# Patient Record
Sex: Female | Born: 1970
Health system: Southern US, Community
[De-identification: ages and names within clinical notes are randomized; demographics above are authoritative.]

## PROBLEM LIST (undated history)

## (undated) DIAGNOSIS — E039 Hypothyroidism, unspecified: Secondary | ICD-10-CM

## (undated) DIAGNOSIS — E119 Type 2 diabetes mellitus without complications: Secondary | ICD-10-CM

## (undated) DIAGNOSIS — E079 Disorder of thyroid, unspecified: Secondary | ICD-10-CM

## (undated) DIAGNOSIS — I10 Essential (primary) hypertension: Secondary | ICD-10-CM

## (undated) DIAGNOSIS — K769 Liver disease, unspecified: Secondary | ICD-10-CM

## (undated) HISTORY — DX: Type 2 diabetes mellitus without complications: E11.9

## (undated) HISTORY — DX: Essential (primary) hypertension: I10

## (undated) HISTORY — DX: Disorder of thyroid, unspecified: E07.9

---

## 1996-10-10 HISTORY — PX: OTHER SURGICAL HISTORY: SHX169

## 2007-05-10 DIAGNOSIS — E039 Hypothyroidism, unspecified: Secondary | ICD-10-CM | POA: Insufficient documentation

## 2009-02-27 DIAGNOSIS — K760 Fatty (change of) liver, not elsewhere classified: Secondary | ICD-10-CM | POA: Insufficient documentation

## 2009-07-28 DIAGNOSIS — J301 Allergic rhinitis due to pollen: Secondary | ICD-10-CM | POA: Insufficient documentation

## 2010-01-11 ENCOUNTER — Ambulatory Visit: Payer: Self-pay | Admitting: Family Medicine

## 2010-01-11 DIAGNOSIS — S9030XA Contusion of unspecified foot, initial encounter: Secondary | ICD-10-CM | POA: Insufficient documentation

## 2016-01-15 ENCOUNTER — Ambulatory Visit
Admission: RE | Admit: 2016-01-15 | Discharge: 2016-01-15 | Disposition: A | Discharge status: Home / Self Care | Payer: Managed Care, Other (non HMO) | Source: Ambulatory Visit | Attending: Physician Assistant | Admitting: Physician Assistant

## 2016-01-15 ENCOUNTER — Ambulatory Visit (INDEPENDENT_AMBULATORY_CARE_PROVIDER_SITE_OTHER): Payer: Managed Care, Other (non HMO) | Admitting: Physician Assistant

## 2016-01-15 ENCOUNTER — Encounter: Payer: Self-pay | Admitting: Physician Assistant

## 2016-01-15 ENCOUNTER — Telehealth: Payer: Self-pay

## 2016-01-15 VITALS — BP 130/80 | HR 70 | Temp 98.1°F | Resp 16 | Wt 279.4 lb

## 2016-01-15 DIAGNOSIS — E039 Hypothyroidism, unspecified: Secondary | ICD-10-CM

## 2016-01-15 DIAGNOSIS — R1030 Lower abdominal pain, unspecified: Secondary | ICD-10-CM

## 2016-01-15 DIAGNOSIS — E559 Vitamin D deficiency, unspecified: Secondary | ICD-10-CM | POA: Insufficient documentation

## 2016-01-15 DIAGNOSIS — R0683 Snoring: Secondary | ICD-10-CM | POA: Insufficient documentation

## 2016-01-15 NOTE — Progress Notes (Signed)
Patient: Kristin Chavez Female    DOB: 11/09/1970   45 y.o.   MRN: [id number] Visit Date: 01/15/2016  Today's Provider: Margaretann Loveless, PA-C   Chief Complaint  Patient presents with  . Abdominal Pain   Subjective:    Abdominal Pain This is a new problem. The current episode started more than 1 month ago. The problem occurs every several days. Duration: Yesterday it started at 10 pm until 3 am. The problem has been gradually worsening. The pain is located in the epigastric region, RLQ and LLQ (Most of the time is on the lower abdomen but yesterday it radiated to her epigastric region). The pain is at a severity of 6/10. The pain is moderate. The quality of the pain is cramping (feel Bloated and when it went to the Gi area it was burning). The abdominal pain radiates to the back, epigastric region, RLQ and LLQ. Associated symptoms include belching and nausea (during the spells). Pertinent negatives include no constipation, diarrhea, fever or vomiting. Nothing aggravates the pain. Relieved by: Just a warm shower. She has tried antacids for the symptoms. The treatment provided no relief.  Per patient she had this pains and was told she had a fatty liver and that she needed to change her diet. She is not doing a lot of carbs or processed food. Her diet consist of more healthy foods. She is doing a vegan shakes. She is exercising 6 days a week 30-60 minutes mostly cardio. She has lost 25 lbs in the last two months. She had been doing whey protein but was having similar symptoms in the morning instead of evening so she discontinued whey shakes and started vegan shakes.      Allergies no known allergies Previous Medications   LEVOTHYROXINE (SYNTHROID, LEVOTHROID) 200 MCG TABLET    Take by mouth.    Review of Systems  Constitutional: Positive for chills. Negative for fever.  Respiratory: Negative.   Cardiovascular: Negative for chest pain, palpitations and leg swelling.    Gastrointestinal: Positive for nausea (during the spells), abdominal pain and abdominal distention. Negative for vomiting, diarrhea and constipation.  Neurological: Negative.     Social History  Substance Use Topics  . Smoking status: Never Smoker   . Smokeless tobacco: Not on file  . Alcohol Use: No   Objective:   BP 130/80 mmHg  Pulse 70  Temp(Src) 98.1 F (36.7 C) (Oral)  Resp 16  Wt 279 lb 6.4 oz (126.735 kg)  LMP 12/30/2015  Physical Exam  Constitutional: She is oriented to person, place, and time. She appears well-developed and well-nourished. No distress.  Cardiovascular: Normal rate, regular rhythm and normal heart sounds.  Exam reveals no gallop and no friction rub.   No murmur heard. Pulmonary/Chest: Effort normal and breath sounds normal. No respiratory distress. She has no wheezes. She has no rales.  Abdominal: Soft. Normal appearance and bowel sounds are normal. She exhibits no distension and no mass. There is no hepatosplenomegaly. There is tenderness in the right lower quadrant and left lower quadrant. There is no rebound, no guarding, no CVA tenderness, no tenderness at McBurney's point and negative Caton's sign.  Neurological: She is alert and oriented to person, place, and time.  Skin: Skin is warm and dry. She is not diaphoretic.        Assessment & Plan:     1. Lower abdominal pain Will check labs as below. Will f/u pending lab results. Will also get  xray to check for stool burden as cause and will f/u pending results. Advised to start keeping a log of when an attack happens with what she ate that day to see if any foods correlate to symptoms. I do feel it is diet related since symptoms onset with drastic diet changes. Will f/u in 2-4 weeks. She is to call if symptoms worsen. - CBC with Differential - Comprehensive Metabolic Panel (CMET) - Amylase - Lipase - DG Abd 2 Views; Future  2. Hypothyroidism, unspecified hypothyroidism type Will recheck TSH  since it has not been checked in a while. Will f/u pending results.  - TSH       Margaretann LovelessJennifer M Burnette, PA-C  [facility]

## 2016-01-15 NOTE — Telephone Encounter (Signed)
LMTCB  Thanks,  -Jerrika Ledlow 

## 2016-01-15 NOTE — Telephone Encounter (Signed)
Patient advised as directed below.  Thanks,  -Zonia Caplin 

## 2016-01-15 NOTE — Telephone Encounter (Signed)
-----   Message from Margaretann LovelessJennifer M Burnette, [place]PA-C sent at 01/15/2016  1:54 PM EDT ----- There is a moderate stool burden noted throughout the colon indicating constipation. This may or may not be the cause of the abdominal pain. Over the next 2 weeks I want you to use miralax daily as directed on the bottle directions. Continue to keep a log as we have discussed on foods during attack. I should have labs back tomorrow and will notify of those results. I will see you back in 2 weeks.

## 2016-01-15 NOTE — Patient Instructions (Signed)

## 2016-01-15 NOTE — Telephone Encounter (Signed)
Pt returned call. Pt already has her 2 week f/u appt scheduled for 01/29/16. Thanks TNP

## 2016-01-16 ENCOUNTER — Emergency Department
Admission: EM | Admit: 2016-01-16 | Discharge: 2016-01-16 | Disposition: A | Discharge status: Home / Self Care | Payer: Managed Care, Other (non HMO) | Attending: Emergency Medicine | Admitting: Emergency Medicine

## 2016-01-16 ENCOUNTER — Encounter: Payer: Self-pay | Admitting: Emergency Medicine

## 2016-01-16 DIAGNOSIS — R1084 Generalized abdominal pain: Secondary | ICD-10-CM | POA: Diagnosis not present

## 2016-01-16 DIAGNOSIS — K760 Fatty (change of) liver, not elsewhere classified: Secondary | ICD-10-CM | POA: Diagnosis not present

## 2016-01-16 DIAGNOSIS — E039 Hypothyroidism, unspecified: Secondary | ICD-10-CM | POA: Insufficient documentation

## 2016-01-16 DIAGNOSIS — R109 Unspecified abdominal pain: Secondary | ICD-10-CM | POA: Diagnosis present

## 2016-01-16 LAB — COMPREHENSIVE METABOLIC PANEL
ALK PHOS: 60 IU/L (ref 39–117)
ALT: 19 IU/L (ref 0–32)
ALT: 20 U/L (ref 14–54)
AST: 15 IU/L (ref 0–40)
AST: 19 U/L (ref 15–41)
Albumin/Globulin Ratio: 1.6 (ref 1.2–2.2)
Albumin: 4.4 g/dL (ref 3.5–5.5)
Albumin: 4 g/dL (ref 3.5–5.0)
Alkaline Phosphatase: 49 U/L (ref 38–126)
Anion gap: 5 (ref 5–15)
BUN / CREAT RATIO: 16 (ref 9–23)
BUN: 11 mg/dL (ref 6–24)
BUN: 15 mg/dL (ref 6–20)
Bilirubin Total: 0.5 mg/dL (ref 0.0–1.2)
CHLORIDE: 101 mmol/L (ref 96–106)
CHLORIDE: 106 mmol/L (ref 101–111)
CO2: 26 mmol/L (ref 18–29)
CO2: 26 mmol/L (ref 22–32)
CREATININE: 0.68 mg/dL (ref 0.57–1.00)
CREATININE: 0.73 mg/dL (ref 0.44–1.00)
Calcium: 9.6 mg/dL (ref 8.7–10.2)
Calcium: 9.2 mg/dL (ref 8.9–10.3)
GFR calc Af Amer: 122 mL/min/{1.73_m2} (ref >59)
GFR calc non Af Amer: 106 mL/min/{1.73_m2} (ref >59)
GFR calc non Af Amer: >60 mL/min (ref >60)
GLUCOSE: 99 mg/dL (ref 65–99)
GLUCOSE: 118 mg/dL — AB (ref 65–99)
Globulin, Total: 2.7 g/dL (ref 1.5–4.5)
Potassium: 5.3 mmol/L — ABNORMAL HIGH (ref 3.5–5.2)
Potassium: 4.1 mmol/L (ref 3.5–5.1)
Sodium: 141 mmol/L (ref 134–144)
Sodium: 137 mmol/L (ref 135–145)
Total Bilirubin: 0.8 mg/dL (ref 0.3–1.2)
Total Protein: 7.1 g/dL (ref 6.0–8.5)
Total Protein: 7.3 g/dL (ref 6.5–8.1)

## 2016-01-16 LAB — CBC WITH DIFFERENTIAL/PLATELET
BASOS ABS: 0.1 10*3/uL (ref 0.0–0.2)
Basos: 1 %
EOS (ABSOLUTE): 0.8 10*3/uL — ABNORMAL HIGH (ref 0.0–0.4)
Eos: 10 %
Hematocrit: 37.9 % (ref 34.0–46.6)
Hemoglobin: 12.2 g/dL (ref 11.1–15.9)
Immature Grans (Abs): 0 10*3/uL (ref 0.0–0.1)
Immature Granulocytes: 0 %
LYMPHS ABS: 2.1 10*3/uL (ref 0.7–3.1)
Lymphs: 27 %
MCH: 26.8 pg (ref 26.6–33.0)
MCHC: 32.2 g/dL (ref 31.5–35.7)
MCV: 83 fL (ref 79–97)
MONOCYTES: 7 %
MONOS ABS: 0.6 10*3/uL (ref 0.1–0.9)
Neutrophils Absolute: 4.4 10*3/uL (ref 1.4–7.0)
Neutrophils: 55 %
Platelets: 332 10*3/uL (ref 150–379)
RBC: 4.56 x10E6/uL (ref 3.77–5.28)
RDW: 15 % (ref 12.3–15.4)
WBC: 8 10*3/uL (ref 3.4–10.8)

## 2016-01-16 LAB — CBC
HCT: 35 % (ref 35.0–47.0)
Hemoglobin: 11.6 g/dL — ABNORMAL LOW (ref 12.0–16.0)
MCH: 26.9 pg (ref 26.0–34.0)
MCHC: 33 g/dL (ref 32.0–36.0)
MCV: 81.5 fL (ref 80.0–100.0)
Platelets: 271 10*3/uL (ref 150–440)
RBC: 4.3 MIL/uL (ref 3.80–5.20)
RDW: 14.8 % — ABNORMAL HIGH (ref 11.5–14.5)
WBC: 9.2 10*3/uL (ref 3.6–11.0)

## 2016-01-16 LAB — TSH: TSH: 3.66 u[IU]/mL (ref 0.450–4.500)

## 2016-01-16 LAB — URINALYSIS COMPLETE WITH MICROSCOPIC (ARMC ONLY)
Bacteria, UA: NONE SEEN
Bilirubin Urine: NEGATIVE
Glucose, UA: NEGATIVE mg/dL
Hgb urine dipstick: NEGATIVE
Leukocytes, UA: NEGATIVE
Nitrite: NEGATIVE
PH: 7 (ref 5.0–8.0)
PROTEIN: NEGATIVE mg/dL
RBC / HPF: NONE SEEN RBC/hpf (ref 0–5)
Specific Gravity, Urine: 1.024 (ref 1.005–1.030)

## 2016-01-16 LAB — LIPASE: Lipase: 17 U/L (ref 0–59)

## 2016-01-16 LAB — AMYLASE: AMYLASE: 24 U/L — AB (ref 31–124)

## 2016-01-16 LAB — LIPASE, BLOOD: Lipase: 17 U/L (ref 11–51)

## 2016-01-16 MED ORDER — OXYCODONE-ACETAMINOPHEN 5-325 MG PO TABS
1.0000 | ORAL_TABLET | ORAL | Status: DC | PRN
  Administered 2016-01-16: 1 via ORAL

## 2016-01-16 MED ORDER — DICYCLOMINE HCL 20 MG PO TABS: 20.0000 mg | ORAL_TABLET | Freq: Three times a day (TID) | ORAL | Status: DC | PRN

## 2016-01-16 MED ORDER — OXYCODONE-ACETAMINOPHEN 5-325 MG PO TABS
ORAL_TABLET | ORAL | Status: AC
  Filled 2016-01-16: qty 1

## 2016-01-16 NOTE — ED Provider Notes (Signed)
[facility] Emergency Department Provider Note  ____________________________________________  Time seen: 6:50 PM  I have reviewed the triage vital signs and the nursing notes.   HISTORY  Chief Complaint Abdominal Pain    HPI Kristin Chavez is a 45 y.o. female who complains of episodes of generalized abdominal pain for the past 6 weeks. They usually happen about every other day. Last for 5 or 6 hours at a time. Also some nausea but no vomiting or diarrhea. No chest pain or shortness of breath. No fevers or chills. Doesn't seem to be related to eating. 2 months ago she drastically changed her diet cut out all process fluids and carbohydrates and sugar and has been having a diet high on fruits and vegetables. Makes herself special shakes in the morning with protein powder and take some vitamin supplements.     Past Medical History  Diagnosis Date  . Thyroid disease      Patient Active Problem List   Diagnosis Date Noted  . Snores 01/15/2016  . Avitaminosis D 01/15/2016  . Contusion of foot 01/11/2010  . Hay fever 07/28/2009  . Fatty metamorphosis of liver 02/27/2009  . Adiposity 07/11/2008  . Adult hypothyroidism 05/10/2007     Past Surgical History  Procedure Laterality Date  . Hx of laparoscopy  1998     Current Outpatient Rx  Name  Route  Sig  Dispense  Refill  . dicyclomine (BENTYL) 20 MG tablet   Oral   Take 1 tablet (20 mg total) by mouth 3 (three) times daily as needed (for abdominal pain).   30 tablet   0   . levothyroxine (SYNTHROID, LEVOTHROID) 200 MCG tablet   Oral   Take by mouth.            Allergies Review of patient's allergies indicates no known allergies.   Family History  Problem Relation Age of Onset  . Hypertension Mother   . Cancer Paternal Grandmother     Social History Social History  Substance Use Topics  . Smoking status: Never Smoker   . Smokeless tobacco: None  . Alcohol Use: No    Review  of Systems  Constitutional:   No fever or chills. No weight changes Eyes:   No vision changes.  ENT:   No sore throat. No rhinorrhea. Cardiovascular:   No chest pain. Respiratory:   No dyspnea or cough. Gastrointestinal:   Generalized abdominal pain without vomiting and diarrhea.  No BRBPR or melena. Genitourinary:   Negative for dysuria or difficulty urinating. Musculoskeletal:   Negative for focal pain or swelling Skin:   Negative for rash. Neurological:   Negative for headaches, focal weakness or numbness.  10-point ROS otherwise negative.  ____________________________________________   PHYSICAL EXAM:  VITAL SIGNS: ED Triage Vitals  Enc Vitals Group     BP 01/16/16 1557 167/39 mmHg     Pulse Rate 01/16/16 1557 72     Resp 01/16/16 1557 18     Temp 01/16/16 1557 97.9 F (36.6 C)     Temp Source 01/16/16 1557 Oral     SpO2 01/16/16 1557 100 %     Weight 01/16/16 1557 275 lb (124.739 kg)     Height 01/16/16 1557 5\' 6"  (1.676 m)     Head Cir --      Peak Flow --      Pain Score 01/16/16 1558 7     Pain Loc --      Pain Edu? --  Excl. in GC? --     Vital signs reviewed, nursing assessments reviewed.   Constitutional:   Alert and oriented. Well appearing and in no distress. Eyes:   No scleral icterus. No conjunctival pallor. PERRL. EOMI ENT   Head:   Normocephalic and atraumatic.   Nose:   No congestion/rhinnorhea. No septal hematoma   Mouth/Throat:   MMM, no pharyngeal erythema. No peritonsillar mass.    Neck:   No stridor. No SubQ emphysema. No meningismus. Hematological/Lymphatic/Immunilogical:   No cervical lymphadenopathy. Cardiovascular:   RRR. Symmetric bilateral radial and DP pulses.  No murmurs.  Respiratory:   Normal respiratory effort without tachypnea nor retractions. Breath sounds are clear and equal bilaterally. No wheezes/rales/rhonchi. Gastrointestinal:   Soft and nontender. Non distended. There is no CVA tenderness.  No rebound,  rigidity, or guarding. Genitourinary:   deferred Musculoskeletal:   Nontender with normal range of motion in all extremities. No joint effusions.  No lower extremity tenderness.  No edema. Neurologic:   Normal speech and language.  CN 2-10 normal. Motor grossly intact. No gross focal neurologic deficits are appreciated.  Skin:    Skin is warm, dry and intact. No rash noted.  No petechiae, purpura, or bullae. Psychiatric:   Mood and affect are normal. ____________________________________________    LABS (pertinent positives/negatives) (all labs ordered are listed, but only abnormal results are displayed) Labs Reviewed  COMPREHENSIVE METABOLIC PANEL - Abnormal; Notable for the following:    Glucose, Bld 118 (*)    All other components within normal limits  CBC - Abnormal; Notable for the following:    Hemoglobin 11.6 (*)    RDW 14.8 (*)    All other components within normal limits  URINALYSIS COMPLETEWITH MICROSCOPIC ([facility] ONLY) - Abnormal; Notable for the following:    Color, Urine YELLOW (*)    APPearance CLEAR (*)    Ketones, ur 1+ (*)    Squamous Epithelial / LPF 0-5 (*)    All other components within normal limits  LIPASE, BLOOD   ____________________________________________   EKG    ____________________________________________    RADIOLOGY    ____________________________________________   PROCEDURES   ____________________________________________   INITIAL IMPRESSION / ASSESSMENT AND PLAN / ED COURSE  Pertinent labs & imaging results that were available during my care of the patient were reviewed by me and considered in my medical decision making (see chart for details).  Patient presents with generalized abdominal pain episodes. This is chronic in nature and seems to be temporally related to a drastic change in her diet. It may be that the high-fiber load is to go for her GI system to adjust to.Considering the patient's symptoms, medical history, and  physical examination today, I have low suspicion for cholecystitis or biliary pathology, pancreatitis, perforation or bowel obstruction, hernia, intra-abdominal abscess, AAA or dissection, volvulus or intussusception, mesenteric ischemia, or appendicitis.  Labs completely unremarkable. Vital signs unremarkable, exam is benign and reassuring. A prescribe her Bentyl to take as needed for cramping pain, follow-up with primary care, counseled on elimination diet starting with removing all nutritional supplements and protein powders and vitamins.     ____________________________________________   FINAL CLINICAL IMPRESSION(S) / ED DIAGNOSES  Final diagnoses:  Generalized abdominal pain      Sharman Cheek, MD 01/16/16 1925

## 2016-01-16 NOTE — ED Notes (Signed)
[facility] with family: Patient has been having intermittent starting in the lower abdomen with a burning sensation to the mid abdomen and pain across mid back, with 'excessive burping'. Patient states this has been going on for 6 weeks and has been seen by her PCP with subsequent plain film DG ABD xrays. PCP informed her that she had a buildup of stool and began a miralax regimen.

## 2016-01-16 NOTE — ED Notes (Signed)
Pt reports pain medicine has not relieved her pain

## 2016-01-16 NOTE — Discharge Instructions (Signed)

## 2016-01-18 ENCOUNTER — Telehealth: Payer: Self-pay

## 2016-01-18 DIAGNOSIS — E039 Hypothyroidism, unspecified: Secondary | ICD-10-CM

## 2016-01-18 MED ORDER — LEVOTHYROXINE SODIUM 200 MCG PO TABS: 200.0000 ug | ORAL_TABLET | Freq: Every day | ORAL | Status: DC

## 2016-01-18 NOTE — Telephone Encounter (Signed)
Patient advised as directed below.  Thanks,  -Kebrina Friend 

## 2016-01-18 NOTE — Telephone Encounter (Signed)
Patient advised as directed below. Per patient went to the ER this weekend. Also wants to know if she still needs to keep taking her Levothyroxine since her labs were normal and if yes she needs another prescription. Please advise.  Thanks,  -Vaughan Garfinkle

## 2016-01-18 NOTE — Telephone Encounter (Signed)
-----   Message from Margaretann LovelessJennifer M Burnette, PA-C sent at 01/18/2016  8:28 AM EDT ----- All labs are within normal limits and stable.  Thanks! -JB

## 2016-01-18 NOTE — Telephone Encounter (Signed)
Yes she needs to continue levothyroxine and this was refilled. I read her note from her ER visit. Eliminate items as directed and f/u with me as previously scheduled especially if symptoms continue.

## 2016-01-29 ENCOUNTER — Encounter: Payer: Self-pay | Admitting: Physician Assistant

## 2016-01-29 ENCOUNTER — Ambulatory Visit (INDEPENDENT_AMBULATORY_CARE_PROVIDER_SITE_OTHER): Payer: Managed Care, Other (non HMO) | Admitting: Physician Assistant

## 2016-01-29 VITALS — BP 142/90 | HR 71 | Temp 98.7°F | Resp 16 | Wt 275.2 lb

## 2016-01-29 DIAGNOSIS — K760 Fatty (change of) liver, not elsewhere classified: Secondary | ICD-10-CM

## 2016-01-29 DIAGNOSIS — R1030 Lower abdominal pain, unspecified: Secondary | ICD-10-CM

## 2016-01-29 NOTE — Progress Notes (Signed)
Patient ID: Kristin Chavez Iezzi, female   DOB: 07/16/1971, 45 y.o.   MRN: [id number]   Patient: Kristin Chavez [place]in Female    DOB: 07/16/1971   45 y.o.   MRN: [id number] Visit Date: 01/29/2016  Today's Provider: Margaretann LovelessJennifer M Ismeal Heider, PA-C   Chief Complaint  Patient presents with  . Abdominal Pain  . Follow-up   Subjective:    HPI  Kristin RiddleHolly Chavez is a 45 yr old female that returns today for f/u of her lower abdominal pain. Since previous visit she was also seen in the ER on 01/16/16 and it was felt that her abdominal pain was secondary to dietary changes including a high-fiber, high-protein diet.  She was given bentyl for the abdominal pain. Abdominal xray was positive for stool and possible phleboliths. Otherwise unremarkable.  The current episode started more than 2 months ago. The problem occurs every several days. When the episodes happen they last anywhere from 30 minutes to hours. The worst one lasted 8 hours and that is the one she went to the ER for. The problem has been gradually improving (intensity but not frequency) with discontinuing all supplements and protein shakes.. The pain is located in the lower abdomen mostly, but occasionally has radiated to epigastric region. . The pain is at a severity of 6/10 at its worst recently. The quality of the pain is cramping and bloated.  When it radiates to the epigastric region it takes on a burning quality. The abdominal pain radiates to the back, epigastric region, RLQ and LLQ. Associated symptoms include belching and nausea (during the spells). Pertinent negatives include no constipation, diarrhea, fever or vomiting. Nothing aggravates the pain. Relieved by: A warm shower and bentyl when needed. She has tried antacids for the symptoms as well that provided no relief.   A while back she was having abdominal pain similar in quality, but not as severe.  She had an abdominal US and was told she had a fatty liver and that she needed to change her diet. She is not  doing a lot of carbs or processed food. Her diet consist of more healthy foods. She is doing a vegan shakes. She is exercising 6 days a week 30-60 minutes mostly cardio. She has lost 25 lbs in the last two months. She had been doing whey protein but was having similar symptoms in the morning instead of evening so she discontinued whey shakes and started vegan shakes. She is currently not doing any supplements and noticed a decrease in severity but abdominal pain and cramping episodes still occur.   She does report a history of IBS in her sister, but that was when her sister was in her late teen years. No issues since. She does report a slight increase in stress since November because of her 2 sons. Otherwise no changes.  She also has pertinent family history of her father having pre-cancerous polyps removed during a colonoscopy. Of note also she only recently restarted her levothyroxine back as well.    Previous Medications   DICYCLOMINE (BENTYL) 20 MG TABLET    Take 1 tablet (20 mg total) by mouth 3 (three) times daily as needed (for abdominal pain).   LEVOTHYROXINE (SYNTHROID, LEVOTHROID) 200 MCG TABLET    Take 1 tablet (200 mcg total) by mouth daily before breakfast.   No Known Allergies  Review of Systems  Constitutional: Negative for fever, chills, activity change, appetite change, fatigue and unexpected weight change.  HENT: Negative.   Respiratory: Negative.  Cardiovascular: Negative.   Gastrointestinal: Positive for nausea (during episode), abdominal pain (lower abdomen mostly), diarrhea and constipation (occasional; flips between diarrhea and constipation; had loose stool this a.m.). Negative for vomiting, blood in stool, anal bleeding and rectal pain.  Genitourinary: Negative.   Musculoskeletal: Positive for back pain (during episode).  Neurological: Negative.   Psychiatric/Behavioral: Negative.     Social History  Substance Use Topics  . Smoking status: Never Smoker   .  Smokeless tobacco: Not on file  . Alcohol Use: No   Objective:   BP 142/90 mmHg  Pulse 71  Temp(Src) 98.7 F (37.1 C) (Oral)  Resp 16  Wt 275 lb 3.2 oz (124.83 kg)  LMP 12/30/2015  Physical Exam  Constitutional: She appears well-developed and well-nourished. No distress.  Neck: Normal range of motion. Neck supple. No tracheal deviation present. No thyromegaly present.  Cardiovascular: Normal rate, regular rhythm and normal heart sounds.  Exam reveals no gallop and no friction rub.   No murmur heard. Pulmonary/Chest: Effort normal and breath sounds normal. No respiratory distress. She has no wheezes. She has no rales.  Abdominal: Soft. Bowel sounds are normal. She exhibits no distension and no mass. There is no tenderness. There is no rebound and no guarding.  Lymphadenopathy:    She has no cervical adenopathy.  Skin: She is not diaphoretic.  Vitals reviewed.       Assessment & Plan:     1. Lower abdominal pain Continue Bentyl during flares and avoiding nutritional supplements and shakes. Will refer to GI for further evaluation of abdominal pain and fatty liver. Will follow up after GI appointment. She is to call if symptoms worsen before appointment or if she develops any acute issue, question or concern. - Ambulatory referral to Gastroenterology  2. Fatty metamorphosis of liver See above medical treatment plan. - Ambulatory referral to Gastroenterology   Follow up: No Follow-up on file.

## 2016-01-29 NOTE — Patient Instructions (Signed)
Abdominal Pain, Adult °Many things can cause abdominal pain. Usually, abdominal pain is not caused by a disease and will improve without treatment. It can often be observed and treated at home. Your health care provider will do a physical exam and possibly order blood tests and X-rays to help determine the seriousness of your pain. However, in many cases, more time must pass before a clear cause of the pain can be found. Before that point, your health care provider may not know if you need more testing or further treatment. °HOME CARE INSTRUCTIONS °Monitor your abdominal pain for any changes. The following actions may help to alleviate any discomfort you are experiencing: °· Only take over-the-counter or prescription medicines as directed by your health care provider. °· Do not take laxatives unless directed to do so by your health care provider. °· Try a clear liquid diet (broth, tea, or water) as directed by your health care provider. Slowly move to a bland diet as tolerated. °SEEK MEDICAL CARE IF: °· You have unexplained abdominal pain. °· You have abdominal pain associated with nausea or diarrhea. °· You have pain when you urinate or have a bowel movement. °· You experience abdominal pain that wakes you in the night. °· You have abdominal pain that is worsened or improved by eating food. °· You have abdominal pain that is worsened with eating fatty foods. °· You have a fever. °SEEK IMMEDIATE MEDICAL CARE IF: °· Your pain does not go away within 2 hours. °· You keep throwing up (vomiting). °· Your pain is felt only in portions of the abdomen, such as the right side or the left lower portion of the abdomen. °· You pass bloody or black tarry stools. °MAKE SURE YOU: °· Understand these instructions. °· Will watch your condition. °· Will get help right away if you are not doing well or get worse. °  °This information is not intended to replace advice given to you by your health care provider. Make sure you discuss  any questions you have with your health care provider. °  °Document Released: 07/06/2005 Document Revised: 06/17/2015 Document Reviewed: 06/05/2013 °Elsevier Interactive Patient Education ©2016 [employer] ° °Fatty Liver °Fatty liver, also called hepatic steatosis or steatohepatitis, is a condition in which too much fat has built up in your liver cells. The liver removes harmful substances from your bloodstream. It produces fluids your body needs. It also helps your body use and store energy from the food you eat. °In many cases, fatty liver does not cause symptoms or problems. It is often diagnosed when tests are being done for other reasons. However, over time, fatty liver can cause inflammation that may lead to more serious liver problems, such as scarring of the liver (cirrhosis). °CAUSES  °Causes of fatty liver may include:  °· Drinking too much alcohol. °· Poor nutrition. °· Obesity. °· Cushing syndrome. °· Diabetes. °· Hyperlipidemia. °· Pregnancy. °· Certain drugs. °· Poisons. °· Some viral infections. °RISK FACTORS °You may be more likely to develop fatty liver if you: °· Abuse alcohol. °· Are pregnant. °· Are overweight. °· Have diabetes. °· Have hepatitis. °· Have a high triglyceride level.   °SIGNS AND SYMPTOMS  °Fatty liver often does not cause any symptoms. In cases where symptoms develop, they can include: °· Fatigue. °· Weakness. °· Weight loss. °· Confusion.   °· Abdominal pain. °· Yellowing of your skin and the white parts of your eyes (jaundice). °· Nausea and vomiting. °DIAGNOSIS  °Fatty liver may be diagnosed   by:  °· Physical exam and medical history. °· Blood tests. °· Imaging tests, such as an ultrasound, CT scan, or MRI. °· Liver biopsy. A small sample of liver tissue is removed using a needle. The sample is then looked at under a microscope. °TREATMENT  °Fatty liver is often caused by other health conditions. Treatment for fatty liver may involve medicines and lifestyle changes to manage  conditions such as:  °· Alcoholism. °· High cholesterol. °· Diabetes. °· Being overweight or obese.   °HOME CARE INSTRUCTIONS °· Eat a healthy diet as directed by your health care provider. °· Exercise regularly. This can help you lose weight and control your cholesterol and diabetes. Talk to your health care provider about an exercise plan and which activities are best for you. °· Do not drink alcohol.   °· Take medicines only as directed by your health care provider. °SEEK MEDICAL CARE IF: °You have difficulty controlling your: °· Blood sugar. °· Cholesterol. °· Alcohol consumption. °SEEK IMMEDIATE MEDICAL CARE IF: °· You have abdominal pain. °· You have jaundice. °· You have nausea and vomiting. °  °This information is not intended to replace advice given to you by your health care provider. Make sure you discuss any questions you have with your health care provider. °  °Document Released: 11/11/2005 Document Revised: 10/17/2014 Document Reviewed: 02/05/2014 °Elsevier Interactive Patient Education ©2016 Elsevier Inc. ° °

## 2016-02-29 ENCOUNTER — Ambulatory Visit (INDEPENDENT_AMBULATORY_CARE_PROVIDER_SITE_OTHER): Payer: Managed Care, Other (non HMO) | Admitting: Physician Assistant

## 2016-02-29 ENCOUNTER — Encounter: Payer: Self-pay | Admitting: Physician Assistant

## 2016-02-29 VITALS — BP 122/80 | HR 85 | Temp 98.0°F | Resp 16 | Wt 266.4 lb

## 2016-02-29 DIAGNOSIS — L0292 Furuncle, unspecified: Secondary | ICD-10-CM | POA: Diagnosis not present

## 2016-02-29 MED ORDER — CEPHALEXIN 500 MG PO CAPS: 500.0000 mg | ORAL_CAPSULE | Freq: Two times a day (BID) | ORAL | Status: DC

## 2016-02-29 NOTE — Progress Notes (Signed)
Patient: Kristin Chavez Female    DOB: May 06, 1971   45 y.o.   MRN: [id number] Visit Date: 02/29/2016  Today's Provider: Margaretann LovelessJennifer M Burnette, PA-C   Chief Complaint  Patient presents with  . Sore on stomach   Subjective:    HPI   Kristin Chavez is here today with c/o of a sore in her lower right abdomen and swollen lymph node in the right groin. She notice the area 1 week ago. She reports that it now is bigger, purple with redness around it. Is not tender but it feels like it burns,warm to touch. No fever, nausea or vomiting. She has been putting warm compresses on it in the morning and at night for two days. Last night she put warm compress and noticed a little drainage this morning.  Drainage was purulent and clear without odor. She also mentions the lymph node that she had noticed is smaller today as well.  Of note she does report her stomach issues are slightly improved. She uses the bentyl to relieve the cramping. She states when she has an episode it does take 2-3 bentyl to calm it down but the episodes are less frequent. She is scheduled for a colonoscopy tomorrow. She does notice a correlation to red meats and abdominal cramping and diarrhea.     No Known Allergies Previous Medications   DICYCLOMINE (BENTYL) 20 MG TABLET    Take 1 tablet (20 mg total) by mouth 3 (three) times daily as needed (for abdominal pain).   LEVOTHYROXINE (SYNTHROID, LEVOTHROID) 200 MCG TABLET    Take 1 tablet (200 mcg total) by mouth daily before breakfast.    Review of Systems  Constitutional: Negative.   Respiratory: Negative.   Cardiovascular: Negative for chest pain, palpitations and leg swelling.  Gastrointestinal: Negative.   Musculoskeletal: Negative.   Skin: Positive for wound.    Social History  Substance Use Topics  . Smoking status: Never Smoker   . Smokeless tobacco: Not on file  . Alcohol Use: No   Objective:   BP 122/80 mmHg  Pulse 85  Temp(Src) 98 F (36.7 C) (Oral)   Resp 16  Wt 266 lb 6.4 oz (120.838 kg)  Physical Exam  Constitutional: She appears well-developed and well-nourished. No distress.  Neck: Normal range of motion. Neck supple.  Cardiovascular: Normal rate, regular rhythm and normal heart sounds.  Exam reveals no gallop and no friction rub.   No murmur heard. Pulmonary/Chest: Effort normal and breath sounds normal. No respiratory distress. She has no wheezes. She has no rales.  Abdominal: Soft. Bowel sounds are normal. She exhibits no distension and no mass. There is no tenderness. There is no rebound and no guarding.  Lymphadenopathy:    She has no cervical adenopathy.  Skin: Skin is warm and dry. Lesion noted. She is not diaphoretic.     Vitals reviewed.       Assessment & Plan:     1. Boil Discussed I&D but because it is auto-draining slightly will start with keflex first. Advised to continue warm compresses and epsom salt soaks. She is to call if it does not improve or if it worsens and becomes tender to touch and we will I&D at that time. She agrees. - cephALEXin (KEFLEX) 500 MG capsule; Take 1 capsule (500 mg total) by mouth 2 (two) times daily.  Dispense: 20 capsule; Refill: 0       Margaretann LovelessJennifer M Burnette, PA-C  [facility]

## 2016-02-29 NOTE — Patient Instructions (Signed)

## 2016-03-31 ENCOUNTER — Encounter: Payer: Self-pay | Admitting: *Deleted

## 2016-04-01 ENCOUNTER — Encounter: Payer: Self-pay | Admitting: *Deleted

## 2016-04-01 ENCOUNTER — Encounter
Admission: RE | Disposition: A | Discharge status: Home / Self Care | Payer: Self-pay | Source: Ambulatory Visit | Attending: Gastroenterology

## 2016-04-01 ENCOUNTER — Ambulatory Visit
Admission: RE | Admit: 2016-04-01 | Discharge: 2016-04-01 | Disposition: A | Discharge status: Home / Self Care | Payer: Managed Care, Other (non HMO) | Source: Ambulatory Visit | Attending: Gastroenterology | Admitting: Gastroenterology

## 2016-04-01 ENCOUNTER — Ambulatory Visit: Payer: Managed Care, Other (non HMO) | Admitting: Certified Registered"

## 2016-04-01 DIAGNOSIS — E039 Hypothyroidism, unspecified: Secondary | ICD-10-CM | POA: Diagnosis not present

## 2016-04-01 DIAGNOSIS — K635 Polyp of colon: Secondary | ICD-10-CM | POA: Insufficient documentation

## 2016-04-01 DIAGNOSIS — Z79899 Other long term (current) drug therapy: Secondary | ICD-10-CM | POA: Diagnosis not present

## 2016-04-01 DIAGNOSIS — R1030 Lower abdominal pain, unspecified: Secondary | ICD-10-CM | POA: Diagnosis not present

## 2016-04-01 DIAGNOSIS — R194 Change in bowel habit: Secondary | ICD-10-CM | POA: Diagnosis not present

## 2016-04-01 DIAGNOSIS — Z8371 Family history of colonic polyps: Secondary | ICD-10-CM | POA: Insufficient documentation

## 2016-04-01 HISTORY — DX: Liver disease, unspecified: K76.9

## 2016-04-01 HISTORY — DX: Hypothyroidism, unspecified: E03.9

## 2016-04-01 HISTORY — PX: COLONOSCOPY WITH PROPOFOL: SHX5780

## 2016-04-01 LAB — SURGICAL PATHOLOGY

## 2016-04-01 LAB — POCT PREGNANCY, URINE: Preg Test, Ur: NEGATIVE

## 2016-04-01 SURGERY — COLONOSCOPY WITH PROPOFOL
Anesthesia: General

## 2016-04-01 MED ORDER — PROPOFOL 500 MG/50ML IV EMUL
INTRAVENOUS | Status: DC | PRN
  Administered 2016-04-01: 150 ug/kg/min via INTRAVENOUS

## 2016-04-01 MED ORDER — PROPOFOL 10 MG/ML IV BOLUS
INTRAVENOUS | Status: DC | PRN
  Administered 2016-04-01: 70 mg via INTRAVENOUS

## 2016-04-01 MED ORDER — SODIUM CHLORIDE 0.9 % IV SOLN
INTRAVENOUS | Status: DC
  Administered 2016-04-01: 1000 mL via INTRAVENOUS

## 2016-04-01 MED ORDER — MIDAZOLAM HCL 2 MG/2ML IJ SOLN
INTRAMUSCULAR | Status: DC | PRN
  Administered 2016-04-01: 1 mg via INTRAVENOUS

## 2016-04-01 MED ORDER — SODIUM CHLORIDE 0.9 % IV SOLN
INTRAVENOUS | Status: DC
  Administered 2016-04-01: 16:00:00 via INTRAVENOUS

## 2016-04-01 MED ORDER — LIDOCAINE HCL (CARDIAC) 20 MG/ML IV SOLN
INTRAVENOUS | Status: DC | PRN
  Administered 2016-04-01: 100 mg via INTRAVENOUS

## 2016-04-01 NOTE — Anesthesia Preprocedure Evaluation (Signed)
Anesthesia Evaluation  Patient identified by MRN, date of birth, ID band Patient awake    Reviewed: Allergy & Precautions, H&P , NPO status , Patient's Chart, lab work & pertinent test results, reviewed documented beta blocker date and time   Airway Mallampati: II   Neck ROM: full    Dental  (+) Teeth Intact   Pulmonary neg pulmonary ROS,    Pulmonary exam normal        Cardiovascular negative cardio ROS Normal cardiovascular exam Rhythm:regular Rate:Normal     Neuro/Psych negative neurological ROS  negative psych ROS   GI/Hepatic negative GI ROS, Neg liver ROS,   Endo/Other  negative endocrine ROSHypothyroidism   Renal/GU negative Renal ROS  negative genitourinary   Musculoskeletal   Abdominal   Peds  Hematology negative hematology ROS (+)   Anesthesia Other Findings Past Medical History:   Thyroid disease                                              Thyroid disease                                              Liver disease, chronic                                       Hypothyroidism                                             Past Surgical History:   Hx of laparoscopy                                1998         CESAREAN SECTION                                            BMI    Body Mass Index   41.66 kg/m 2     Reproductive/Obstetrics                             Anesthesia Physical Anesthesia Plan  ASA: II  Anesthesia Plan: General   Post-op Pain Management:    Induction:   Airway Management Planned:   Additional Equipment:   Intra-op Plan:   Post-operative Plan:   Informed Consent: I have reviewed the patients History and Physical, chart, labs and discussed the procedure including the risks, benefits and alternatives for the proposed anesthesia with the patient or authorized representative who has indicated his/her understanding and acceptance.   Dental Advisory  Given  Plan Discussed with: CRNA  Anesthesia Plan Comments:         Anesthesia Quick Evaluation

## 2016-04-01 NOTE — H&P (Signed)
Outpatient short stay form Pre-procedure 04/01/2016 4:21 PM Christena DeemMartin U Partick Musselman MD  Primary Physician: Eliezer BottomJennifer Burnett PA  Reason for visit:  Colonoscopy  History of present illness:  Patient is a 45 year old female presenting today as above. She has a family history of colon polyps and a primary relative, father. He also has been having some recent fecal urgency at times as well as lower abdominal pain. There's been some change of bowel habits is noted. She tolerated her prep well. She takes no blood thinning medications or aspirin products.    Current facility-administered medications:  .  0.9 %  sodium chloride infusion, , Intravenous, Continuous, Christena DeemMartin U Lakeesha Fontanilla, MD, Last Rate: 20 mL/hr at 04/01/16 1531, 1,000 mL at 04/01/16 1531 .  0.9 %  sodium chloride infusion, , Intravenous, Continuous, Christena DeemMartin U Emine Lopata, MD  Prescriptions prior to admission  Medication Sig Dispense Refill Last Dose  . levothyroxine (SYNTHROID, LEVOTHROID) 200 MCG tablet Take 1 tablet (200 mcg total) by mouth daily before breakfast. 90 tablet 1 04/01/2016 at Unknown time  . cephALEXin (KEFLEX) 500 MG capsule Take 1 capsule (500 mg total) by mouth 2 (two) times daily. 20 capsule 0   . dicyclomine (BENTYL) 20 MG tablet Take 1 tablet (20 mg total) by mouth 3 (three) times daily as needed (for abdominal pain). 30 tablet 0 Taking     No Known Allergies   Past Medical History  Diagnosis Date  . Thyroid disease   . Thyroid disease   . Liver disease, chronic   . Hypothyroidism     Review of systems:      Physical Exam    Heart and lungs: Regular rate and rhythm without rub or gallop, lungs are bilaterally clear.    HEENT: Normocephalic atraumatic eyes are anicteric    Other:     Pertinant exam for procedure: Soft nontender nondistended bowel sounds positive normoactive.    Planned proceedures: Colonoscopy and Indicated procedures. I have discussed the risks benefits and complications of procedures to  include not limited to bleeding, infection, perforation and the risk of sedation and the patient wishes to proceed.   Christena DeemMartin U Yaileen Hofferber, MD Gastroenterology 04/01/2016  4:21 PM

## 2016-04-01 NOTE — Transfer of Care (Signed)
Immediate Anesthesia Transfer of Care Note  Patient: Kristin Chavez  Procedure(s) Performed: Procedure(s): COLONOSCOPY WITH PROPOFOL (N/A)  Patient Location: PACU  Anesthesia Type:General  Level of Consciousness: sedated and responds to stimulation  Airway & Oxygen Therapy: Patient Spontanous Breathing and Patient connected to nasal cannula oxygen  Post-op Assessment: Report given to RN and Post -op Vital signs reviewed and stable  Post vital signs: Reviewed and stable  Last Vitals:  Filed Vitals:   04/01/16 1510 04/01/16 1652  BP: 167/79 126/69  Pulse: 69 73  Temp: 37.1 C   Resp: 20 17    Last Pain: There were no vitals filed for this visit.       Complications: No apparent anesthesia complications

## 2016-04-01 NOTE — Op Note (Signed)
[facility] Gastroenterology Patient Name: Kristin RiddleHolly Wilshire Procedure Date: 04/01/2016 4:24 PM MRN: [id number] Account #: [id number] Date of Birth: 07-Mar-1971 Admit Type: Outpatient Age: 45 [facility]OM 4 Gender: Female Note Status: Finalized Procedure:            Colonoscopy Indications:          Family history of colonic polyps in a first-degree                        relative Providers:            Christena DeemMartin U. Tamirah George, MD Referring MD:         No Local Md, MD (Referring MD) Medicines:            Monitored Anesthesia Care Complications:        No immediate complications. Procedure:            Pre-Anesthesia Assessment:                       - ASA Grade Assessment: II - A patient with mild                        systemic disease.                       After obtaining informed consent, the colonoscope was                        passed under direct vision. Throughout the procedure,                        the patient's blood pressure, pulse, and oxygen                        saturations were monitored continuously. The                        Colonoscope was introduced through the anus and                        advanced to the the cecum, identified by appendiceal                        orifice and ileocecal valve. The colonoscopy was                        performed with moderate difficulty. The patient                        tolerated the procedure well. The quality of the bowel                        preparation was good. Findings:      A 4 mm polyp was found in the proximal ascending colon. The polyp was       pedunculated. The polyp was removed with a cold snare. Resection and       retrieval were complete. To prevent bleeding after the polypectomy, one       hemostatic clip was successfully placed. There was no bleeding at the       end of the maneuver.  The digital rectal exam was normal.      The exam was otherwise without abnormality. Impression:            - One 4 mm polyp in the proximal ascending colon,                        removed with a cold snare. Resected and retrieved. Clip                        was placed.                       - The examination was otherwise normal. Recommendation:       - Discharge patient to home.                       - Use Citrucel one tablespoon PO daily.                       - Return to GI clinic in 4 weeks. Procedure Code(s):    --- Professional ---                       [phone], Colonoscopy, flexible; with removal of tumor(s),                        polyp(s), or other lesion(s) by snare technique Diagnosis Code(s):    --- Professional ---                       D12.2, Benign neoplasm of ascending colon                       Z83.71, Family history of colonic polyps CPT copyright 2016 American Medical Association. All rights reserved. The codes documented in this report are preliminary and upon coder review may  be revised to meet current compliance requirements. Christena DeemMartin U Lisbet Busker, MD 04/01/2016 4:52:34 PM This report has been signed electronically. Number of Addenda: 0 Note Initiated On: 04/01/2016 4:24 PM Scope Withdrawal Time: 0 hours 4 minutes 24 seconds  Total Procedure Duration: 0 hours 19 minutes 39 seconds       [facility]

## 2016-04-05 ENCOUNTER — Encounter: Payer: Self-pay | Admitting: Gastroenterology

## 2016-04-05 NOTE — Anesthesia Postprocedure Evaluation (Signed)
Anesthesia Post Note  Patient: Kristin Chavez  Procedure(s) Performed: Procedure(s) (LRB): COLONOSCOPY WITH PROPOFOL (N/A)  Patient location during evaluation: PACU Anesthesia Type: General Level of consciousness: awake and alert Pain management: pain level controlled Vital Signs Assessment: post-procedure vital signs reviewed and stable Respiratory status: spontaneous breathing, nonlabored ventilation, respiratory function stable and patient connected to nasal cannula oxygen Cardiovascular status: blood pressure returned to baseline and stable Postop Assessment: no signs of nausea or vomiting Anesthetic complications: no    Last Vitals:  Filed Vitals:   04/01/16 1712 04/01/16 1722  BP: 118/97 133/91  Pulse: 59 59  Temp:    Resp: 17 19    Last Pain: There were no vitals filed for this visit.               Yevette EdwardsJames G Tanaiya Kolarik

## 2016-06-21 ENCOUNTER — Encounter: Payer: Self-pay | Admitting: Family Medicine

## 2016-06-21 ENCOUNTER — Ambulatory Visit (INDEPENDENT_AMBULATORY_CARE_PROVIDER_SITE_OTHER): Payer: Managed Care, Other (non HMO) | Admitting: Family Medicine

## 2016-06-21 VITALS — BP 116/90 | HR 61 | Temp 98.8°F | Resp 15 | Wt 251.0 lb

## 2016-06-21 DIAGNOSIS — L564 Polymorphous light eruption: Secondary | ICD-10-CM

## 2016-06-21 DIAGNOSIS — M25572 Pain in left ankle and joints of left foot: Secondary | ICD-10-CM | POA: Diagnosis not present

## 2016-06-21 DIAGNOSIS — R1030 Lower abdominal pain, unspecified: Secondary | ICD-10-CM

## 2016-06-21 MED ORDER — PREDNISONE 20 MG PO TABS: ORAL_TABLET | ORAL | 0 refills | Status: DC

## 2016-06-21 MED ORDER — DICYCLOMINE HCL 20 MG PO TABS: 20.0000 mg | ORAL_TABLET | Freq: Three times a day (TID) | ORAL | 0 refills | Status: DC | PRN

## 2016-06-21 NOTE — Progress Notes (Signed)
Subjective:     Patient ID: Kristin Chavez, female   DOB: 1971-01-30, 45 y.o.   MRN: [id number]  HPI  Chief Complaint  Patient presents with  . Skin Problem    Patient comes in office today with concerns of sun poisoning. Patient reports that on Friday she was out in the sun for hours and skin has now turned red on her upper torso  . Ankle Pain    Patient would like to address intermittent ankle pain since Feb. Patient reports walking 2 miles a day and states that she has pain in the left ankile in various spots. Patient has been taking otc Aleve for pain  States this is the photosensitive eruption she has had in the past with dermatology evaluation. States that it is currently the inner aspect of her left ankle that is troubling her with w.b. Also would like refill on Bentyl for abdominal pain she associates with eating red meat. Has had prior G.I. Evaluation.   Review of Systems     Objective:   Physical Exam  Constitutional: She appears well-developed and well-nourished. No distress.  Cardiovascular:  Pulses:      Dorsalis pedis pulses are 2+ on the left side.       Posterior tibial pulses are 2+ on the left side.  Musculoskeletal:  Left ankle with mild medial malleolar tenderness. DF/PF 5/5. Ankle and foot ligaments stable without pain on testing  Skin:  Redness and mild swelling of her face noted       Assessment:    1. Ankle pain, left  2. Polymorphous light eruption - predniSONE (DELTASONE) 20 MG tablet; Taper as follows: 20 mg. Twice daily  Dispense: 14 tablet; Refill: 0  3. Lower abdominal pain - dicyclomine (BENTYL) 20 MG tablet; Take 1 tablet (20 mg total) by mouth 3 (three) times daily as needed (for abdominal pain).  Dispense: 30 tablet; Refill: 0    Plan:    Discussed use of ankle sleeve and cross training for a week. If pain improve start walking program slowly increasing distance. Consider podiatry if not improving.

## 2016-06-21 NOTE — Patient Instructions (Signed)
Discussed use of ankle sleeve. Consider cross training for a week. When you resume your walking gradually work up to 2 miles again.

## 2016-07-04 ENCOUNTER — Other Ambulatory Visit: Payer: Self-pay | Admitting: Family Medicine

## 2016-07-04 ENCOUNTER — Telehealth: Payer: Self-pay | Admitting: Physician Assistant

## 2016-07-04 DIAGNOSIS — M25572 Pain in left ankle and joints of left foot: Secondary | ICD-10-CM

## 2016-07-04 NOTE — Telephone Encounter (Signed)
Pt was in a couple of weeks to see Nadine CountsBob about her left foot pain.  Nadine CountsBob told her if it continued he could refer her to poditrist.  She wants to go ahead and a referral.  She has [employer] ins.  She would like to go to [facility] [address]  Her call back is [phone]  Thank sTeri

## 2016-07-04 NOTE — Telephone Encounter (Signed)
Referral in progress. 

## 2016-07-04 NOTE — Telephone Encounter (Signed)
Referral request. KW  

## 2016-07-08 ENCOUNTER — Encounter: Payer: Self-pay | Admitting: Podiatry

## 2016-07-08 ENCOUNTER — Ambulatory Visit (INDEPENDENT_AMBULATORY_CARE_PROVIDER_SITE_OTHER): Payer: Managed Care, Other (non HMO)

## 2016-07-08 ENCOUNTER — Ambulatory Visit (INDEPENDENT_AMBULATORY_CARE_PROVIDER_SITE_OTHER): Payer: Managed Care, Other (non HMO) | Admitting: Podiatry

## 2016-07-08 VITALS — BP 154/97 | HR 72 | Resp 16

## 2016-07-08 DIAGNOSIS — M79672 Pain in left foot: Secondary | ICD-10-CM

## 2016-07-08 DIAGNOSIS — L609 Nail disorder, unspecified: Secondary | ICD-10-CM | POA: Diagnosis not present

## 2016-07-08 DIAGNOSIS — M778 Other enthesopathies, not elsewhere classified: Secondary | ICD-10-CM

## 2016-07-08 DIAGNOSIS — M79609 Pain in unspecified limb: Secondary | ICD-10-CM

## 2016-07-08 DIAGNOSIS — L608 Other nail disorders: Secondary | ICD-10-CM

## 2016-07-08 DIAGNOSIS — M7752 Other enthesopathy of left foot: Secondary | ICD-10-CM

## 2016-07-08 DIAGNOSIS — B351 Tinea unguium: Secondary | ICD-10-CM

## 2016-07-08 DIAGNOSIS — M779 Enthesopathy, unspecified: Secondary | ICD-10-CM

## 2016-07-08 DIAGNOSIS — L603 Nail dystrophy: Secondary | ICD-10-CM

## 2016-07-08 DIAGNOSIS — Z79899 Other long term (current) drug therapy: Secondary | ICD-10-CM

## 2016-07-08 NOTE — Progress Notes (Signed)
   Subjective:    Patient ID: Kristin Chavez, female    DOB: 09-Mar-1971, 45 y.o.   MRN: [id number]  HPI    Review of Systems  All other systems reviewed and are negative.      Objective:   Physical Exam        Assessment & Plan:

## 2016-07-09 LAB — HEPATIC FUNCTION PANEL
ALT: 21 IU/L (ref 0–32)
AST: 16 IU/L (ref 0–40)
Albumin: 4.3 g/dL (ref 3.5–5.5)
Alkaline Phosphatase: 44 IU/L (ref 39–117)
Bilirubin Total: 0.5 mg/dL (ref 0.0–1.2)
Bilirubin, Direct: 0.11 mg/dL (ref 0.00–0.40)
Total Protein: 7.1 g/dL (ref 6.0–8.5)

## 2016-07-09 NOTE — Progress Notes (Signed)
Patient ID: Kristin Chavez, female   DOB: Jan 22, 1971, 45 y.o.   MRN: [id number] Subjective: Patient presents today for possible treatment and evaluation of fungal nails to the right great toe. Patient states that she has noticed the right great toenail becoming dystrophic and yellow with partial detachment from the last several months. Patient presents today for further treatment and evaluation.  Patient also has a complaint of left foot pain diffusely. Patient states that she only notices the pain when she is walking. Patient is an avid walker and walks approximately 2 miles a day.  Objective: Physical Exam General: The patient is alert and oriented x3 in no acute distress.  Dermatology: Hyperkeratotic, discolored, thickened, onychodystrophy of noted to the right great toenail.  Skin is warm, dry and supple bilateral lower extremities. Negative for open lesions or macerations.  Vascular: Palpable pedal pulses bilaterally. No edema or erythema noted. Capillary refill within normal limits.  Neurological: Epicritic and protective threshold grossly intact bilaterally.   Musculoskeletal Exam: Negative for pain on palpation across all pedal and ankle joints of the left lower extremity.  Range of motion within normal limits to all pedal and ankle joints bilateral. Muscle strength 5/5 in all groups bilateral.   Assessment: #1 onychodystrophy right great toenail #2 possible onychomycosis #3 hyperkeratotic toenail right great toe #4 diffuse pain left foot. #5 possible midfoot capsulitis left #6 possible midsubstance plantar fasciitis left  Plan of Care:  #1 Patient was evaluated. #2 Orders for liver function tests were ordered today.  #3 Today nail biopsy was taken and sent to pathology for fungal culture. #5 patient is to return to clinic in 4 weeks to discuss fungal culture nail biopsy findings and LFT results and discuss different treatment options. #6 recommend plantar fascial brace for  left foot pain, to be used when she walks.  Dr. Felecia ShellingBrent M. Evans, DPM [facility]

## 2016-07-11 ENCOUNTER — Other Ambulatory Visit: Payer: Self-pay | Admitting: Family Medicine

## 2016-07-11 ENCOUNTER — Telehealth: Payer: Self-pay | Admitting: Physician Assistant

## 2016-07-11 ENCOUNTER — Telehealth: Payer: Self-pay | Admitting: *Deleted

## 2016-07-11 DIAGNOSIS — R1030 Lower abdominal pain, unspecified: Secondary | ICD-10-CM

## 2016-07-11 MED ORDER — DICYCLOMINE HCL 20 MG PO TABS: 20.0000 mg | ORAL_TABLET | Freq: Three times a day (TID) | ORAL | 0 refills | Status: DC | PRN

## 2016-07-11 MED ORDER — MELOXICAM 15 MG PO TABS: 15.0000 mg | ORAL_TABLET | Freq: Every day | ORAL | 0 refills | Status: DC

## 2016-07-11 NOTE — Telephone Encounter (Signed)
Pt states the pharmacy has not rec'd the Rx refill for dicyclomine (BENTYL) 20 MG tablet.  [facility] [address]  CB#[phone]/MW

## 2016-07-11 NOTE — Telephone Encounter (Signed)
Could not send electronically. See phone in refill request

## 2016-07-11 NOTE — Telephone Encounter (Signed)
Please review. KW 

## 2016-07-11 NOTE — Telephone Encounter (Signed)
Prescription has been called in.KW 

## 2016-07-11 NOTE — Telephone Encounter (Addendum)
Pt states she was to get a pain medication at last visit, but it has not been [facility] to [facility]. Left message informing pt I would inform Dr. Logan BoresEvans, and he would let me know of orders. Left message informing pt Dr. Logan BoresEvans [facility] Mel[facility]

## 2016-07-11 NOTE — Telephone Encounter (Signed)
Yeah we never discussed pain medication on her last visit. Let's just send her in an Rx for Meloxicam15mg  QD #60.  Thanks!

## 2016-07-12 ENCOUNTER — Other Ambulatory Visit: Payer: Self-pay | Admitting: Physician Assistant

## 2016-07-12 DIAGNOSIS — E039 Hypothyroidism, unspecified: Secondary | ICD-10-CM

## 2016-08-05 ENCOUNTER — Ambulatory Visit (INDEPENDENT_AMBULATORY_CARE_PROVIDER_SITE_OTHER): Payer: Managed Care, Other (non HMO) | Admitting: Podiatry

## 2016-08-05 DIAGNOSIS — B351 Tinea unguium: Secondary | ICD-10-CM | POA: Diagnosis not present

## 2016-08-05 DIAGNOSIS — L608 Other nail disorders: Secondary | ICD-10-CM | POA: Diagnosis not present

## 2016-08-05 DIAGNOSIS — M79609 Pain in unspecified limb: Secondary | ICD-10-CM | POA: Diagnosis not present

## 2016-08-05 DIAGNOSIS — L603 Nail dystrophy: Secondary | ICD-10-CM | POA: Diagnosis not present

## 2016-08-05 MED ORDER — NONFORMULARY OR COMPOUNDED ITEM
1.0000 [drp] | Freq: Every day | 2 refills | Status: DC
Start: 2016-08-05 — End: 2018-02-12

## 2016-08-14 NOTE — Progress Notes (Signed)
Patient ID: Kristin Chavez, female   DOB: 1970/11/06, 45 y.o.   MRN: [id number] Subjective: Patient presents today for possible treatment and evaluation of fungal nails to the right great toe. Patient states that she has noticed the right great toenail becoming dystrophic and yellow with partial detachment from the last several months. Patient presents today for further treatment and evaluation.  Patient also has a complaint of left foot pain diffusely. Patient states that she only notices the pain when she is walking. Patient is an avid walker and walks approximately 2 miles a day.  Objective: Physical Exam General: The patient is alert and oriented x3 in no acute distress.  Dermatology: Hyperkeratotic, discolored, thickened, onychodystrophy of noted to the right great toenail.  Skin is warm, dry and supple bilateral lower extremities. Negative for open lesions or macerations.  Vascular: Palpable pedal pulses bilaterally. No edema or erythema noted. Capillary refill within normal limits.  Neurological: Epicritic and protective threshold grossly intact bilaterally.   Musculoskeletal Exam: Negative for pain on palpation across all pedal and ankle joints of the left lower extremity.  Range of motion within normal limits to all pedal and ankle joints bilateral. Muscle strength 5/5 in all groups bilateral.   Assessment: #1 onychodystrophy right great toenail #2 possible onychomycosis #3 hyperkeratotic toenail right great toe #4 diffuse pain left foot. #5 possible midfoot capsulitis left #6 possible midsubstance plantar fasciitis left  Plan of Care:  #1 Patient was evaluated. Fungal nail culture was reviewed positive for fungus. #2 today we discussed in detail the treatment modalities for onychomycosis, including oral, topical, and laser therapy. #3 the patient will on for oral, topical, and laser therapy treatment modalities #4 prescription for Lamisil 250 mg 90 #5 prescription for  [facility]ifungal nail lacquer [facility]6 schedule appointment in 4 weeks with Clifton JamesJessica Quintana [place] for laser therapy #7 patient will require approximately 4 treatment of laser therapy approximately one month  #8  between treatmentreturn to clinic in 4 months Dr. Felecia ShellingBrent M. Levaeh Vice, DPM [facility]

## 2016-09-06 ENCOUNTER — Ambulatory Visit: Payer: Managed Care, Other (non HMO)

## 2016-12-09 ENCOUNTER — Ambulatory Visit: Payer: Managed Care, Other (non HMO) | Admitting: Podiatry

## 2018-02-09 ENCOUNTER — Telehealth: Payer: Self-pay | Admitting: Physician Assistant

## 2018-02-09 NOTE — Telephone Encounter (Signed)
error 

## 2018-02-12 ENCOUNTER — Other Ambulatory Visit: Payer: Self-pay

## 2018-02-12 ENCOUNTER — Ambulatory Visit: Payer: 59 | Admitting: Physician Assistant

## 2018-02-12 ENCOUNTER — Encounter: Payer: Self-pay | Admitting: Physician Assistant

## 2018-02-12 VITALS — BP 144/98 | Ht 66.0 in | Wt 289.0 lb

## 2018-02-12 DIAGNOSIS — E119 Type 2 diabetes mellitus without complications: Secondary | ICD-10-CM | POA: Diagnosis not present

## 2018-02-12 DIAGNOSIS — R1030 Lower abdominal pain, unspecified: Secondary | ICD-10-CM

## 2018-02-12 DIAGNOSIS — Z6841 Body Mass Index (BMI) 40.0 and over, adult: Secondary | ICD-10-CM

## 2018-02-12 DIAGNOSIS — A630 Anogenital (venereal) warts: Secondary | ICD-10-CM | POA: Diagnosis not present

## 2018-02-12 DIAGNOSIS — Z7689 Persons encountering health services in other specified circumstances: Secondary | ICD-10-CM | POA: Diagnosis not present

## 2018-02-12 DIAGNOSIS — K760 Fatty (change of) liver, not elsewhere classified: Secondary | ICD-10-CM | POA: Diagnosis not present

## 2018-02-12 DIAGNOSIS — E039 Hypothyroidism, unspecified: Secondary | ICD-10-CM | POA: Diagnosis not present

## 2018-02-12 DIAGNOSIS — D508 Other iron deficiency anemias: Secondary | ICD-10-CM

## 2018-02-12 MED ORDER — DICYCLOMINE HCL 20 MG PO TABS
20.0000 mg | ORAL_TABLET | Freq: Three times a day (TID) | ORAL | 1 refills | Status: DC | PRN
Start: 2018-02-12 — End: 2019-08-27

## 2018-02-12 NOTE — Progress Notes (Signed)
Patient: Kristin Chavez Female    DOB: March 24, 1971   47 y.o.   MRN: [id number] Visit Date: 02/12/2018  Today's Provider: Margaretann Loveless, PA-C   Chief Complaint  Patient presents with  . Thyroid Problem   Subjective:    Thyroid Problem  Presents for follow-up visit. Symptoms include anxiety, fatigue, hair loss and weight gain. Patient reports no menstrual problem. The symptoms have been worsening.   She has previously been treated for hypothyroidism and was maintained previously on levothyroxine . She discontinued treatment on her own almost 1 year ago.  She is also requesting a refill on Bentyl. She uses this only prn when she gets abdominal cramping associated with stress. Things have been going well in her life so she has not needed this often.   Has complaint of "vaginal bumps" which she thinks is a flare of her genital warts. She has known genital warts and has had to have cryotherapy of her cervix in the past. This is the first outbreak externally.   Also would like labs checked.    No Known Allergies   Current Outpatient Medications:  .  dicyclomine (BENTYL) 20 MG tablet, Take 1 tablet (20 mg total) by mouth 3 (three) times daily as needed (for abdominal pain). (Patient not taking: Reported on 02/12/2018), Disp: 30 tablet, Rfl: 0  Review of Systems  Constitutional: Positive for fatigue, unexpected weight change and weight gain.  HENT: Negative.   Respiratory: Negative.   Cardiovascular: Negative.   Gastrointestinal: Negative.   Genitourinary: Positive for genital sores. Negative for hematuria, menstrual problem, pelvic pain, vaginal bleeding, vaginal discharge and vaginal pain.  Skin:       Warts  Neurological: Negative.   Psychiatric/Behavioral: The patient is nervous/anxious.     Social History   Tobacco Use  . Smoking status: Never Smoker  . Smokeless tobacco: Never Used  Substance Use Topics  . Alcohol use: No   Objective:   BP (!) 144/98    Ht  (1.676 m)   Wt 289 lb (131.1 kg)   LMP 02/07/2018   BMI 46.65 kg/m  Vitals:   02/12/18 0909  BP: (!) 144/98  Weight: 289 lb (131.1 kg)  Height:  (1.676 m)     Physical Exam  Constitutional: She appears well-developed and well-nourished. No distress.  Neck: Normal range of motion. Neck supple. No JVD present. No tracheal deviation present. No thyromegaly present.  Cardiovascular: Normal rate, regular rhythm and normal heart sounds. Exam reveals no gallop and no friction rub.  No murmur heard. Pulmonary/Chest: Effort normal and breath sounds normal. No respiratory distress. She has no wheezes. She has no rales.  Musculoskeletal: She exhibits no edema.  Lymphadenopathy:    She has no cervical adenopathy.  Skin: She is not diaphoretic.  Vitals reviewed.       Assessment & Plan:     1. Adult hypothyroidism Will recheck labs as below to see how off her thyroid is and will then start treatment. I will see her back in 6 weeks for CPE w pap and to recheck thyroid labs.  - CBC w/Diff/Platelet - TSH  2. Lower abdominal pain Stable. Diagnosis pulled for medication refill. Continue current medical treatment plan. Will check labs as below and f/u pending results. - dicyclomine (BENTYL) 20 MG tablet; Take 1 tablet (20 mg total) by mouth 3 (three) times daily as needed (for abdominal pain).  Dispense: 30 tablet; Refill: 1 - CBC w/Diff/Platelet -  Comprehensive Metabolic Panel (CMET) - HgB A1c - Lipid Profile  3. Fatty metamorphosis of liver H/O this. Trying to control with dietary changes. No labs checked in 2 years. Will check labs as below and f/u pending results. - CBC w/Diff/Platelet - Comprehensive Metabolic Panel (CMET) - HgB A1c - Lipid Profile  4. BMI 45.0-49.9, adult [facility]) Counseled patient on healthy lifestyle modifications including dieting and exercise.  Will check labs as below and f/u pending results. - CBC w/Diff/Platelet - Comprehensive Metabolic  Panel (CMET) - HgB A1c - TSH - Lipid Profile  5. Genital warts H/O this. Possible flare. Not wanting pelvic exam today. Will have her return in 6 weeks for CPE w pap and will determine if warts at that time.        Margaretann Loveless, PA-C  [facility]

## 2018-02-12 NOTE — Patient Instructions (Signed)
Hypothyroidism Hypothyroidism is a disorder of the thyroid. The thyroid is a large gland that is located in the lower front of the neck. The thyroid releases hormones that control how the body works. With hypothyroidism, the thyroid does not make enough of these hormones. What are the causes? Causes of hypothyroidism may include:  Viral infections.  Pregnancy.  Your own defense system (immune system) attacking your thyroid.  Certain medicines.  Birth defects.  Past radiation treatments to your head or neck.  Past treatment with radioactive iodine.  Past surgical removal of part or all of your thyroid.  Problems with the gland that is located in the center of your brain (pituitary).  What are the signs or symptoms? Signs and symptoms of hypothyroidism may include:  Feeling as though you have no energy (lethargy).  Inability to tolerate cold.  Weight gain that is not explained by a change in diet or exercise habits.  Dry skin.  Coarse hair.  Menstrual irregularity.  Slowing of thought processes.  Constipation.  Sadness or depression.  How is this diagnosed? Your health care provider may diagnose hypothyroidism with blood tests and ultrasound tests. How is this treated? Hypothyroidism is treated with medicine that replaces the hormones that your body does not make. After you begin treatment, it may take several weeks for symptoms to go away. Follow these instructions at home:  Take medicines only as directed by your health care provider.  If you start taking any new medicines, tell your health care provider.  Keep all follow-up visits as directed by your health care provider. This is important. As your condition improves, your dosage needs may change. You will need to have blood tests regularly so that your health care provider can watch your condition. Contact a health care provider if:  Your symptoms do not get better with treatment.  You are taking thyroid  replacement medicine and: ? You sweat excessively. ? You have tremors. ? You feel anxious. ? You lose weight rapidly. ? You cannot tolerate heat. ? You have emotional swings. ? You have diarrhea. ? You feel weak. Get help right away if:  You develop chest pain.  You develop an irregular heartbeat.  You develop a rapid heartbeat. This information is not intended to replace advice given to you by your health care provider. Make sure you discuss any questions you have with your health care provider. Document Released: 09/26/2005 Document Revised: 03/03/2016 Document Reviewed: 02/11/2014 Elsevier Interactive Patient Education  2018 [employer]vier Inc[employer]  

## 2018-02-13 LAB — CBC WITH DIFFERENTIAL/PLATELET
Basophils Absolute: 0.1 10*3/uL (ref 0.0–0.2)
Basos: 1 %
EOS (ABSOLUTE): 0.2 10*3/uL (ref 0.0–0.4)
EOS: 3 %
HEMATOCRIT: 30.2 % — AB (ref 34.0–46.6)
HEMOGLOBIN: 9.3 g/dL — AB (ref 11.1–15.9)
IMMATURE GRANS (ABS): 0 10*3/uL (ref 0.0–0.1)
Immature Granulocytes: 0 %
Lymphocytes Absolute: 2.1 10*3/uL (ref 0.7–3.1)
Lymphs: 25 %
MCH: 22.3 pg — AB (ref 26.6–33.0)
MCHC: 30.8 g/dL — ABNORMAL LOW (ref 31.5–35.7)
MCV: 72 fL — AB (ref 79–97)
MONOCYTES: 7 %
Monocytes Absolute: 0.6 10*3/uL (ref 0.1–0.9)
NEUTROS ABS: 5.4 10*3/uL (ref 1.4–7.0)
Neutrophils: 64 %
PLATELETS: 458 10*3/uL — AB (ref 150–379)
RBC: 4.17 x10E6/uL (ref 3.77–5.28)
RDW: 16.6 % — ABNORMAL HIGH (ref 12.3–15.4)
WBC: 8.4 10*3/uL (ref 3.4–10.8)

## 2018-02-13 LAB — COMPREHENSIVE METABOLIC PANEL
A/G RATIO: 1.4 (ref 1.2–2.2)
ALBUMIN: 4.2 g/dL (ref 3.5–5.5)
ALT: 14 IU/L (ref 0–32)
AST: 16 IU/L (ref 0–40)
Alkaline Phosphatase: 61 IU/L (ref 39–117)
BILIRUBIN TOTAL: 0.3 mg/dL (ref 0.0–1.2)
BUN/Creatinine Ratio: 14 (ref 9–23)
BUN: 10 mg/dL (ref 6–24)
CHLORIDE: 100 mmol/L (ref 96–106)
CO2: 22 mmol/L (ref 20–29)
Calcium: 9.4 mg/dL (ref 8.7–10.2)
Creatinine, Ser: 0.71 mg/dL (ref 0.57–1.00)
GFR calc non Af Amer: 102 mL/min/{1.73_m2} (ref >59)
GFR, EST AFRICAN AMERICAN: 117 mL/min/{1.73_m2} (ref >59)
GLOBULIN, TOTAL: 3 g/dL (ref 1.5–4.5)
Glucose: 100 mg/dL — ABNORMAL HIGH (ref 65–99)
POTASSIUM: 4.8 mmol/L (ref 3.5–5.2)
SODIUM: 140 mmol/L (ref 134–144)
TOTAL PROTEIN: 7.2 g/dL (ref 6.0–8.5)

## 2018-02-13 LAB — LIPID PANEL
Chol/HDL Ratio: 3.2 ratio (ref 0.0–4.4)
Cholesterol, Total: 189 mg/dL (ref 100–199)
HDL: 59 mg/dL (ref >39)
LDL CALC: 105 mg/dL — AB (ref 0–99)
Triglycerides: 123 mg/dL (ref 0–149)
VLDL CHOLESTEROL CAL: 25 mg/dL (ref 5–40)

## 2018-02-13 LAB — TSH: TSH: 21.79 u[IU]/mL — ABNORMAL HIGH (ref 0.450–4.500)

## 2018-02-13 LAB — HEMOGLOBIN A1C
Est. average glucose Bld gHb Est-mCnc: 146 mg/dL
Hgb A1c MFr Bld: 6.7 % — ABNORMAL HIGH (ref 4.8–5.6)

## 2018-02-14 LAB — FERRITIN: Ferritin: 7 ng/mL — ABNORMAL LOW (ref 15–150)

## 2018-02-14 LAB — IRON AND TIBC
Iron Saturation: 4 % — CL (ref 15–55)
Iron: 18 ug/dL — ABNORMAL LOW (ref 27–159)
TIBC: 460 ug/dL — AB (ref 250–450)
UIBC: 442 ug/dL — AB (ref 131–425)

## 2018-02-14 LAB — SPECIMEN STATUS REPORT

## 2018-02-15 ENCOUNTER — Telehealth: Payer: Self-pay

## 2018-02-15 DIAGNOSIS — E119 Type 2 diabetes mellitus without complications: Secondary | ICD-10-CM | POA: Insufficient documentation

## 2018-02-15 DIAGNOSIS — D508 Other iron deficiency anemias: Secondary | ICD-10-CM | POA: Insufficient documentation

## 2018-02-15 DIAGNOSIS — D509 Iron deficiency anemia, unspecified: Secondary | ICD-10-CM | POA: Insufficient documentation

## 2018-02-15 MED ORDER — LEVOTHYROXINE SODIUM 50 MCG PO TABS: 50.0000 ug | ORAL_TABLET | Freq: Every day | ORAL | 0 refills | Status: DC

## 2018-02-15 NOTE — Telephone Encounter (Signed)
-----   Message from Margaretann Loveless, PA-C sent at 02/15/2018  4:48 PM EDT ----- Iron levels are significantly low with anemia noted. Kidney and liver function are normal. A1c is elevated at 6.7. This is indicative of Diabetes. This could also be why you are feeling poorly. TSH is off at 21.790. Will send in levothyroxine to start. Cholesterol is normal. Recommend really focusing on dietary changes for the elevated A1c. Start ferrous sulfate  twice daily (this will make stools darker in color). We can recheck TSH, A1c, CBC and iron stores in 6-8 weeks.

## 2018-02-15 NOTE — Telephone Encounter (Signed)
Patient advised as below. Patient verbalizes understanding and is in agreement with treatment plan.  

## 2018-02-15 NOTE — Addendum Note (Signed)
Addended by: Margaretann Loveless on: 02/15/2018 04:49 PM   Modules accepted: Orders

## 2018-03-20 ENCOUNTER — Telehealth: Payer: Self-pay | Admitting: Physician Assistant

## 2018-03-20 DIAGNOSIS — D508 Other iron deficiency anemias: Secondary | ICD-10-CM

## 2018-03-20 DIAGNOSIS — E119 Type 2 diabetes mellitus without complications: Secondary | ICD-10-CM

## 2018-03-20 DIAGNOSIS — E039 Hypothyroidism, unspecified: Secondary | ICD-10-CM

## 2018-03-20 NOTE — Telephone Encounter (Signed)
Lab slip printed and placed up front ready for pick up.  Thanks,  -Berdene Askari

## 2018-03-20 NOTE — Telephone Encounter (Signed)
Pt stated that she is due to have her labs rechecked and is requesting a lab slip for the following:  Iron TSH A1C  Please advise. Thanks TNP

## 2018-03-26 ENCOUNTER — Encounter: Payer: Self-pay | Admitting: Physician Assistant

## 2018-05-13 ENCOUNTER — Other Ambulatory Visit: Payer: Self-pay | Admitting: Physician Assistant

## 2018-05-13 DIAGNOSIS — E039 Hypothyroidism, unspecified: Secondary | ICD-10-CM

## 2018-12-04 ENCOUNTER — Encounter: Payer: Self-pay | Admitting: Emergency Medicine

## 2018-12-04 ENCOUNTER — Emergency Department
Admission: EM | Admit: 2018-12-04 | Discharge: 2018-12-04 | Disposition: A | Discharge status: Home / Self Care | Payer: 59 | Attending: Emergency Medicine | Admitting: Emergency Medicine

## 2018-12-04 ENCOUNTER — Other Ambulatory Visit: Payer: Self-pay

## 2018-12-04 ENCOUNTER — Emergency Department: Payer: 59

## 2018-12-04 DIAGNOSIS — R42 Dizziness and giddiness: Secondary | ICD-10-CM | POA: Diagnosis not present

## 2018-12-04 DIAGNOSIS — E119 Type 2 diabetes mellitus without complications: Secondary | ICD-10-CM | POA: Insufficient documentation

## 2018-12-04 DIAGNOSIS — R079 Chest pain, unspecified: Secondary | ICD-10-CM

## 2018-12-04 DIAGNOSIS — E039 Hypothyroidism, unspecified: Secondary | ICD-10-CM | POA: Diagnosis not present

## 2018-12-04 DIAGNOSIS — R0789 Other chest pain: Secondary | ICD-10-CM | POA: Diagnosis not present

## 2018-12-04 DIAGNOSIS — J40 Bronchitis, not specified as acute or chronic: Secondary | ICD-10-CM | POA: Diagnosis not present

## 2018-12-04 LAB — BASIC METABOLIC PANEL
ANION GAP: 10 (ref 5–15)
BUN: 13 mg/dL (ref 6–20)
CALCIUM: 8.9 mg/dL (ref 8.9–10.3)
CO2: 23 mmol/L (ref 22–32)
Chloride: 103 mmol/L (ref 98–111)
Creatinine, Ser: 0.91 mg/dL (ref 0.44–1.00)
GLUCOSE: 153 mg/dL — AB (ref 70–99)
POTASSIUM: 3.6 mmol/L (ref 3.5–5.1)
Sodium: 136 mmol/L (ref 135–145)

## 2018-12-04 LAB — FIBRIN DERIVATIVES D-DIMER (ARMC ONLY): Fibrin derivatives D-dimer (ARMC): 363.74 ng/mL (FEU) (ref 0.00–499.00)

## 2018-12-04 LAB — CBC
HCT: 35.1 % — ABNORMAL LOW (ref 36.0–46.0)
Hemoglobin: 10.7 g/dL — ABNORMAL LOW (ref 12.0–15.0)
MCH: 23.2 pg — ABNORMAL LOW (ref 26.0–34.0)
MCHC: 30.5 g/dL (ref 30.0–36.0)
MCV: 76.1 fL — ABNORMAL LOW (ref 80.0–100.0)
Platelets: 437 10*3/uL — ABNORMAL HIGH (ref 150–400)
RBC: 4.61 MIL/uL (ref 3.87–5.11)
RDW: 17.8 % — ABNORMAL HIGH (ref 11.5–15.5)
WBC: 9.7 10*3/uL (ref 4.0–10.5)
nRBC: 0 % (ref 0.0–0.2)

## 2018-12-04 LAB — GLUCOSE, CAPILLARY: GLUCOSE-CAPILLARY: 134 mg/dL — AB (ref 70–99)

## 2018-12-04 LAB — TROPONIN I: Troponin I: <0.03 ng/mL (ref <0.03)

## 2018-12-04 MED ORDER — SODIUM CHLORIDE 0.9% FLUSH: 3.0000 mL | Freq: Once | INTRAVENOUS | Status: DC

## 2018-12-04 MED ORDER — FERROUS SULFATE DRIED ER 160 (50 FE) MG PO TBCR: 160.0000 mg | EXTENDED_RELEASE_TABLET | ORAL | 6 refills | Status: DC

## 2018-12-04 NOTE — ED Notes (Signed)
ED Provider at bedside. 

## 2018-12-04 NOTE — ED Notes (Signed)
First nurse note: completed a 24 hour fast this am, before eating became dizzy, nauseated and diaphoretic, NAD at this time.

## 2018-12-04 NOTE — ED Triage Notes (Signed)
Fasting 24hour , this AM diaphoretic, dizziness, chest pain, and fatigue

## 2018-12-04 NOTE — ED Provider Notes (Signed)
[facility] Emergency Department Provider Note       Time seen: ----------------------------------------- 9:23 AM on 12/04/2018 -----------------------------------------   I have reviewed the triage vital signs and the nursing notes.  HISTORY   Chief Complaint Chest Pain    HPI Kristin Chavez is a 48 y.o. female with a history of hypothyroidism, liver disease, thyroid disease, who presents to the ED for diaphoresis with dizziness, chest pain and fatigue.  Patient reports she was coming off of a 24-hour fast.  She is also diabetic and did not check her blood sugar, does not know if it dropped or not.  She denies any recent illness or other complaints.  This is not happened to her before.  Past Medical History:  Diagnosis Date  . Hypothyroidism   . Liver disease, chronic   . Thyroid disease   . Thyroid disease     Patient Active Problem List   Diagnosis Date Noted  . Iron deficiency anemia secondary to inadequate dietary iron intake 02/15/2018  . Type 2 diabetes mellitus without complication, without long-term current use of insulin (HCC) 02/15/2018  . Genital warts 02/12/2018  . BMI 45.0-49.9, adult (HCC) 02/12/2018  . 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 Snores 01/15/2016  . Avitaminosis D 01/15/2016  . Contusion of foot 01/11/2010  . Hay fever 07/28/2009  . Fatty metamorphosis of liver 02/27/2009  . Fatty infiltration of liver 02/27/2009  . Adiposity 07/11/2008  . Adult hypothyroidism 05/10/2007    Past Surgical History:  Procedure Laterality Date  . CESAREAN SECTION    . COLONOSCOPY WITH PROPOFOL N/A 04/01/2016   Procedure: COLONOSCOPY WITH PROPOFOL;  Surgeon: Christena Deem, MD;  Location[facility] ENDOSCOPY;  Service: Endoscopy;  Laterality: N/A;  . Hx of laparoscopy  1998    Allergies Patient has no known allergies.  Social History Social History   Tobacco Use  . Smoking status: Never Smoker  . Smokeless tobacco: Never Used   Substance Use Topics  . Alcohol use: No  . Drug use: No   Review of Systems Constitutional: Negative for fever. Cardiovascular: Positive for chest pain Respiratory: Negative for shortness of breath. Gastrointestinal: Negative for abdominal pain, vomiting and diarrhea. Musculoskeletal: Negative for back pain. Skin: Negative for rash. Neurological: Positive for dizziness, weakness  All systems negative/normal/unremarkable except as stated in the HPI  ____________________________________________   PHYSICAL EXAM:  VITAL SIGNS: ED Triage Vitals  Enc Vitals Group     BP 12/04/18 0844 (!) 189/87     Pulse Rate 12/04/18 0844 90     Resp 12/04/18 0844 18     Temp 12/04/18 0844 98.4 F (36.9 C)     Temp Source 12/04/18 0844 Oral     SpO2 12/04/18 0844 96 %     Weight 12/04/18 0841 290 lb (131.5 kg)     Height 12/04/18 0841 5\' 7"  (1.702 m)     Head Circumference --      Peak Flow --      Pain Score 12/04/18 0841 0     Pain Loc --      Pain Edu? --      Excl. in GC? --    Constitutional: Alert and oriented. Well appearing and in no distress. Eyes: Conjunctivae are normal. Normal extraocular movements. ENT      Head: Normocephalic and atraumatic.      Nose: No congestion/rhinnorhea.      Mouth/Throat: Mucous membranes are moist.      Neck: No stridor.  Cardiovascular: Normal rate, regular rhythm. No murmurs, rubs, or gallops. Respiratory: Normal respiratory effort without tachypnea nor retractions. Breath sounds are clear and equal bilaterally. No wheezes/rales/rhonchi. Gastrointestinal: Soft and nontender. Normal bowel sounds Musculoskeletal: Nontender with normal range of motion in extremities. No lower extremity tenderness nor edema. Neurologic:  Normal speech and language. No gross focal neurologic deficits are appreciated.  Skin:  Skin is warm, dry and intact. No rash noted. Psychiatric: Mood and affect are normal. Speech and behavior are normal.   ____________________________________________  EKG: Interpreted by me.  Sinus rhythm the rate 84 bpm, normal PR interval, normal QRS, normal QT  ____________________________________________  ED COURSE:  As part of my medical decision making, I reviewed the following data within the electronic MEDICAL RECORD NUMBER History obtained from family if available, nursing notes, old chart and ekg, as well as notes from prior ED visits. Patient presented for dizziness with diaphoresis, chest pain and fatigue, we will assess with labs and imaging as indicated at this time.   Procedures ____________________________________________   LABS (pertinent positives/negatives)  Labs Reviewed  BASIC METABOLIC PANEL - Abnormal; Notable for the following components:      Result Value   Glucose, Bld 153 (*)    All other components within normal limits  CBC - Abnormal; Notable for the following components:   Hemoglobin 10.7 (*)    HCT 35.1 (*)    MCV 76.1 (*)    MCH 23.2 (*)    RDW 17.8 (*)    Platelets 437 (*)    All other components within normal limits  GLUCOSE, CAPILLARY - Abnormal; Notable for the following components:   Glucose-Capillary 134 (*)    All other components within normal limits  TROPONIN I  FIBRIN DERIVATIVES D-DIMER ([facility] ONLY)  POC URINE PREG, ED    RADIOLOGY Images were viewed by me  Chest x-ray IMPRESSION: Minimal bronchitic changes. ____________________________________________   DIFFERENTIAL DIAGNOSIS   Hypoglycemia, anxiety, arrhythmia, MI, unstable angina, PE  FINAL ASSESSMENT AND PLAN  Dizziness  Plan: The patient had presented for multiple complaints. Patient's labs did not reveal any acute process. Patient's imaging was also negative.  Symptoms are likely multifactorial, she is cleared for outpatient cardiology follow-up.   Ulice Dash, MD    Note: This note was generated in part or whole with voice recognition software. Voice recognition is usually  quite accurate but there are transcription errors that can and very often do occur. I apologize for any typographical errors that were not detected and corrected.     Emily Filbert, MD 12/04/18 [phone]

## 2018-12-08 ENCOUNTER — Other Ambulatory Visit: Payer: Self-pay | Admitting: Physician Assistant

## 2018-12-08 DIAGNOSIS — E039 Hypothyroidism, unspecified: Secondary | ICD-10-CM

## 2019-05-20 NOTE — Progress Notes (Signed)
Patient: Kristin Chavez Female    DOB: Jan 22, 1971   48 y.o.   MRN: [id number] Visit Date: 05/21/2019  Today's Provider: Margaretann LovelessJennifer M Alyxandra Tenbrink, PA-C   Chief Complaint  Patient presents with  . Follow-up  . Hypothyroidism   Subjective:    Virtual Visit via Video Note  I connected with Kristin RingsHolly B Waide on 05/21/19 at  3:40 PM EDT by a video enabled telemedicine application and verified that I am speaking with the correct person using two identifiers.  Location: Patient: Home Provider: [facility]   I discussed the limitations of evaluation and management by telemedicine and the availability of in person appointments. The patient expressed understanding and agreed to proceed.   HPI   Birdie RiddleHolly Byrom is a 48 yr old female that presents today via video visit for follow up of her thyroid. She has not had labs to check her thyroid since May 2019. She is also needing her A1c, renal function and lipids checked as well. She reports feeling more fatigued recently and thinks her thyroid may need to be adjusted again. Otherwise she is feeling well and has no complaints.   No Known Allergies   Current Outpatient Medications:  .  ferrous sulfate (EQL SLOW RELEASE IRON) 160 (50 Fe) MG TBCR SR tablet, Take 1 tablet (160 mg total) by mouth every other day., Disp: 30 each, Rfl: 6 .  levothyroxine (SYNTHROID, LEVOTHROID) 100 MCG tablet, Take 100 mcg by mouth daily before breakfast., Disp: , Rfl:  .  dicyclomine (BENTYL) 20 MG tablet, Take 1 tablet (20 mg total) by mouth 3 (three) times daily as needed (for abdominal pain). (Patient not taking: Reported on 12/04/2018), Disp: 30 tablet, Rfl: 1 .  levothyroxine (SYNTHROID, LEVOTHROID) 50 MCG tablet, TAKE 1 TABLET BY MOUTH EVERY DAY (Patient not taking: Reported on 12/04/2018), Disp: 90 tablet, Rfl: 1  Review of Systems  Constitutional: Positive for fatigue. Negative for appetite change, chills and fever.  Respiratory: Negative for chest tightness and shortness  of breath.   Cardiovascular: Negative for chest pain and palpitations.  Gastrointestinal: Negative for abdominal pain, nausea and vomiting.  Endocrine: Negative for polydipsia, polyphagia and polyuria.  Neurological: Negative for dizziness, weakness and numbness.    Social History   Tobacco Use  . Smoking status: Never Smoker  . Smokeless tobacco: Never Used  Substance Use Topics  . Alcohol use: No      Objective:   There were no vitals taken for this visit. There were no vitals filed for this visit.   Physical Exam Vitals signs reviewed.  Constitutional:      General: She is not in acute distress.    Appearance: Normal appearance. She is well-developed. She is obese. She is not ill-appearing.  HENT:     Head: Normocephalic and atraumatic.  Neck:     Musculoskeletal: Normal range of motion and neck supple.  Pulmonary:     Effort: Pulmonary effort is normal. No respiratory distress.  Neurological:     Mental Status: She is alert.  Psychiatric:        Mood and Affect: Mood normal.        Behavior: Behavior normal.        Thought Content: Thought content normal.        Judgment: Judgment normal.      No results found for any visits on 05/21/19.     Assessment & Plan     1. Fatty metamorphosis of liver H/o this.  Diet controlled. Will check labs as below and f/u pending results. - CBC w/Diff/Platelet - Comprehensive Metabolic Panel (CMET) - Lipid Profile  2. Adult hypothyroidism Reports some fatigue. On Levothyroxine 161mcg. Will check labs as below and f/u pending results. - CBC w/Diff/Platelet - TSH  3. Type 2 diabetes mellitus without complication, without long-term current use of insulin (HCC) Diet controlled. Will check labs as below and f/u pending results. - CBC w/Diff/Platelet - Comprehensive Metabolic Panel (CMET) - HgB A1c  4. Iron deficiency anemia secondary to inadequate dietary iron intake H/O this. Worsens with her menstrual cycle. Tries to  take iron supplement when on menstrual cycle but forgets sometimes. Also with fatigue will check labs and f/u pending results.  - CBC w/Diff/Platelet - Fe+TIBC+Fer  5. Hypercholesteremia H/O this. Diet controlled. Will check labs as below and f/u pending results. - CBC w/Diff/Platelet - Comprehensive Metabolic Panel (CMET) - Lipid Profile   I discussed the assessment and treatment plan with the patient. The patient was provided an opportunity to ask questions and all were answered. The patient agreed with the plan and demonstrated an understanding of the instructions.   The patient was advised to call back or seek an in-person evaluation if the symptoms worsen or if the condition fails to improve as anticipated.  I provided 12 minutes of non-face-to-face time during this encounter.    Mar Daring, PA-C  [facility]

## 2019-05-21 ENCOUNTER — Encounter: Payer: Self-pay | Admitting: Physician Assistant

## 2019-05-21 ENCOUNTER — Telehealth (INDEPENDENT_AMBULATORY_CARE_PROVIDER_SITE_OTHER): Payer: 59 | Admitting: Physician Assistant

## 2019-05-21 DIAGNOSIS — D508 Other iron deficiency anemias: Secondary | ICD-10-CM

## 2019-05-21 DIAGNOSIS — E039 Hypothyroidism, unspecified: Secondary | ICD-10-CM

## 2019-05-21 DIAGNOSIS — E119 Type 2 diabetes mellitus without complications: Secondary | ICD-10-CM | POA: Diagnosis not present

## 2019-05-21 DIAGNOSIS — K760 Fatty (change of) liver, not elsewhere classified: Secondary | ICD-10-CM

## 2019-05-21 DIAGNOSIS — E7800 Pure hypercholesterolemia, unspecified: Secondary | ICD-10-CM

## 2019-08-26 ENCOUNTER — Telehealth: Payer: Self-pay

## 2019-08-26 ENCOUNTER — Encounter: Payer: Self-pay | Admitting: Emergency Medicine

## 2019-08-26 ENCOUNTER — Other Ambulatory Visit: Payer: Self-pay

## 2019-08-26 ENCOUNTER — Emergency Department: Payer: 59

## 2019-08-26 ENCOUNTER — Encounter: Payer: Self-pay | Admitting: Physician Assistant

## 2019-08-26 DIAGNOSIS — E039 Hypothyroidism, unspecified: Secondary | ICD-10-CM | POA: Insufficient documentation

## 2019-08-26 DIAGNOSIS — E119 Type 2 diabetes mellitus without complications: Secondary | ICD-10-CM | POA: Diagnosis not present

## 2019-08-26 DIAGNOSIS — R05 Cough: Secondary | ICD-10-CM | POA: Diagnosis not present

## 2019-08-26 DIAGNOSIS — U071 COVID-19: Secondary | ICD-10-CM | POA: Diagnosis not present

## 2019-08-26 DIAGNOSIS — Z79899 Other long term (current) drug therapy: Secondary | ICD-10-CM | POA: Insufficient documentation

## 2019-08-26 DIAGNOSIS — R0602 Shortness of breath: Secondary | ICD-10-CM | POA: Insufficient documentation

## 2019-08-26 LAB — CBC WITH DIFFERENTIAL/PLATELET
Abs Immature Granulocytes: 0.02 10*3/uL (ref 0.00–0.07)
Basophils Absolute: 0 10*3/uL (ref 0.0–0.1)
Basophils Relative: 0 %
Eosinophils Absolute: 0.1 10*3/uL (ref 0.0–0.5)
Eosinophils Relative: 1 %
HCT: 29.6 % — ABNORMAL LOW (ref 36.0–46.0)
Hemoglobin: 8.7 g/dL — ABNORMAL LOW (ref 12.0–15.0)
Immature Granulocytes: 0 %
Lymphocytes Relative: 19 %
Lymphs Abs: 1 10*3/uL (ref 0.7–4.0)
MCH: 20.9 pg — ABNORMAL LOW (ref 26.0–34.0)
MCHC: 29.4 g/dL — ABNORMAL LOW (ref 30.0–36.0)
MCV: 71.2 fL — ABNORMAL LOW (ref 80.0–100.0)
Monocytes Absolute: 0.4 10*3/uL (ref 0.1–1.0)
Monocytes Relative: 8 %
Neutro Abs: 3.8 10*3/uL (ref 1.7–7.7)
Neutrophils Relative %: 72 %
Platelets: 261 10*3/uL (ref 150–400)
RBC: 4.16 MIL/uL (ref 3.87–5.11)
RDW: 17.8 % — ABNORMAL HIGH (ref 11.5–15.5)
WBC: 5.2 10*3/uL (ref 4.0–10.5)
nRBC: 0 % (ref 0.0–0.2)

## 2019-08-26 LAB — COMPREHENSIVE METABOLIC PANEL
ALT: 11 U/L (ref 0–44)
AST: 17 U/L (ref 15–41)
Albumin: 3.4 g/dL — ABNORMAL LOW (ref 3.5–5.0)
Alkaline Phosphatase: 51 U/L (ref 38–126)
Anion gap: 11 (ref 5–15)
BUN: 10 mg/dL (ref 6–20)
CO2: 25 mmol/L (ref 22–32)
Calcium: 8.7 mg/dL — ABNORMAL LOW (ref 8.9–10.3)
Chloride: 102 mmol/L (ref 98–111)
Creatinine, Ser: 0.69 mg/dL (ref 0.44–1.00)
GFR calc Af Amer: >60 mL/min (ref >60)
GFR calc non Af Amer: >60 mL/min (ref >60)
Glucose, Bld: 146 mg/dL — ABNORMAL HIGH (ref 70–99)
Potassium: 3.7 mmol/L (ref 3.5–5.1)
Sodium: 138 mmol/L (ref 135–145)
Total Bilirubin: 0.6 mg/dL (ref 0.3–1.2)
Total Protein: 7.7 g/dL (ref 6.5–8.1)

## 2019-08-26 LAB — TROPONIN I (HIGH SENSITIVITY): Troponin I (High Sensitivity): 4 ng/L (ref <18)

## 2019-08-26 NOTE — ED Triage Notes (Addendum)
Patient ambulatory to triage with steady gait, without difficulty or distress noted, mask in place; Since Tuesday having prod cough green sputum accomp by [facility] and mid back pain; +COVID on Sunday; pt st "I bought a pulse ox to use at home and it said my oxygen was low so my sister said I should come"; st oxim was 92% at home

## 2019-08-26 NOTE — Telephone Encounter (Signed)
Left message to schedule appointment with a different provider.   Thanks,   -Mickel Baas

## 2019-08-27 ENCOUNTER — Ambulatory Visit (INDEPENDENT_AMBULATORY_CARE_PROVIDER_SITE_OTHER): Payer: 59 | Admitting: Physician Assistant

## 2019-08-27 ENCOUNTER — Encounter (HOSPITAL_COMMUNITY): Payer: Self-pay

## 2019-08-27 ENCOUNTER — Inpatient Hospital Stay (HOSPITAL_COMMUNITY)
Admission: EM | Admit: 2019-08-27 | Discharge: 2019-08-31 | DRG: 177 | Disposition: A | Discharge status: Home / Self Care | Payer: 59 | Source: Other Acute Inpatient Hospital | Attending: Internal Medicine | Admitting: Internal Medicine

## 2019-08-27 ENCOUNTER — Emergency Department
Admission: EM | Admit: 2019-08-27 | Discharge: 2019-08-27 | Disposition: A | Discharge status: Other Acute Inpatient Hospital | Payer: 59 | Attending: Student | Admitting: Student

## 2019-08-27 DIAGNOSIS — R439 Unspecified disturbances of smell and taste: Secondary | ICD-10-CM | POA: Diagnosis present

## 2019-08-27 DIAGNOSIS — J9601 Acute respiratory failure with hypoxia: Secondary | ICD-10-CM | POA: Diagnosis present

## 2019-08-27 DIAGNOSIS — R0602 Shortness of breath: Secondary | ICD-10-CM

## 2019-08-27 DIAGNOSIS — E063 Autoimmune thyroiditis: Secondary | ICD-10-CM | POA: Diagnosis present

## 2019-08-27 DIAGNOSIS — E119 Type 2 diabetes mellitus without complications: Secondary | ICD-10-CM | POA: Diagnosis present

## 2019-08-27 DIAGNOSIS — R05 Cough: Secondary | ICD-10-CM

## 2019-08-27 DIAGNOSIS — K769 Liver disease, unspecified: Secondary | ICD-10-CM | POA: Diagnosis present

## 2019-08-27 DIAGNOSIS — D508 Other iron deficiency anemias: Secondary | ICD-10-CM | POA: Diagnosis present

## 2019-08-27 DIAGNOSIS — U071 COVID-19: Principal | ICD-10-CM | POA: Diagnosis present

## 2019-08-27 DIAGNOSIS — J1282 Pneumonia due to coronavirus disease 2019: Secondary | ICD-10-CM

## 2019-08-27 DIAGNOSIS — Z9119 Patient's noncompliance with other medical treatment and regimen: Secondary | ICD-10-CM | POA: Diagnosis not present

## 2019-08-27 DIAGNOSIS — Z6841 Body Mass Index (BMI) 40.0 and over, adult: Secondary | ICD-10-CM | POA: Diagnosis not present

## 2019-08-27 DIAGNOSIS — Z23 Encounter for immunization: Secondary | ICD-10-CM | POA: Diagnosis not present

## 2019-08-27 DIAGNOSIS — R059 Cough, unspecified: Secondary | ICD-10-CM

## 2019-08-27 DIAGNOSIS — D509 Iron deficiency anemia, unspecified: Secondary | ICD-10-CM | POA: Diagnosis present

## 2019-08-27 DIAGNOSIS — Z8709 Personal history of other diseases of the respiratory system: Secondary | ICD-10-CM | POA: Diagnosis present

## 2019-08-27 DIAGNOSIS — Z8616 Personal history of COVID-19: Secondary | ICD-10-CM | POA: Diagnosis present

## 2019-08-27 DIAGNOSIS — Z809 Family history of malignant neoplasm, unspecified: Secondary | ICD-10-CM | POA: Diagnosis not present

## 2019-08-27 DIAGNOSIS — Z8249 Family history of ischemic heart disease and other diseases of the circulatory system: Secondary | ICD-10-CM

## 2019-08-27 DIAGNOSIS — J1289 Other viral pneumonia: Secondary | ICD-10-CM | POA: Diagnosis present

## 2019-08-27 DIAGNOSIS — N920 Excessive and frequent menstruation with regular cycle: Secondary | ICD-10-CM | POA: Diagnosis present

## 2019-08-27 DIAGNOSIS — Z5329 Procedure and treatment not carried out because of patient's decision for other reasons: Secondary | ICD-10-CM

## 2019-08-27 DIAGNOSIS — E039 Hypothyroidism, unspecified: Secondary | ICD-10-CM | POA: Diagnosis not present

## 2019-08-27 DIAGNOSIS — E66813 Obesity, class 3: Secondary | ICD-10-CM

## 2019-08-27 LAB — FERRITIN: Ferritin: 20 ng/mL (ref 11–307)

## 2019-08-27 LAB — PROCALCITONIN: Procalcitonin: <0.1 ng/mL

## 2019-08-27 LAB — PROTIME-INR
INR: 1 (ref 0.8–1.2)
Prothrombin Time: 12.8 seconds (ref 11.4–15.2)

## 2019-08-27 LAB — FIBRIN DERIVATIVES D-DIMER (ARMC ONLY): Fibrin derivatives D-dimer (ARMC): 500.78 ng/mL (FEU) — ABNORMAL HIGH (ref 0.00–499.00)

## 2019-08-27 LAB — C-REACTIVE PROTEIN: CRP: 12.9 mg/dL — ABNORMAL HIGH (ref <1.0)

## 2019-08-27 LAB — GLUCOSE, CAPILLARY
Glucose-Capillary: 205 mg/dL — ABNORMAL HIGH (ref 70–99)
Glucose-Capillary: 221 mg/dL — ABNORMAL HIGH (ref 70–99)

## 2019-08-27 LAB — LACTATE DEHYDROGENASE: LDH: 168 U/L (ref 98–192)

## 2019-08-27 LAB — APTT: aPTT: 33 seconds (ref 24–36)

## 2019-08-27 LAB — PREGNANCY, URINE: Preg Test, Ur: NEGATIVE

## 2019-08-27 MED ORDER — ACETAMINOPHEN 325 MG PO TABS
650.0000 mg | ORAL_TABLET | Freq: Four times a day (QID) | ORAL | Status: DC | PRN
  Administered 2019-08-28: 650 mg via ORAL
  Filled 2019-08-27: qty 2

## 2019-08-27 MED ORDER — SODIUM CHLORIDE 0.9 % IV SOLN
200.0000 mg | Freq: Once | INTRAVENOUS | Status: AC
  Administered 2019-08-27: 200 mg via INTRAVENOUS
  Filled 2019-08-27: qty 40

## 2019-08-27 MED ORDER — HYDROCOD POLST-CPM POLST ER 10-8 MG/5ML PO SUER
5.0000 mL | Freq: Two times a day (BID) | ORAL | Status: DC | PRN
  Administered 2019-08-28 – 2019-08-30 (×3): 5 mL via ORAL
  Filled 2019-08-27 (×3): qty 5

## 2019-08-27 MED ORDER — ONDANSETRON HCL 4 MG PO TABS: 4.0000 mg | ORAL_TABLET | Freq: Four times a day (QID) | ORAL | Status: DC | PRN

## 2019-08-27 MED ORDER — BISMUTH SUBSALICYLATE 262 MG PO CHEW
524.0000 mg | CHEWABLE_TABLET | Freq: Once | ORAL | Status: DC
  Filled 2019-08-27: qty 2

## 2019-08-27 MED ORDER — VITAMIN C 500 MG PO TABS
500.0000 mg | ORAL_TABLET | Freq: Every day | ORAL | Status: DC
  Administered 2019-08-27 – 2019-08-31 (×5): 500 mg via ORAL
  Filled 2019-08-27 (×5): qty 1

## 2019-08-27 MED ORDER — ENOXAPARIN SODIUM 60 MG/0.6ML ~~LOC~~ SOLN
60.0000 mg | SUBCUTANEOUS | Status: DC
  Administered 2019-08-27 – 2019-08-30 (×4): 60 mg via SUBCUTANEOUS
  Filled 2019-08-27 (×5): qty 0.6

## 2019-08-27 MED ORDER — INFLUENZA VAC SPLIT QUAD 0.5 ML IM SUSY
0.5000 mL | PREFILLED_SYRINGE | INTRAMUSCULAR | Status: AC
  Administered 2019-08-28: 09:00:00 0.5 mL via INTRAMUSCULAR
  Filled 2019-08-27: qty 0.5

## 2019-08-27 MED ORDER — LEVOTHYROXINE SODIUM 50 MCG PO TABS
100.0000 ug | ORAL_TABLET | Freq: Every day | ORAL | Status: DC
  Administered 2019-08-28: 100 ug via ORAL
  Filled 2019-08-27: qty 2

## 2019-08-27 MED ORDER — ONDANSETRON HCL 4 MG/2ML IJ SOLN: 4.0000 mg | Freq: Four times a day (QID) | INTRAMUSCULAR | Status: DC | PRN

## 2019-08-27 MED ORDER — ALBUTEROL SULFATE HFA 108 (90 BASE) MCG/ACT IN AERS
2.0000 | INHALATION_SPRAY | Freq: Four times a day (QID) | RESPIRATORY_TRACT | Status: DC
  Administered 2019-08-27 – 2019-08-31 (×14): 2 via RESPIRATORY_TRACT
  Filled 2019-08-27: qty 6.7

## 2019-08-27 MED ORDER — SODIUM CHLORIDE 0.9 % IV SOLN: 200.0000 mg | Freq: Once | INTRAVENOUS | Status: DC

## 2019-08-27 MED ORDER — DEXAMETHASONE SODIUM PHOSPHATE 10 MG/ML IJ SOLN
10.0000 mg | Freq: Once | INTRAMUSCULAR | Status: AC
  Administered 2019-08-27: 10 mg via INTRAVENOUS
  Filled 2019-08-27: qty 1

## 2019-08-27 MED ORDER — GUAIFENESIN-DM 100-10 MG/5ML PO SYRP
10.0000 mL | ORAL_SOLUTION | ORAL | Status: DC | PRN
  Administered 2019-08-29: 10 mL via ORAL
  Filled 2019-08-27: qty 10

## 2019-08-27 MED ORDER — SODIUM CHLORIDE 0.9 % IV SOLN: 100.0000 mg | INTRAVENOUS | Status: DC

## 2019-08-27 MED ORDER — PNEUMOCOCCAL VAC POLYVALENT 25 MCG/0.5ML IJ INJ
0.5000 mL | INJECTION | INTRAMUSCULAR | Status: AC
  Administered 2019-08-28: 0.5 mL via INTRAMUSCULAR
  Filled 2019-08-27: qty 0.5

## 2019-08-27 MED ORDER — ZINC SULFATE 220 (50 ZN) MG PO CAPS
220.0000 mg | ORAL_CAPSULE | Freq: Every day | ORAL | Status: DC
  Administered 2019-08-27 – 2019-08-31 (×5): 220 mg via ORAL
  Filled 2019-08-27 (×5): qty 1

## 2019-08-27 MED ORDER — INSULIN ASPART 100 UNIT/ML ~~LOC~~ SOLN
0.0000 [IU] | Freq: Three times a day (TID) | SUBCUTANEOUS | Status: DC
  Administered 2019-08-27: 5 [IU] via SUBCUTANEOUS
  Administered 2019-08-28: 08:00:00 3 [IU] via SUBCUTANEOUS
  Administered 2019-08-28 (×2): 5 [IU] via SUBCUTANEOUS
  Administered 2019-08-29 (×2): 8 [IU] via SUBCUTANEOUS
  Administered 2019-08-29: 09:00:00 5 [IU] via SUBCUTANEOUS
  Administered 2019-08-30: 8 [IU] via SUBCUTANEOUS
  Administered 2019-08-30: 09:00:00 5 [IU] via SUBCUTANEOUS
  Administered 2019-08-31 (×2): 3 [IU] via SUBCUTANEOUS

## 2019-08-27 MED ORDER — BISMUTH SUBSALICYLATE 262 MG/15ML PO SUSP
30.0000 mL | Freq: Once | ORAL | Status: AC
  Administered 2019-08-27: 30 mL via ORAL
  Filled 2019-08-27: qty 118

## 2019-08-27 MED ORDER — SODIUM CHLORIDE 0.9 % IV SOLN
100.0000 mg | INTRAVENOUS | Status: AC
  Administered 2019-08-28 – 2019-08-31 (×4): 100 mg via INTRAVENOUS
  Filled 2019-08-27 (×4): qty 100

## 2019-08-27 MED ORDER — DEXAMETHASONE SODIUM PHOSPHATE 10 MG/ML IJ SOLN
6.0000 mg | Freq: Two times a day (BID) | INTRAMUSCULAR | Status: DC
  Administered 2019-08-27 – 2019-08-30 (×6): 6 mg via INTRAVENOUS
  Filled 2019-08-27 (×6): qty 1

## 2019-08-27 NOTE — ED Notes (Addendum)
Pt transferring to [facility] via [facility] now. All belongings sent with patient.

## 2019-08-27 NOTE — ED Notes (Addendum)
Ambulatory sats 96-100% room air

## 2019-08-27 NOTE — ED Notes (Signed)
Answered call bell for pt to be unhooked and use restroom. Pt educated on how to hook herself back up.

## 2019-08-27 NOTE — ED Notes (Addendum)
Called pharmacy for remdesivir, says med needs to be made at [facility] then sent to [facility]

## 2019-08-27 NOTE — H&P (Signed)
[facility] History and Physical  Kristin Chavez VQQ:[id number] DOB: 01-28-1971 DOA: 08/27/2019   PCP: Mar Daring, PA-C  Specialists: None  Chief Complaint: Shortness of breath with cough  HPI: Kristin Chavez is a 48 y.o. female with a past medical history of Hashimoto's thyroiditis who has not taken her levothyroxine for a few months, history of anemia secondary to menorrhagia, obesity, was diagnosed with COVID-19 a few days ago.  She started developing symptoms last week.  She had cough fever.  Over the course of the week her symptoms got worse.  She checked her pulse oximetry at home and saw that her saturations were 92%.  She had fever.  She reports loss of taste and smell.  She has had loose stools.  She also experienced fatigue.  She has had a dry cough.  Some dizziness but no syncopal episodes.  Denies any swelling in the leg.  No chest pain.   In the emergency department she underwent chest x-ray which showed multifocal opacities.  She has low oxygen saturations although it is not clear if she was truly hypoxic.  She was found to have elevated inflammatory markers.  Due to her obesity she is considered high risk for decompensation.  She will benefit from hospitalization.    Home Medications: Prior to Admission medications   Medication Sig Start Date End Date Taking? Authorizing Provider  dicyclomine (BENTYL) 20 MG tablet Take 1 tablet (20 mg total) by mouth 3 (three) times daily as needed (for abdominal pain). Patient not taking: Reported on 12/04/2018 02/12/18   Mar Daring, PA-C  ferrous sulfate (EQL SLOW RELEASE IRON) 160 (50 Fe) MG TBCR SR tablet Take 1 tablet (160 mg total) by mouth every other day. 12/04/18   Earleen Newport, MD  levothyroxine (SYNTHROID, LEVOTHROID) 100 MCG tablet Take 100 mcg by mouth daily before breakfast.    [provider]    Allergies: No Known Allergies  Past Medical History: Past Medical History:  Diagnosis Date   . Hypothyroidism   . Liver disease, chronic   . Thyroid disease   . Thyroid disease     Past Surgical History:  Procedure Laterality Date  . CESAREAN SECTION    . COLONOSCOPY WITH PROPOFOL N/A 04/01/2016   Procedure: COLONOSCOPY WITH PROPOFOL;  Surgeon: Lollie Sails, MD;  [facility] END[facility]COPY;  Service: Endoscopy;  Laterality: N/A;  . Hx of laparoscopy  1998    Social History: She lives with her husband.  No smoking alcohol use or illicit drug use.  She works as a Network engineer.  Usually independent with daily activities.   Family History:  Family History  Problem Relation Age of Onset  . Hypertension Mother   . Cancer Paternal Grandmother      Review of Systems - History obtained from the patient General ROS: positive for  - chills, fatigue and fever Psychological ROS: negative Ophthalmic ROS: negative ENT ROS: negative Allergy and Immunology ROS: negative Hematological and Lymphatic ROS: negative Endocrine ROS: negative Respiratory ROS: As in HPI Cardiovascular ROS: As in HPI Gastrointestinal ROS: Loose stools Genito-Urinary ROS: no dysuria, trouble voiding, or hematuria Musculoskeletal ROS: negative Neurological ROS: no TIA or stroke symptoms Dermatological ROS: negative  Physical Examination  Vitals:   08/27/19 1508 08/27/19 1537  BP: (!) 161/73   Pulse: 85   Resp: 19   Temp: 99.1 F (37.3 C)   TempSrc: Oral   SpO2: 98%   Weight:  121.4 kg  Height:  5'  6" (1.676 m)    BP (!) 161/73 (BP Location: Right Wrist)   Pulse 85   Temp 99.1 F (37.3 C) (Oral)   Resp 19   Ht 5\' 6"  (1.676 m)   Wt 121.4 kg   LMP 08/05/2019   SpO2 98%   BMI 43.20 kg/m   General appearance: alert, cooperative, appears stated age, no distress and moderately obese Head: Normocephalic, without obvious abnormality, atraumatic Eyes: conjunctivae/corneas clear. PERRL, EOM's intact.  Throat: lips, mucosa, and tongue normal; teeth and gums normal Neck: no adenopathy, no  carotid bruit, no JVD, supple, symmetrical, trachea midline and thyroid not enlarged, symmetric, no tenderness/mass/nodules Resp: Mildly tachypneic at rest.  No use of accessory muscles.  Coarse breath sounds bilaterally with crackles at the bases.  No wheezing or rhonchi. Cardio: regular rate and rhythm, S1, S2 normal, no murmur, click, rub or gallop GI: soft, non-tender; bowel sounds normal; no masses,  no organomegaly Extremities: extremities normal, atraumatic, no cyanosis or edema Pulses: 2+ and symmetric Skin: Skin color, texture, turgor normal. No rashes or lesions Lymph nodes: Cervical, supraclavicular, and axillary nodes normal. Neurologic: Alert and oriented x3.  No focal neurological deficits.   Labs on Admission: I have personally reviewed following labs and imaging studies  CBC: Recent Labs  Lab 08/26/19 2046  WBC 5.2  NEUTROABS 3.8  HGB 8.7*  HCT 29.6*  MCV 71.2*  PLT 261   Basic Metabolic Panel: Recent Labs  Lab 08/26/19 2046  NA 138  K 3.7  CL 102  CO2 25  GLUCOSE 146*  BUN 10  CREATININE 0.69  CALCIUM 8.7*   GFR: Estimated Creatinine Clearance: 112.9 mL/min (by C-G formula based on SCr of 0.69 mg/dL). Liver Function Tests: Recent Labs  Lab 08/26/19 2046  AST 17  ALT 11  ALKPHOS 51  BILITOT 0.6  PROT 7.7  ALBUMIN 3.4*   Coagulation Profile: Recent Labs  Lab 08/27/19 0043  INR 1.0   Anemia Panel: Recent Labs    08/27/19 0043  FERRITIN 20     Radiological Exams on Admission: Dg Chest 2 View  Result Date: 08/26/2019 CLINICAL DATA:  Post cough, COVID-19 positive EXAM: CHEST - 2 VIEW COMPARISON:  Radiograph 12/04/2018 FINDINGS: Widespread multifocal interstitial and patchy airspace opacities throughout both lungs. No pneumothorax. No effusion. Cardiac size at the upper limits of normal. Cardiomediastinal contours otherwise unremarkable. No acute osseous or soft tissue abnormality. IMPRESSION: Widespread multifocal interstitial and patchy  airspace opacities throughout both lungs, consistent with multifocal pneumonia. Electronically Signed   By: 12/06/2018 M.D.   On: 08/26/2019 21:38    My interpretation of Electrocardiogram: Sinus tachycardia at 102 bpm.  Normal axis.  Intervals are normal.  No concerning ST or T wave changes.   Problem List  Principal Problem:   Pneumonia due to COVID-19 virus Active Problems:   Adult hypothyroidism   Obesity, Class III, BMI 40-49.9 (morbid obesity) (HCC)   Iron deficiency anemia secondary to inadequate dietary iron intake   Acute respiratory failure with hypoxia (HCC)   Assessment: This is a 48 year old Caucasian female with past medical history as stated earlier who comes in with shortness of breath, cough, fever.  She is found to have multifocal pneumonia on chest x-ray.  She tested positive for Covid 19 last week.  She is thought to be at high risk for decompensation due to her obesity.  She would benefit from hospitalization.  Plan:  1. Pneumonia due to COVID-19: She is currently on 2 L  of oxygen saturating about 97-98%.  It does not appear that she was truly hypoxic in the emergency department.  We should be able to wean her off of oxygen.  CRP was noted to be 12.9.  Ferritin 20.  LDH 168.  Procalcitonin less than 0.1.  Troponin was normal.  LFTs unremarkable.  WBC is normal.  D dimer slightly above the normal range.  We will start the patient on remdesivir and higher dose of steroids.  She reports a history of fatty liver and is hesitant to consider Actemra which is reasonable.  She was also offered convalescent plasma.  She was given the consent form.  She would like to think about it and will get back to us tomorrow.  Continue to trend inflammatory markers, D-dimer, ferritin.  Continue to monitor LFTs.  2. History of Hashimoto's thyroiditis/hypothyroidism: She has not taken her levothyroxine in a few months.  We will check TSH and free T4 tomorrow.  Start on her levothyroxine at her  previous dose from tomorrow morning.  3.  Iron deficiency anemia: Hemoglobin noted to be 8.7.  She reports heavy periods, which is likely the reason for anemia.  Will check anemia panel in the morning.  Will need to consider iron supplementation.  4.  Menorrhagia: Longstanding history of same.  LMP was 3 weeks ago.  I do not see any pregnancy test results in the computer.  We will order this STAT.  5. Obesity: Estimated body mass index is 43.2 kg/m as calculated from the following:   Height as of this encounter: 5\' 6"  (1.676 m).   Weight as of this encounter: 121.4 kg.  6. Diabetes mellitus type 2 in obesity: There is mention of a history of diabetes 6.7 in May 2019.  Does not appear like patient is on any medications for same at home.  We will check HbA1c in the morning.  Placed on sliding scale coverage.  Anticipate worsening in her glycemic control due to steroids.  She may need long-acting agents as well.  DVT Prophylaxis: Lovenox weight-based Code Status: Full code Family Communication: Discussed with the patient Disposition: MedSurg Consults called: None Admission Status: Inpatient  Severity of Illness: The appropriate patient status for this patient is INPATIENT. Inpatient status is judged to be reasonable and necessary in order to provide the required intensity of service to ensure the patient's safety. The patient's presenting symptoms, physical exam findings, and initial radiographic and laboratory data in the context of their chronic comorbidities is felt to place them at high risk for further clinical deterioration. Furthermore, it is not anticipated that the patient will be medically stable for discharge from the hospital within 2 midnights of admission. The following factors support the patient status of inpatient.   " The patient's presenting symptoms include shortness of breath. " The worrisome physical exam findings include crackles in the lungs. " The initial radiographic  and laboratory data are worrisome because of pneumonia on chest x-ray. " The chronic co-morbidities include obesity.   * I certify that at the point of admission it is my clinical judgment that the patient will require inpatient hospital care spanning beyond 2 midnights from the point of admission due to high intensity of service, high risk for further deterioration and high frequency of surveillance required.*  Further management decisions will depend on results of further testing and patient's response to treatment.   Aroush Chasse [facility]  [facility] Pager on [email or web address]  08/27/2019, 4:42 PM

## 2019-08-27 NOTE — ED Provider Notes (Signed)
[facility] Emergency Department Provider Note  ____________________________________________   First MD Initiated Contact with Patient 08/27/19 0014     (approximate)  I have reviewed the triage vital signs and the nursing notes.  History  Chief Complaint Shortness of Breath    HPI Kristin Chavez is a 48 y.o. female with history of obesity, DM2, iron deficiency anemia, thyroid disease who presents to the emergency department for cough and shortness of breath, in the setting of known + COVID.  Patient reports she is on day 8 of symptoms. She was tested on 11/11 and received her positive result on 11/15. Patient reports she checked her pulse ox at home and it was noted to be 92%, which prompted her to be evaluated. She reports fevers, nonproductive cough, shortness of breath, extreme fatigue with exertion, loss of taste/smell, and loose stools.  She denies any history of VTE.  She denies any history of asthma, COPD, or tobacco use.   Past Medical Hx Past Medical History:  Diagnosis Date  . Hypothyroidism   . Liver disease, chronic   . Thyroid disease   . Thyroid disease     Problem List Patient Active Problem List   Diagnosis Date Noted  . Iron deficiency anemia secondary to inadequate dietary iron intake 02/15/2018  . Type 2 diabetes mellitus without complication, without long-term current use of insulin (HCC) 02/15/2018  . Genital warts 02/12/2018  . BMI 45.0-49.9, adult (HCC) 02/12/2018  . 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 Snores 01/15/2016  . Avitaminosis D 01/15/2016  . Contusion of foot 01/11/2010  . Hay fever 07/28/2009  . Fatty metamorphosis of liver 02/27/2009  . Fatty infiltration of liver 02/27/2009  . Adiposity 07/11/2008  . Adult hypothyroidism 05/10/2007    Past Surgical Hx Past Surgical History:  Procedure Laterality Date  . CESAREAN SECTION    . COLONOSCOPY WITH PROPOFOL N/A 04/01/2016   Procedure: COLONOSCOPY WITH  PROPOFOL;  Surgeon: Christena Deem, MD;  Location: [facility] [place] ENDOSCOPY;  Service: Endoscopy;  Laterality: N/A;  . Hx of laparoscopy  1998    Medications Prior to Admission medications   Medication Sig Start Date End Date Taking? Authorizing Provider  dicyclomine (BENTYL) 20 MG tablet Take 1 tablet (20 mg total) by mouth 3 (three) times daily as needed (for abdominal pain). Patient not taking: Reported on 12/04/2018 02/12/18   Margaretann Loveless, PA-C  ferrous sulfate (EQL SLOW RELEASE IRON) 160 (50 Fe) MG TBCR SR tablet Take 1 tablet (160 mg total) by mouth every other day. 12/04/18   Emily Filbert, MD  levothyroxine (SYNTHROID, LEVOTHROID) 100 MCG tablet Take 100 mcg by mouth daily before breakfast.    [provider]    Allergies Patient has no known allergies.  Family Hx Family History  Problem Relation Age of Onset  . Hypertension Mother   . Cancer Paternal Grandmother     Social Hx Social History   Tobacco Use  . Smoking status: Never Smoker  . Smokeless tobacco: Never Used  Substance Use Topics  . Alcohol use: No  . Drug use: No     Review of Systems  Constitutional: + fever, fatigue Eyes: Negative for visual changes. ENT: Negative for sore throat. Cardiovascular: Negative for chest pain. Respiratory: + for shortness of breath, cough. Gastrointestinal: + for nausea, loose stool.  Genitourinary: Negative for dysuria. Musculoskeletal: Negative for leg swelling. Skin: Negative for rash. Neurological: Negative for for headaches.   Physical Exam  Vital Signs: ED  Triage Vitals  Enc Vitals Group     BP 08/26/19 2038 (!) 184/80     Pulse Rate 08/26/19 2038 (!) 106     Resp 08/26/19 2038 18     Temp 08/26/19 2038 99.9 F (37.7 C)     Temp Source 08/26/19 2038 Oral     SpO2 08/26/19 2038 96 %     Weight 08/26/19 2039 295 lb (133.8 kg)     Height 08/26/19 2039 5\' 6"  (1.676 m)     Head Circumference --      Peak Flow --      Pain Score 08/26/19  2041 3     Pain Loc --      Pain Edu? --      Excl. in [place]? --     Constitutional: Alert and oriented.  Appears fatigued. Head: Normocephalic. Atraumatic. Eyes: Conjunctivae clear. Sclera anicteric. Nose: No congestion. No rhinorrhea. Mouth/Throat: Wearing mask.  Dry cough. Neck: No stridor.   Cardiovascular: HR low 100s. Extremities well perfused. Respiratory: Normal respiratory effort.  Speaking in full sentences.  Oxygen mid to high 90s on RA.  Oxygen drops to 83% on RA during ambulatory trial, with associated tachycardia from low 100s to 130s.  Placed on 2L [place]. Gastrointestinal: Soft. Non-tender. Non-distended.  Musculoskeletal: No lower extremity edema. No deformities. Neurologic:  Normal speech and language. No gross focal neurologic deficits are appreciated.  Skin: Skin is warm, dry and intact. No rash noted. Psychiatric: Mood and affect are appropriate for situation.  EKG  Personally reviewed.   Rate: 102 Rhythm: sinus Axis: normal Intervals: WNL No acute ischemic changes Sinus tachycardia No STEMI    Radiology  XR:  IMPRESSION:  Widespread multifocal interstitial and patchy airspace opacities  throughout both lungs, consistent with multifocal pneumonia.    Procedures  Procedure(s) performed (including critical care):  Procedures   Initial Impression / Assessment and Plan / ED Course  48 y.o. female with history of obesity, DM2, iron deficiency anemia, thyroid disease who presents to the emergency department for worsening cough and shortness of breath, fatigue with exertion, in the setting of positive COVID swab.  Currently on day 8 of symptoms.  On exam, her oxygen mid to high 90s on RA.  Oxygen drops to 83% on RA during ambulatory trial, with associated tachycardia from low 100s to 130s.  Placed on 2L [place].  XR with multifocal interstitial and patchy airspace opacities, c/w multifocal pneumonia.  Will obtain procalcitonin for further differentiation,  however suspect this is COVID related.  Basic labs reveal microcytic anemia. She does have a history of iron deficiency anemia.  Added on inflammatory markers and discussed with [facility], who accepts the patient for admission.  Will give steroids, remdesivir.   Final Clinical Impression(s) / ED Diagnosis  Final diagnoses:  SOB (shortness of breath)  Cough  COVID-19       Note:  This document was prepared using Dragon voice recognition software and may include unintentional dictation errors.   Lilia Pro., MD 08/27/19 [phone]

## 2019-08-27 NOTE — Progress Notes (Signed)
PHARMACY CONSULT: Lovenox for VTE prophylaxis COVID protocol  Wt: 121 kg BMI:  43.2 Scr:  0.69 CrCl >30 ml/hr   A/P:  Due to BMI > 35, begin weight-adjusted lovenox 0.5mg /kg sq q24h  F/u d-dimer, consider increase q12h if > 5   Peggyann Juba, PharmD, [facility]: [phone]

## 2019-08-27 NOTE — Progress Notes (Signed)
Pt admitted from [place]. Stable. On RA for comfort and exertion. A&Ox4. Independent. VSS. IV in place. Oriented to room and staff. Bathed independently. Call bell in reach and pt encouraged to use for any needs. Pt instructed on use of IS and PEP, pt demonstrated effectively. Will continue to monitor.

## 2019-08-27 NOTE — ED Notes (Signed)
EMTALA reviewed. 

## 2019-08-27 NOTE — Progress Notes (Deleted)
{Method of visit:23308}  Patient: Kristin Chavez Female    DOB: [birth year]   48 y.o.   MRN: [id number] Visit Date: 08/27/2019  Today's Provider: Trey Sailors, PA-C   No chief complaint on file.  Subjective:     HPI    No Known Allergies  No current facility-administered medications for this visit.   Current Outpatient Medications:  .  dicyclomine (BENTYL) 20 MG tablet, Take 1 tablet (20 mg total) by mouth 3 (three) times daily as needed (for abdominal pain). (Patient not taking: Reported on 12/04/2018), Disp: 30 tablet, Rfl: 1 .  ferrous sulfate (EQL SLOW RELEASE IRON) 160 (50 Fe) MG TBCR SR tablet, Take 1 tablet (160 mg total) by mouth every other day., Disp: 30 each, Rfl: 6 .  levothyroxine (SYNTHROID, LEVOTHROID) 100 MCG tablet, Take 100 mcg by mouth daily before breakfast., Disp: , Rfl:   Facility-Administered Medications Ordered in Other Visits:  .  [COMPLETED] remdesivir 200 mg in sodium chloride 0.9 % 250 mL IVPB, 200 mg, Intravenous, Once, Stopped at 08/27/19 0430 **FOLLOWED BY** [START ON 08/28/2019] remdesivir 100 mg in sodium chloride 0.9 % 250 mL IVPB, 100 mg, Intravenous, Q24H, Monks, Dema Severin., MD  Review of Systems  Social History   Tobacco Use  . Smoking status: Never Smoker  . Smokeless tobacco: Never Used  Substance Use Topics  . Alcohol use: No      Objective:   LMP 08/05/2019  There were no vitals filed for this visit.There is no height or weight on file to calculate BMI.   Physical Exam   Results for orders placed or performed during the hospital encounter of 08/27/19  CBC with Differential  Result Value Ref Range   WBC 5.2 4.0 - 10.5 K/uL   RBC 4.16 3.87 - 5.11 MIL/uL   Hemoglobin 8.7 (L) 12.0 - 15.0 g/dL   HCT 64.3 (L) 32.9 - 51.8 %   MCV 71.2 (L) 80.0 - 100.0 fL   MCH 20.9 (L) 26.0 - 34.0 pg   MCHC 29.4 (L) 30.0 - 36.0 g/dL   RDW 84.1 (H) 66.0 - 63.0 %   Platelets 261 150 - 400 K/uL   nRBC 0.0 0.0 - 0.2 %   Neutrophils  Relative % 72 %   Neutro Abs 3.8 1.7 - 7.7 K/uL   Lymphocytes Relative 19 %   Lymphs Abs 1.0 0.7 - 4.0 K/uL   Monocytes Relative 8 %   Monocytes Absolute 0.4 0.1 - 1.0 K/uL   Eosinophils Relative 1 %   Eosinophils Absolute 0.1 0.0 - 0.5 K/uL   Basophils Relative 0 %   Basophils Absolute 0.0 0.0 - 0.1 K/uL   Immature Granulocytes 0 %   Abs Immature Granulocytes 0.02 0.00 - 0.07 K/uL  Comprehensive metabolic panel  Result Value Ref Range   Sodium 138 135 - 145 mmol/L   Potassium 3.7 3.5 - 5.1 mmol/L   Chloride 102 98 - 111 mmol/L   CO2 25 22 - 32 mmol/L   Glucose, Bld 146 (H) 70 - 99 mg/dL   BUN 10 6 - 20 mg/dL   Creatinine, Ser 1.60 0.44 - 1.00 mg/dL   Calcium 8.7 (L) 8.9 - 10.3 mg/dL   Total Protein 7.7 6.5 - 8.1 g/dL   Albumin 3.4 (L) 3.5 - 5.0 g/dL   AST 17 15 - 41 U/L   ALT 11 0 - 44 U/L   Alkaline Phosphatase 51 38 - 126 U/L   Total Bilirubin  0.6 0.3 - 1.2 mg/dL   GFR calc non Af Amer >60 >60 mL/min   GFR calc Af Amer >60 >60 mL/min   Anion gap 11 5 - 15  C-reactive protein  Result Value Ref Range   CRP 12.9 (H) <1.0 mg/dL  Fibrin derivatives D-Dimer ([facility] only)  Result Value Ref Range   Fibrin derivatives D-dimer ([facility]) 500.78 (H) 0.00 - 499.00 ng/mL (FEU)  Lactate dehydrogenase  Result Value Ref Range   LDH 168 98 - 192 U/L  Ferritin (Iron Binding Protein)  Result Value Ref Range   Ferritin 20 11 - 307 ng/mL  Procalcitonin  Result Value Ref Range   Procalcitonin <0.10 ng/mL  Protime-INR  Result Value Ref Range   Prothrombin Time 12.8 11.4 - 15.2 seconds   INR 1.0 0.8 - 1.2  APTT  Result Value Ref Range   aPTT 33 24 - 36 seconds  Troponin I (High Sensitivity)  Result Value Ref Range   Troponin I (High Sensitivity) 4 <18 ng/L       Assessment & Plan        Trinna Post, PA-C  [facility]

## 2019-08-27 NOTE — ED Notes (Signed)
Pt sleeping at this time. Even respirations.

## 2019-08-27 NOTE — ED Notes (Signed)
Patient resting comfortably. Voices no needs or concerns. Updated on waiting process for [facility]

## 2019-08-27 NOTE — Progress Notes (Signed)
Remdesivir - Pharmacy Brief Note   O:  ALT: 11 CXR: Widespread multifocal interstitial and patchy airspace opacities throughout both lungs, consistent with multifocal pneumonia. SpO2: 100% on 2L    A/P:  Remdesivir 200 mg IVPB once followed by 100 mg IVPB daily x 4 days.   Tobie Lords, PharmD, BCPS Clinical Pharmacist 08/27/2019 1:07 AM

## 2019-08-27 NOTE — ED Notes (Signed)
Pt given meal tray.

## 2019-08-28 DIAGNOSIS — J9601 Acute respiratory failure with hypoxia: Secondary | ICD-10-CM

## 2019-08-28 LAB — COMPREHENSIVE METABOLIC PANEL
ALT: 12 U/L (ref 0–44)
AST: 13 U/L — ABNORMAL LOW (ref 15–41)
Albumin: 3.7 g/dL (ref 3.5–5.0)
Alkaline Phosphatase: 50 U/L (ref 38–126)
Anion gap: 12 (ref 5–15)
BUN: 15 mg/dL (ref 6–20)
CO2: 23 mmol/L (ref 22–32)
Calcium: 8.9 mg/dL (ref 8.9–10.3)
Chloride: 102 mmol/L (ref 98–111)
Creatinine, Ser: 0.74 mg/dL (ref 0.44–1.00)
GFR calc Af Amer: >60 mL/min (ref >60)
GFR calc non Af Amer: >60 mL/min (ref >60)
Glucose, Bld: 204 mg/dL — ABNORMAL HIGH (ref 70–99)
Potassium: 3.9 mmol/L (ref 3.5–5.1)
Sodium: 137 mmol/L (ref 135–145)
Total Bilirubin: 0.5 mg/dL (ref 0.3–1.2)
Total Protein: 7.9 g/dL (ref 6.5–8.1)

## 2019-08-28 LAB — C-REACTIVE PROTEIN: CRP: 11.8 mg/dL — ABNORMAL HIGH (ref <1.0)

## 2019-08-28 LAB — CBC WITH DIFFERENTIAL/PLATELET
Abs Immature Granulocytes: 0.03 10*3/uL (ref 0.00–0.07)
Basophils Absolute: 0 10*3/uL (ref 0.0–0.1)
Basophils Relative: 0 %
Eosinophils Absolute: 0 10*3/uL (ref 0.0–0.5)
Eosinophils Relative: 0 %
HCT: 30 % — ABNORMAL LOW (ref 36.0–46.0)
Hemoglobin: 8.9 g/dL — ABNORMAL LOW (ref 12.0–15.0)
Immature Granulocytes: 0 %
Lymphocytes Relative: 10 %
Lymphs Abs: 0.8 10*3/uL (ref 0.7–4.0)
MCH: 21.9 pg — ABNORMAL LOW (ref 26.0–34.0)
MCHC: 29.7 g/dL — ABNORMAL LOW (ref 30.0–36.0)
MCV: 73.7 fL — ABNORMAL LOW (ref 80.0–100.0)
Monocytes Absolute: 0.3 10*3/uL (ref 0.1–1.0)
Monocytes Relative: 4 %
Neutro Abs: 6.5 10*3/uL (ref 1.7–7.7)
Neutrophils Relative %: 86 %
Platelets: 325 10*3/uL (ref 150–400)
RBC: 4.07 MIL/uL (ref 3.87–5.11)
RDW: 18.1 % — ABNORMAL HIGH (ref 11.5–15.5)
WBC: 7.6 10*3/uL (ref 4.0–10.5)
nRBC: 0 % (ref 0.0–0.2)

## 2019-08-28 LAB — RETICULOCYTES
Immature Retic Fract: 26.3 % — ABNORMAL HIGH (ref 2.3–15.9)
RBC.: 4.07 MIL/uL (ref 3.87–5.11)
Retic Count, Absolute: 18.7 10*3/uL — ABNORMAL LOW (ref 19.0–186.0)
Retic Ct Pct: 0.5 % (ref 0.4–3.1)

## 2019-08-28 LAB — GLUCOSE, CAPILLARY
Glucose-Capillary: 186 mg/dL — ABNORMAL HIGH (ref 70–99)
Glucose-Capillary: 247 mg/dL — ABNORMAL HIGH (ref 70–99)
Glucose-Capillary: 242 mg/dL — ABNORMAL HIGH (ref 70–99)
Glucose-Capillary: 307 mg/dL — ABNORMAL HIGH (ref 70–99)

## 2019-08-28 LAB — IRON AND TIBC
Iron: 18 ug/dL — ABNORMAL LOW (ref 28–170)
Saturation Ratios: 4 % — ABNORMAL LOW (ref 10.4–31.8)
TIBC: 406 ug/dL (ref 250–450)
UIBC: 388 ug/dL

## 2019-08-28 LAB — ABO/RH: ABO/RH(D): A POS

## 2019-08-28 LAB — VITAMIN B12: Vitamin B-12: 220 pg/mL (ref 180–914)

## 2019-08-28 LAB — FERRITIN: Ferritin: 35 ng/mL (ref 11–307)

## 2019-08-28 LAB — TSH: TSH: 7.174 u[IU]/mL — ABNORMAL HIGH (ref 0.350–4.500)

## 2019-08-28 LAB — D-DIMER, QUANTITATIVE: D-Dimer, Quant: 0.36 ug/mL-FEU (ref 0.00–0.50)

## 2019-08-28 LAB — FOLATE: Folate: 12.5 ng/mL (ref >5.9)

## 2019-08-28 LAB — MAGNESIUM: Magnesium: 2.3 mg/dL (ref 1.7–2.4)

## 2019-08-28 LAB — HEMOGLOBIN A1C
Hgb A1c MFr Bld: 7.4 % — ABNORMAL HIGH (ref 4.8–5.6)
Mean Plasma Glucose: 165.68 mg/dL

## 2019-08-28 LAB — T4, FREE: Free T4: 0.52 ng/dL — ABNORMAL LOW (ref 0.61–1.12)

## 2019-08-28 MED ORDER — PANTOPRAZOLE SODIUM 40 MG PO TBEC
40.0000 mg | DELAYED_RELEASE_TABLET | Freq: Every day | ORAL | Status: DC
  Administered 2019-08-28 – 2019-08-31 (×4): 40 mg via ORAL
  Filled 2019-08-28 (×5): qty 1

## 2019-08-28 MED ORDER — DICYCLOMINE HCL 20 MG PO TABS
20.0000 mg | ORAL_TABLET | Freq: Three times a day (TID) | ORAL | Status: DC
  Administered 2019-08-28: 17:00:00 20 mg via ORAL
  Filled 2019-08-28 (×11): qty 1

## 2019-08-28 MED ORDER — ALUM & MAG HYDROXIDE-SIMETH 200-200-20 MG/5ML PO SUSP
30.0000 mL | Freq: Four times a day (QID) | ORAL | Status: DC | PRN
  Administered 2019-08-28: 30 mL via ORAL
  Filled 2019-08-28 (×2): qty 30

## 2019-08-28 MED ORDER — SODIUM CHLORIDE 0.9 % IV SOLN
510.0000 mg | Freq: Once | INTRAVENOUS | Status: AC
  Administered 2019-08-28: 510 mg via INTRAVENOUS
  Filled 2019-08-28: qty 17

## 2019-08-28 MED ORDER — LEVOTHYROXINE SODIUM 50 MCG PO TABS
50.0000 ug | ORAL_TABLET | Freq: Every day | ORAL | Status: DC
  Administered 2019-08-29 – 2019-08-31 (×3): 50 ug via ORAL
  Filled 2019-08-28 (×3): qty 1

## 2019-08-28 MED ORDER — FUROSEMIDE 10 MG/ML IJ SOLN
40.0000 mg | Freq: Once | INTRAMUSCULAR | Status: AC
  Administered 2019-08-28: 40 mg via INTRAVENOUS
  Filled 2019-08-28: qty 4

## 2019-08-28 NOTE — Plan of Care (Signed)
  Problem: Education: Goal: Knowledge of risk factors and measures for prevention of condition will improve Outcome: Progressing   Problem: Coping: Goal: Psychosocial and spiritual needs will be supported Outcome: Progressing   Problem: Respiratory: Goal: Will maintain a patent airway Outcome: Progressing   

## 2019-08-28 NOTE — Progress Notes (Signed)
PROGRESS NOTE                                                                                                                                                                                                             Patient Demographics:    Kristin Chavez, is a 48 y.o. female, DOB - 1971-05-13, [id number]:[id number]  Outpatient Primary MD for the patient is Rubye Beach   Admit date - 08/27/2019   LOS - 1  No chief complaint on file.      Brief Narrative: Patient is a 48 y.o. female with PMHx of Hashimoto's thyroiditis-noncompliant with levothyroxine, anemia secondary to chronic menorrhagia, obesity-diagnosed with COVID-19 a few days prior to this hospitalization-presented with shortness of breath-found to have acute hypoxic respiratory failure secondary to COVID-19 pneumonia.   Subjective:    [name] today feels much better-she was titrated to room air.   Assessment  & Plan :   Acute Hypoxic Resp Failure due to Covid 19 Viral pneumonia: Improved-titrated to room air this morning.  Continue steroids remdesivir.  Do not think she requires plasma or Actemra at this point.  Fever: afebrile  O2 requirements:  Room air  COVID-19 Labs: Recent Labs    08/27/19 0041 08/27/19 0043 08/28/19 0335  DDIMER  --   --  0.36  FERRITIN  --  20 35  LDH  --  168  --   CRP 12.9*  --  11.8*    No results found for: SARSCOV2NAA   COVID-19 Medications: Steroids: 11/17>> Remdesivir: 11/17>> Actemra: Not indicated Convalescent Plasma:Not indicated  Other medications: Diuretics:Euvolemic-Lasix x1 to maintain negative balance Antibiotics:Not needed as no evidence of bacterial infection  Prone/Incentive Spirometry: encouraged incentive spirometry use 3-4/hour.  DVT Prophylaxis  :  Lovenox   DM-2: Relatively stable-follow closely while on SSI-we will add long-acting insulin if needed.  CBG (last 3)  Recent  Labs    08/27/19 2013 08/28/19 0749 08/28/19 1140  GLUCAP 221* 186* 247*   Hypothyroidism: Secondary to Hashimoto's thyroiditis-noncompliant to levothyroxine for at least 3 months-TSH is only mildly elevated-decrease levothyroxine to 50 mcg.  Follow-up with PCP.  Iron deficiency anemia with microcytosis: Has history of chronic menorrhagia-await anemia panel-probably will require iron supplementation.    DM-2 (A1c 7.4 on 08/28/2019): Continue SSI for now-follow and optimize.  CBG (last 3)  Recent Labs    08/27/19 2013 08/28/19 0749 08/28/19 1140  GLUCAP 221* 186* 247*    Obesity: Estimated body mass index is 43.2 kg/m as calculated from the following:   Height as of this encounter: 5\' 6"  (1.676 m).   Weight as of this encounter: 121.4 kg.   Consults  :  None  Procedures  :  None  ABG: No results found for: PHART, PCO2ART, PO2ART, HCO3, TCO2, ACIDBASEDEF, O2SAT  Vent Settings: N/A  Condition - Stable  Family Communication  : None at bedside-patient to update family.  Code Status :  Full Code  Diet :  Diet Order            Diet regular Room service appropriate? Yes; Fluid consistency: Thin  Diet effective now               Disposition Plan  :  Remain hospitalized  Barriers to discharge: Complete 5 days of IV Remdesivir  Antimicorbials  :    Anti-infectives (From admission, onward)   Start     Dose/Rate Route Frequency Ordered Stop   08/28/19 1000  remdesivir 100 mg in sodium chloride 0.9 % 250 mL IVPB     100 mg 500 mL/hr over 30 Minutes Intravenous Every 24 hours 08/27/19 1619 09/01/19 0959      Inpatient Medications  Scheduled Meds: . albuterol  2 puff Inhalation Q6H  . dexamethasone (DECADRON) injection  6 mg Intravenous Q12H  . enoxaparin (LOVENOX) injection  60 mg Subcutaneous Q24H  . insulin aspart  0-15 Units Subcutaneous TID WC  . [START ON 08/29/2019] levothyroxine  50 mcg Oral QAC breakfast  . pantoprazole  40 mg Oral Daily  . vitamin  C  500 mg Oral Daily  . zinc sulfate  220 mg Oral Daily   Continuous Infusions: . remdesivir 100 mg in NS 250 mL Stopped (08/28/19 1011)   PRN Meds:.acetaminophen, alum & mag hydroxide-simeth, chlorpheniramine-HYDROcodone, guaiFENesin-dextromethorphan, ondansetron **OR** ondansetron (ZOFRAN) IV   Time Spent in minutes  25  See all Orders from today for further details   08/30/19 M.D on 08/28/2019 at 3:11 PM  To page go to [email or web address] - use universal password  [facility] -  Office  [phone]    Objective:   Vitals:   08/27/19 2000 08/28/19 0444 08/28/19 0749 08/28/19 1400  BP: (!) 146/83 (!) 156/98 (!) 185/84 (!) 163/81  Pulse:   79 80  Resp: 16 19 17 20   Temp: 98.4 F (36.9 C) 97.6 F (36.4 C) 98.1 F (36.7 C)   TempSrc: Oral Oral Oral   SpO2:   98% 96%  Weight:      Height:        Wt Readings from Last 3 Encounters:  08/27/19 121.4 kg  08/26/19 133.8 kg  12/04/18 131.5 kg     Intake/Output Summary (Last 24 hours) at 08/28/2019 1511 Last data filed at 08/28/2019 1500 Gross per 24 hour  Intake 950 ml  Output 800 ml  Net 150 ml     Physical Exam Gen Exam:Alert awake-not in any distress HEENT:atraumatic, normocephalic Chest: B/L clear to auscultation anteriorly CVS:S1S2 regular Abdomen:soft non tender, non distended Extremities:no edema Neurology: Non focal Skin: no rash   Data Review:    CBC Recent Labs  Lab 08/26/19 2046 08/28/19 0335  WBC 5.2 7.6  HGB 8.7* 8.9*  HCT 29.6* 30.0*  PLT 261 325  MCV 71.2* 73.7*  MCH 20.9* 21.9*  MCHC 29.4* 29.7*  RDW 17.8* 18.1*  LYMPHSABS 1.0 0.8  MONOABS 0.4 0.3  EOSABS 0.1 0.0  BASOSABS 0.0 0.0    Chemistries  Recent Labs  Lab 08/26/19 2046 08/28/19 0335  NA 138 137  K 3.7 3.9  CL 102 102  CO2 25 23  GLUCOSE 146* 204*  BUN 10 15  CREATININE 0.69 0.74  CALCIUM 8.7* 8.9  MG  --  2.3  AST 17 13*  ALT 11 12  ALKPHOS 51 50  BILITOT 0.6 0.5    ------------------------------------------------------------------------------------------------------------------ No results for input(s): CHOL, HDL, LDLCALC, TRIG, CHOLHDL, LDLDIRECT in the last 72 hours.  Lab Results  Component Value Date   HGBA1C 7.4 (H) 08/28/2019   ------------------------------------------------------------------------------------------------------------------ Recent Labs    08/28/19 0335  TSH 7.174*   ------------------------------------------------------------------------------------------------------------------ Recent Labs    08/27/19 0043 08/28/19 0335  VITAMINB12  --  220  FOLATE  --  12.5  FERRITIN 20 35  TIBC  --  406  IRON  --  18*  RETICCTPCT  --  0.5    Coagulation profile Recent Labs  Lab 08/27/19 0043  INR 1.0    Recent Labs    08/28/19 0335  DDIMER 0.36    Cardiac Enzymes No results for input(s): CKMB, TROPONINI, MYOGLOBIN in the last 168 hours.  Invalid input(s): CK ------------------------------------------------------------------------------------------------------------------ No results found for: BNP  Micro Results No results found for this or any previous visit (from the past 240 hour(s)).  Radiology Reports Dg Chest 2 View  Result Date: 08/26/2019 CLINICAL DATA:  Post cough, COVID-19 positive EXAM: CHEST - 2 VIEW COMPARISON:  Radiograph 12/04/2018 FINDINGS: Widespread multifocal interstitial and patchy airspace opacities throughout both lungs. No pneumothorax. No effusion. Cardiac size at the upper limits of normal. Cardiomediastinal contours otherwise unremarkable. No acute osseous or soft tissue abnormality. IMPRESSION: Widespread multifocal interstitial and patchy airspace opacities throughout both lungs, consistent with multifocal pneumonia. Electronically Signed   By: Kreg ShropshirePrice  DeHay M.D.   On: 08/26/2019 21:38

## 2019-08-28 NOTE — Progress Notes (Signed)
Family updated via pt  

## 2019-08-28 NOTE — Progress Notes (Signed)
Pt states she has facetimed and phone call with family and they are up to date with POC, denies any questions or need for this RN to call. Will continue to monitor.

## 2019-08-29 LAB — CBC WITH DIFFERENTIAL/PLATELET
Abs Immature Granulocytes: 0.11 10*3/uL — ABNORMAL HIGH (ref 0.00–0.07)
Basophils Absolute: 0 10*3/uL (ref 0.0–0.1)
Basophils Relative: 0 %
Eosinophils Absolute: 0 10*3/uL (ref 0.0–0.5)
Eosinophils Relative: 0 %
HCT: 27.2 % — ABNORMAL LOW (ref 36.0–46.0)
Hemoglobin: 7.9 g/dL — ABNORMAL LOW (ref 12.0–15.0)
Immature Granulocytes: 1 %
Lymphocytes Relative: 9 %
Lymphs Abs: 1 10*3/uL (ref 0.7–4.0)
MCH: 21.3 pg — ABNORMAL LOW (ref 26.0–34.0)
MCHC: 29 g/dL — ABNORMAL LOW (ref 30.0–36.0)
MCV: 73.3 fL — ABNORMAL LOW (ref 80.0–100.0)
Monocytes Absolute: 0.7 10*3/uL (ref 0.1–1.0)
Monocytes Relative: 6 %
Neutro Abs: 9.6 10*3/uL — ABNORMAL HIGH (ref 1.7–7.7)
Neutrophils Relative %: 84 %
Platelets: 366 10*3/uL (ref 150–400)
RBC: 3.71 MIL/uL — ABNORMAL LOW (ref 3.87–5.11)
RDW: 18.2 % — ABNORMAL HIGH (ref 11.5–15.5)
WBC: 11.4 10*3/uL — ABNORMAL HIGH (ref 4.0–10.5)
nRBC: 0 % (ref 0.0–0.2)

## 2019-08-29 LAB — COMPREHENSIVE METABOLIC PANEL
ALT: 12 U/L (ref 0–44)
AST: 13 U/L — ABNORMAL LOW (ref 15–41)
Albumin: 3.5 g/dL (ref 3.5–5.0)
Alkaline Phosphatase: 45 U/L (ref 38–126)
Anion gap: 13 (ref 5–15)
BUN: 19 mg/dL (ref 6–20)
CO2: 25 mmol/L (ref 22–32)
Calcium: 9 mg/dL (ref 8.9–10.3)
Chloride: 98 mmol/L (ref 98–111)
Creatinine, Ser: 0.84 mg/dL (ref 0.44–1.00)
GFR calc Af Amer: >60 mL/min (ref >60)
GFR calc non Af Amer: >60 mL/min (ref >60)
Glucose, Bld: 196 mg/dL — ABNORMAL HIGH (ref 70–99)
Potassium: 4.3 mmol/L (ref 3.5–5.1)
Sodium: 136 mmol/L (ref 135–145)
Total Bilirubin: 0.5 mg/dL (ref 0.3–1.2)
Total Protein: 7.5 g/dL (ref 6.5–8.1)

## 2019-08-29 LAB — HIV ANTIBODY (ROUTINE TESTING W REFLEX): HIV Screen 4th Generation wRfx: NONREACTIVE — AB

## 2019-08-29 LAB — C-REACTIVE PROTEIN: CRP: 6.2 mg/dL — ABNORMAL HIGH (ref <1.0)

## 2019-08-29 LAB — GLUCOSE, CAPILLARY
Glucose-Capillary: 215 mg/dL — ABNORMAL HIGH (ref 70–99)
Glucose-Capillary: 280 mg/dL — ABNORMAL HIGH (ref 70–99)
Glucose-Capillary: 256 mg/dL — ABNORMAL HIGH (ref 70–99)
Glucose-Capillary: 227 mg/dL — ABNORMAL HIGH (ref 70–99)

## 2019-08-29 LAB — FERRITIN: Ferritin: 41 ng/mL (ref 11–307)

## 2019-08-29 LAB — MAGNESIUM: Magnesium: 2.4 mg/dL (ref 1.7–2.4)

## 2019-08-29 LAB — D-DIMER, QUANTITATIVE: D-Dimer, Quant: 0.31 ug/mL-FEU (ref 0.00–0.50)

## 2019-08-29 MED ORDER — INSULIN GLARGINE 100 UNIT/ML ~~LOC~~ SOLN
15.0000 [IU] | Freq: Every day | SUBCUTANEOUS | Status: DC
  Administered 2019-08-29 – 2019-08-30 (×2): 15 [IU] via SUBCUTANEOUS
  Filled 2019-08-29 (×4): qty 0.15

## 2019-08-29 NOTE — Progress Notes (Signed)
Inpatient Diabetes Program Recommendations  AACE/ADA: New Consensus Statement on Inpatient Glycemic Control (2015)  Target Ranges:  Prepandial:   less than 140 mg/dL      Peak postprandial:   less than 180 mg/dL (1-2 hours)      Critically ill patients:  140 - 180 mg/dL   Lab Results  Component Value Date   GLUCAP 215 (H) 08/29/2019   HGBA1C 7.4 (H) 08/28/2019    Review of Glycemic Control Results for Kristin Chavez, Kristin Chavez (MRN [id number]) as of 08/29/2019 09:01  Ref. Range 08/28/2019 07:49 08/28/2019 11:40 08/28/2019 15:41 08/28/2019 19:54 08/29/2019 07:16  Glucose-Capillary Latest Ref Range: 70 - 99 mg/dL 186 (H) 247 (H) 242 (H) 307 (H) 215 (H)   Diabetes history: DM 2 Outpatient Diabetes medications: None Current orders for Inpatient glycemic control:  Novolog 0-15 units tid  Decadron 6 mg Daily  A1c 7.4% however Hgb low.  Inpatient Diabetes Program Recommendations:    Glucose increases after meal intake  -  Consider adding Novolog HS scale  -  Consider adding Novolog 5 units tid meal coverage if eating >50%  Thanks,  Tama Headings RN, MSN, BC-ADM Inpatient Diabetes Coordinator Team Pager [phone] (8a-5p)

## 2019-08-29 NOTE — Progress Notes (Signed)
PROGRESS NOTE                                                                                                                                                                                                             Patient Demographics:    Kristin Chavez, is a 48 y.o. female, DOB - 10-20-1970, XBM:[id number]  Outpatient Primary MD for the patient is Rubye Beach   Admit date - 08/27/2019   LOS - 2  No chief complaint on file.      Brief Narrative: Patient is a 48 y.o. female with PMHx of Hashimoto's thyroiditis-noncompliant with levothyroxine, anemia secondary to chronic menorrhagia, obesity-diagnosed with COVID-19 a few days prior to this hospitalization-presented with shortness of breath-found to have acute hypoxic respiratory failure secondary to COVID-19 pneumonia.   Subjective:    Kristin Chavez was lying comfortably in bed-denies any chest pain or shortness of breath.  She was briefly placed on oxygen last night.   Assessment  & Plan :   Acute Hypoxic Resp Failure due to Covid 19 Viral pneumonia: Stable-slow clinical improvement continues-inflammatory markers is downtrending-continue steroids and remdesivir.  Fever: afebrile  O2 requirements:  Room air  COVID-19 Labs: Recent Labs    08/27/19 0041 08/27/19 0043 08/28/19 0335 08/29/19 0132  DDIMER  --   --  0.36 0.31  FERRITIN  --  20 35 41  LDH  --  168  --   --   CRP 12.9*  --  11.8* 6.2*    No results found for: SARSCOV2NAA   COVID-19 Medications: Steroids: 11/17>> Remdesivir: 11/17>> Actemra: Not indicated Convalescent Plasma:Not indicated  Other medications: Diuretics:Euvolemic-Lasix x1 to maintain negative balance Antibiotics:Not needed as no evidence of bacterial infection  Prone/Incentive Spirometry: encouraged incentive spirometry use 3-4/hour.  DVT Prophylaxis  :  Lovenox   Hypothyroidism: Continue levothyroxine at 50  mcg-needs repeat TSH in 3 months.  She has been noncompliant with levothyroxine prior to this hospital stay.  Follow with PCP.  Iron deficiency anemia with microcytosis: Has history of chronic menorrhagia-received IV iron x1 on 11/18-requires iron supplementation on discharge.  Have asked patient to follow with her primary GYN doctor for further work-up of her menorrhagia.  DM-2 (A1c 7.4 on 08/28/2019): CBGs remain elevated-start Lantus 15 units nightly-continue SSI-follow and optimize.    CBG (last 3)  Recent  Labs    08/28/19 1541 08/28/19 1954 08/29/19 0716  GLUCAP 242* 307* 215*    Obesity: Estimated body mass index is 43.2 kg/m as calculated from the following:   Height as of this encounter: 5\' 6"  (1.676 m).   Weight as of this encounter: 121.4 kg.   Consults  :  None  Procedures  :  None  ABG: No results found for: PHART, PCO2ART, PO2ART, HCO3, TCO2, ACIDBASEDEF, O2SAT  Vent Settings: N/A  Condition - Stable  Family Communication  : Patient will update family herself  Code Status :  Full Code  Diet :  Diet Order            Diet regular Room service appropriate? Yes; Fluid consistency: Thin  Diet effective now               Disposition Plan  :  Remain hospitalized  Barriers to discharge: Complete 5 days of IV Remdesivir  Antimicorbials  :    Anti-infectives (From admission, onward)   Start     Dose/Rate Route Frequency Ordered Stop   08/28/19 1000  remdesivir 100 mg in sodium chloride 0.9 % 250 mL IVPB     100 mg 500 mL/hr over 30 Minutes Intravenous Every 24 hours 08/27/19 1619 09/01/19 0959      Inpatient Medications  Scheduled Meds: . albuterol  2 puff Inhalation Q6H  . dexamethasone (DECADRON) injection  6 mg Intravenous Q12H  . dicyclomine  20 mg Oral TID AC  . enoxaparin (LOVENOX) injection  60 mg Subcutaneous Q24H  . insulin aspart  0-15 Units Subcutaneous TID WC  . levothyroxine  50 mcg Oral QAC breakfast  . pantoprazole  40 mg Oral  Daily  . vitamin C  500 mg Oral Daily  . zinc sulfate  220 mg Oral Daily   Continuous Infusions: . remdesivir 100 mg in NS 250 mL 100 mg (08/29/19 0855)   PRN Meds:.acetaminophen, alum & mag hydroxide-simeth, chlorpheniramine-HYDROcodone, guaiFENesin-dextromethorphan, ondansetron **OR** ondansetron (ZOFRAN) IV   Time Spent in minutes  25  See all Orders from today for further details   08/31/19 M.D on 08/29/2019 at 2:05 PM  To page go to [email or web address] - use universal password  [facility] -  Office  [phone]    Objective:   Vitals:   08/28/19 1542 08/28/19 2020 08/29/19 0408 08/29/19 0753  BP: (!) 153/81 133/62 (!) 155/94 (!) 145/82  Pulse: 82 76 72 71  Resp: 16 18 20 20   Temp: 99.2 F (37.3 C) 98 F (36.7 C) 98.4 F (36.9 C) 98.5 F (36.9 C)  TempSrc: Oral Oral Oral Oral  SpO2: 95% 96% 92% 93%  Weight:      Height:        Wt Readings from Last 3 Encounters:  08/27/19 121.4 kg  08/26/19 133.8 kg  12/04/18 131.5 kg     Intake/Output Summary (Last 24 hours) at 08/29/2019 1405 Last data filed at 08/29/2019 0715 Gross per 24 hour  Intake 2653.27 ml  Output 2 ml  Net 2651.27 ml     Physical Exam Gen Exam:Alert awake-not in any distress HEENT:atraumatic, normocephalic Chest: B/L clear to auscultation anteriorly CVS:S1S2 regular Abdomen:soft non tender, non distended Extremities:no edema Neurology: Non focal Skin: no rash   Data Review:    CBC Recent Labs  Lab 08/26/19 2046 08/28/19 0335 08/29/19 0132  WBC 5.2 7.6 11.4*  HGB 8.7* 8.9* 7.9*  HCT 29.6* 30.0* 27.2*  PLT 261 325 366  MCV 71.2*  73.7* 73.3*  MCH 20.9* 21.9* 21.3*  MCHC 29.4* 29.7* 29.0*  RDW 17.8* 18.1* 18.2*  LYMPHSABS 1.0 0.8 1.0  MONOABS 0.4 0.3 0.7  EOSABS 0.1 0.0 0.0  BASOSABS 0.0 0.0 0.0    Chemistries  Recent Labs  Lab 08/26/19 2046 08/28/19 0335 08/29/19 0132  NA 138 137 136  K 3.7 3.9 4.3  CL 102 102 98  CO2 25 23 25   GLUCOSE 146* 204*  196*  BUN 10 15 19   CREATININE 0.69 0.74 0.84  CALCIUM 8.7* 8.9 9.0  MG  --  2.3 2.4  AST 17 13* 13*  ALT 11 12 12   ALKPHOS 51 50 45  BILITOT 0.6 0.5 0.5   ------------------------------------------------------------------------------------------------------------------ No results for input(s): CHOL, HDL, LDLCALC, TRIG, CHOLHDL, LDLDIRECT in the last 72 hours.  Lab Results  Component Value Date   HGBA1C 7.4 (H) 08/28/2019   ------------------------------------------------------------------------------------------------------------------ Recent Labs    08/28/19 0335  TSH 7.174*   ------------------------------------------------------------------------------------------------------------------ Recent Labs    08/28/19 0335 08/29/19 0132  VITAMINB12 220  --   FOLATE 12.5  --   FERRITIN 35 41  TIBC 406  --   IRON 18*  --   RETICCTPCT 0.5  --     Coagulation profile Recent Labs  Lab 08/27/19 0043  INR 1.0    Recent Labs    08/28/19 0335 08/29/19 0132  DDIMER 0.36 0.31    Cardiac Enzymes No results for input(s): CKMB, TROPONINI, MYOGLOBIN in the last 168 hours.  Invalid input(s): CK ------------------------------------------------------------------------------------------------------------------ No results found for: BNP  Micro Results No results found for this or any previous visit (from the past 240 hour(s)).  Radiology Reports Dg Chest 2 View  Result Date: 08/26/2019 CLINICAL DATA:  Post cough, COVID-19 positive EXAM: CHEST - 2 VIEW COMPARISON:  Radiograph 12/04/2018 FINDINGS: Widespread multifocal interstitial and patchy airspace opacities throughout both lungs. No pneumothorax. No effusion. Cardiac size at the upper limits of normal. Cardiomediastinal contours otherwise unremarkable. No acute osseous or soft tissue abnormality. IMPRESSION: Widespread multifocal interstitial and patchy airspace opacities throughout both lungs, consistent with multifocal  pneumonia. Electronically Signed   By: Kreg ShropshirePrice  DeHay M.D.   On: 08/26/2019 21:38

## 2019-08-30 LAB — COMPREHENSIVE METABOLIC PANEL
ALT: 14 U/L (ref 0–44)
AST: 17 U/L (ref 15–41)
Albumin: 3.2 g/dL — ABNORMAL LOW (ref 3.5–5.0)
Alkaline Phosphatase: 48 U/L (ref 38–126)
Anion gap: 14 (ref 5–15)
BUN: 17 mg/dL (ref 6–20)
CO2: 24 mmol/L (ref 22–32)
Calcium: 8.8 mg/dL — ABNORMAL LOW (ref 8.9–10.3)
Chloride: 101 mmol/L (ref 98–111)
Creatinine, Ser: 0.66 mg/dL (ref 0.44–1.00)
GFR calc Af Amer: >60 mL/min (ref >60)
GFR calc non Af Amer: >60 mL/min (ref >60)
Glucose, Bld: 217 mg/dL — ABNORMAL HIGH (ref 70–99)
Potassium: 4.2 mmol/L (ref 3.5–5.1)
Sodium: 139 mmol/L (ref 135–145)
Total Bilirubin: 0.5 mg/dL (ref 0.3–1.2)
Total Protein: 7 g/dL (ref 6.5–8.1)

## 2019-08-30 LAB — CBC WITH DIFFERENTIAL/PLATELET
Abs Immature Granulocytes: 1.05 10*3/uL — ABNORMAL HIGH (ref 0.00–0.07)
Basophils Absolute: 0.1 10*3/uL (ref 0.0–0.1)
Basophils Relative: 0 %
Eosinophils Absolute: 0 10*3/uL (ref 0.0–0.5)
Eosinophils Relative: 0 %
HCT: 29.4 % — ABNORMAL LOW (ref 36.0–46.0)
Hemoglobin: 8.5 g/dL — ABNORMAL LOW (ref 12.0–15.0)
Immature Granulocytes: 7 %
Lymphocytes Relative: 10 %
Lymphs Abs: 1.5 10*3/uL (ref 0.7–4.0)
MCH: 21.2 pg — ABNORMAL LOW (ref 26.0–34.0)
MCHC: 28.9 g/dL — ABNORMAL LOW (ref 30.0–36.0)
MCV: 73.3 fL — ABNORMAL LOW (ref 80.0–100.0)
Monocytes Absolute: 0.8 10*3/uL (ref 0.1–1.0)
Monocytes Relative: 6 %
Neutro Abs: 11.8 10*3/uL — ABNORMAL HIGH (ref 1.7–7.7)
Neutrophils Relative %: 77 %
Platelets: 408 10*3/uL — ABNORMAL HIGH (ref 150–400)
RBC: 4.01 MIL/uL (ref 3.87–5.11)
RDW: 18 % — ABNORMAL HIGH (ref 11.5–15.5)
WBC: 15.3 10*3/uL — ABNORMAL HIGH (ref 4.0–10.5)
nRBC: 0.1 % (ref 0.0–0.2)

## 2019-08-30 LAB — C-REACTIVE PROTEIN: CRP: 3.8 mg/dL — ABNORMAL HIGH (ref <1.0)

## 2019-08-30 LAB — GLUCOSE, CAPILLARY
Glucose-Capillary: 223 mg/dL — ABNORMAL HIGH (ref 70–99)
Glucose-Capillary: 219 mg/dL — ABNORMAL HIGH (ref 70–99)
Glucose-Capillary: 269 mg/dL — ABNORMAL HIGH (ref 70–99)
Glucose-Capillary: 313 mg/dL — ABNORMAL HIGH (ref 70–99)

## 2019-08-30 LAB — MAGNESIUM: Magnesium: 2.3 mg/dL (ref 1.7–2.4)

## 2019-08-30 LAB — D-DIMER, QUANTITATIVE: D-Dimer, Quant: 0.38 ug/mL-FEU (ref 0.00–0.50)

## 2019-08-30 LAB — FERRITIN: Ferritin: 203 ng/mL (ref 11–307)

## 2019-08-30 MED ORDER — DEXAMETHASONE SODIUM PHOSPHATE 10 MG/ML IJ SOLN
6.0000 mg | INTRAMUSCULAR | Status: DC
  Administered 2019-08-31: 6 mg via INTRAVENOUS
  Filled 2019-08-30: qty 1

## 2019-08-30 MED ORDER — INSULIN ASPART 100 UNIT/ML ~~LOC~~ SOLN
4.0000 [IU] | Freq: Three times a day (TID) | SUBCUTANEOUS | Status: DC
  Administered 2019-08-30 – 2019-08-31 (×3): 4 [IU] via SUBCUTANEOUS

## 2019-08-30 NOTE — Progress Notes (Signed)
PROGRESS NOTE                                                                                                                                                                                                             Patient Demographics:    Kristin Chavez, is a 48 y.o. female, DOB - 10/16/1970, BLT:[id number]  Outpatient Primary MD for the patient is Reine Just   Admit date - 08/27/2019   LOS - 3  No chief complaint on file.      Brief Narrative: Patient is a 48 y.o. female with PMHx of Hashimoto's thyroiditis-noncompliant with levothyroxine, anemia secondary to chronic menorrhagia, obesity-diagnosed with COVID-19 a few days prior to this hospitalization-presented with shortness of breath-found to have acute hypoxic respiratory failure secondary to COVID-19 pneumonia.   Subjective:    [facility] room air this morning.  Feels better-denies any chest pain or shortness of breath  Assessment  & Plan :   Acute Hypoxic Resp Failure due to Covid 19 Viral pneumonia: Improving-remains on room air this morning.  Inflammatory markers downtrending.  Continue steroids/remdesivir.  Fever: afebrile  O2 requirements:  Room air  COVID-19 Labs: Recent Labs    08/28/19 0335 08/29/19 0132 08/30/19 0024  DDIMER 0.36 0.31 0.38  FERRITIN 35 41 203  CRP 11.8* 6.2* 3.8*    No results found for: SARSCOV2NAA   COVID-19 Medications: Steroids: 11/17>> Remdesivir: 11/17>> Actemra: Not indicated Convalescent Plasma:Not indicated  Other medications: Diuretics:Euvolemic-repeat Lasix x1 to maintain negative balance Antibiotics:Not needed as no evidence of bacterial infection  Prone/Incentive Spirometry: encouraged incentive spirometry use 3-4/hour.  DVT Prophylaxis  :  Lovenox   Hypothyroidism: Continue levothyroxine at 50 mcg-needs repeat TSH in 3 months.  She has been noncompliant with levothyroxine prior to  this hospital stay.  Follow with PCP.  Iron deficiency anemia with microcytosis: Has history of chronic menorrhagia-received IV iron x1 on 11/18-requires iron supplementation on discharge.  Have asked patient to follow with her primary GYN doctor for further work-up of her menorrhagia.  DM-2 (A1c 7.4 on 08/28/2019): CBGs remain elevated-continue Lantus 15 units daily-add 4 units of NovoLog with meals.    CBG (last 3)  Recent Labs    08/29/19 1525 08/29/19 2222 08/30/19 0737  GLUCAP 256* 227* 223*    Obesity: Estimated body mass index is 43.2 kg/m  as calculated from the following:   Height as of this encounter: 5\' 6"  (1.676 m).   Weight as of this encounter: 121.4 kg.   Consults  :  None  Procedures  :  None  ABG: No results found for: PHART, PCO2ART, PO2ART, HCO3, TCO2, ACIDBASEDEF, O2SAT  Vent Settings: N/A  Condition - Stable  Family Communication  : Patient will update family herself  Code Status :  Full Code  Diet :  Diet Order            Diet regular Room service appropriate? Yes; Fluid consistency: Thin  Diet effective now               Disposition Plan  :  Remain hospitalized  Barriers to discharge: Complete 5 days of IV Remdesivir  Antimicorbials  :    Anti-infectives (From admission, onward)   Start     Dose/Rate Route Frequency Ordered Stop   08/28/19 1000  remdesivir 100 mg in sodium chloride 0.9 % 250 mL IVPB     100 mg 500 mL/hr over 30 Minutes Intravenous Every 24 hours 08/27/19 1619 09/01/19 0959      Inpatient Medications  Scheduled Meds: . albuterol  2 puff Inhalation Q6H  . dexamethasone (DECADRON) injection  6 mg Intravenous Q12H  . dicyclomine  20 mg Oral TID AC  . enoxaparin (LOVENOX) injection  60 mg Subcutaneous Q24H  . insulin aspart  0-15 Units Subcutaneous TID WC  . insulin glargine  15 Units Subcutaneous QHS  . levothyroxine  50 mcg Oral QAC breakfast  . pantoprazole  40 mg Oral Daily  . vitamin C  500 mg Oral Daily  .  zinc sulfate  220 mg Oral Daily   Continuous Infusions: . remdesivir 100 mg in NS 250 mL 100 mg (08/30/19 1046)   PRN Meds:.acetaminophen, alum & mag hydroxide-simeth, chlorpheniramine-HYDROcodone, guaiFENesin-dextromethorphan, ondansetron **OR** ondansetron (ZOFRAN) IV   Time Spent in minutes  25  See all Orders from today for further details   Jeoffrey MassedShanker Edenilson Austad M.D on 08/30/2019 at 12:38 PM  To page go to [email or web address] - use universal password  [facility] -  Office  [phone]    Objective:   Vitals:   08/29/19 1527 08/29/19 2000 08/30/19 0433 08/30/19 0836  BP: 129/77 (!) 155/71 (!) 154/87 (!) 148/88  Pulse: 82 79 78 75  Resp: 17 16 15 18   Temp: 98.1 F (36.7 C) 98.9 F (37.2 C) 98.5 F (36.9 C)   TempSrc: Oral Oral Oral   SpO2: 94% 92% 98% 92%  Weight:      Height:        Wt Readings from Last 3 Encounters:  08/27/19 121.4 kg  08/26/19 133.8 kg  12/04/18 131.5 kg     Intake/Output Summary (Last 24 hours) at 08/30/2019 1238 Last data filed at 08/30/2019 0400 Gross per 24 hour  Intake 908 ml  Output -  Net 908 ml     Physical Exam Gen Exam:Alert awake-not in any distress HEENT:atraumatic, normocephalic Chest: B/L clear to auscultation anteriorly CVS:S1S2 regular Abdomen:soft non tender, non distended Extremities:no edema Neurology: Non focal Skin: no rash   Data Review:    CBC Recent Labs  Lab 08/26/19 2046 08/28/19 0335 08/29/19 0132 08/30/19 0024  WBC 5.2 7.6 11.4* 15.3*  HGB 8.7* 8.9* 7.9* 8.5*  HCT 29.6* 30.0* 27.2* 29.4*  PLT 261 325 366 408*  MCV 71.2* 73.7* 73.3* 73.3*  MCH 20.9* 21.9* 21.3* 21.2*  MCHC 29.4* 29.7* 29.0* 28.9*  RDW 17.8* 18.1* 18.2* 18.0*  LYMPHSABS 1.0 0.8 1.0 1.5  MONOABS 0.4 0.3 0.7 0.8  EOSABS 0.1 0.0 0.0 0.0  BASOSABS 0.0 0.0 0.0 0.1    Chemistries  Recent Labs  Lab 08/26/19 2046 08/28/19 0335 08/29/19 0132 08/30/19 0024  NA 138 137 136 139  K 3.7 3.9 4.3 4.2  CL 102 102 98 101  CO2  25 23 25 24   GLUCOSE 146* 204* 196* 217*  BUN 10 15 19 17   CREATININE 0.69 0.74 0.84 0.66  CALCIUM 8.7* 8.9 9.0 8.8*  MG  --  2.3 2.4 2.3  AST 17 13* 13* 17  ALT 11 12 12 14   ALKPHOS 51 50 45 48  BILITOT 0.6 0.5 0.5 0.5   ------------------------------------------------------------------------------------------------------------------ No results for input(s): CHOL, HDL, LDLCALC, TRIG, CHOLHDL, LDLDIRECT in the last 72 hours.  Lab Results  Component Value Date   HGBA1C 7.4 (H) 08/28/2019   ------------------------------------------------------------------------------------------------------------------ Recent Labs    08/28/19 0335  TSH 7.174*   ------------------------------------------------------------------------------------------------------------------ Recent Labs    08/28/19 0335 08/29/19 0132 08/30/19 0024  VITAMINB12 220  --   --   FOLATE 12.5  --   --   FERRITIN 35 41 203  TIBC 406  --   --   IRON 18*  --   --   RETICCTPCT 0.5  --   --     Coagulation profile Recent Labs  Lab 08/27/19 0043  INR 1.0    Recent Labs    08/29/19 0132 08/30/19 0024  DDIMER 0.31 0.38    Cardiac Enzymes No results for input(s): CKMB, TROPONINI, MYOGLOBIN in the last 168 hours.  Invalid input(s): CK ------------------------------------------------------------------------------------------------------------------ No results found for: BNP  Micro Results No results found for this or any previous visit (from the past 240 hour(s)).  Radiology Reports Dg Chest 2 View  Result Date: 08/26/2019 CLINICAL DATA:  Post cough, COVID-19 positive EXAM: CHEST - 2 VIEW COMPARISON:  Radiograph 12/04/2018 FINDINGS: Widespread multifocal interstitial and patchy airspace opacities throughout both lungs. No pneumothorax. No effusion. Cardiac size at the upper limits of normal. Cardiomediastinal contours otherwise unremarkable. No acute osseous or soft tissue abnormality. IMPRESSION:  Widespread multifocal interstitial and patchy airspace opacities throughout both lungs, consistent with multifocal pneumonia. Electronically Signed   By: Lovena Le M.D.   On: 08/26/2019 21:38

## 2019-08-30 NOTE — Progress Notes (Signed)
Results for SHAUNA, BODKINS (MRN [id number]) as of 08/30/2019 11:31  Ref. Range 08/29/2019 07:16 08/29/2019 11:12 08/29/2019 15:25 08/29/2019 22:22 08/30/2019 07:37  Glucose-Capillary Latest Ref Range: 70 - 99 mg/dL 215 (H) 280 (H) 256 (H) 227 (H) 223 (H)  Noted that blood sugars continue to be greater than 200 mg/dl.   Recommend adding Novolog 5 units TID as meal coverage if patient eats at least 50% of meals and postprandial blood sugars continue to be elevated.   Harvel Ricks RN BSN CDE Diabetes Coordinator Pager: [phone]  8am-5pm

## 2019-08-30 NOTE — Telephone Encounter (Signed)
Please see mychart message that patient sent.

## 2019-08-31 LAB — COMPREHENSIVE METABOLIC PANEL
ALT: 15 U/L (ref 0–44)
AST: 16 U/L (ref 15–41)
Albumin: 3.2 g/dL — ABNORMAL LOW (ref 3.5–5.0)
Alkaline Phosphatase: 46 U/L (ref 38–126)
Anion gap: 11 (ref 5–15)
BUN: 22 mg/dL — ABNORMAL HIGH (ref 6–20)
CO2: 25 mmol/L (ref 22–32)
Calcium: 8.8 mg/dL — ABNORMAL LOW (ref 8.9–10.3)
Chloride: 101 mmol/L (ref 98–111)
Creatinine, Ser: 0.67 mg/dL (ref 0.44–1.00)
GFR calc Af Amer: >60 mL/min (ref >60)
GFR calc non Af Amer: >60 mL/min (ref >60)
Glucose, Bld: 202 mg/dL — ABNORMAL HIGH (ref 70–99)
Potassium: 4.3 mmol/L (ref 3.5–5.1)
Sodium: 137 mmol/L (ref 135–145)
Total Bilirubin: 0.5 mg/dL (ref 0.3–1.2)
Total Protein: 6.8 g/dL (ref 6.5–8.1)

## 2019-08-31 LAB — C-REACTIVE PROTEIN: CRP: 2.7 mg/dL — ABNORMAL HIGH (ref <1.0)

## 2019-08-31 LAB — CBC WITH DIFFERENTIAL/PLATELET
Abs Immature Granulocytes: 1.39 10*3/uL — ABNORMAL HIGH (ref 0.00–0.07)
Basophils Absolute: 0.1 10*3/uL (ref 0.0–0.1)
Basophils Relative: 1 %
Eosinophils Absolute: 0 10*3/uL (ref 0.0–0.5)
Eosinophils Relative: 0 %
HCT: 29.7 % — ABNORMAL LOW (ref 36.0–46.0)
Hemoglobin: 8.7 g/dL — ABNORMAL LOW (ref 12.0–15.0)
Immature Granulocytes: 9 %
Lymphocytes Relative: 15 %
Lymphs Abs: 2.1 10*3/uL (ref 0.7–4.0)
MCH: 21.4 pg — ABNORMAL LOW (ref 26.0–34.0)
MCHC: 29.3 g/dL — ABNORMAL LOW (ref 30.0–36.0)
MCV: 73 fL — ABNORMAL LOW (ref 80.0–100.0)
Monocytes Absolute: 1 10*3/uL (ref 0.1–1.0)
Monocytes Relative: 7 %
Neutro Abs: 10.1 10*3/uL — ABNORMAL HIGH (ref 1.7–7.7)
Neutrophils Relative %: 68 %
Platelets: 448 10*3/uL — ABNORMAL HIGH (ref 150–400)
RBC: 4.07 MIL/uL (ref 3.87–5.11)
RDW: 18.1 % — ABNORMAL HIGH (ref 11.5–15.5)
WBC: 14.7 10*3/uL — ABNORMAL HIGH (ref 4.0–10.5)
nRBC: 0.6 % — ABNORMAL HIGH (ref 0.0–0.2)

## 2019-08-31 LAB — MAGNESIUM: Magnesium: 2.5 mg/dL — ABNORMAL HIGH (ref 1.7–2.4)

## 2019-08-31 LAB — D-DIMER, QUANTITATIVE: D-Dimer, Quant: 0.35 ug/mL-FEU (ref 0.00–0.50)

## 2019-08-31 LAB — GLUCOSE, CAPILLARY
Glucose-Capillary: 162 mg/dL — ABNORMAL HIGH (ref 70–99)
Glucose-Capillary: 260 mg/dL — ABNORMAL HIGH (ref 70–99)

## 2019-08-31 LAB — FERRITIN: Ferritin: 377 ng/mL — ABNORMAL HIGH (ref 11–307)

## 2019-08-31 MED ORDER — LEVOTHYROXINE SODIUM 50 MCG PO TABS: 50.0000 ug | ORAL_TABLET | Freq: Every day | ORAL | 0 refills | Status: DC

## 2019-08-31 MED ORDER — METFORMIN HCL 500 MG PO TABS: 500.0000 mg | ORAL_TABLET | Freq: Two times a day (BID) | ORAL | 0 refills | Status: DC

## 2019-08-31 MED ORDER — BLOOD GLUCOSE METER KIT: PACK | 0 refills | Status: DC

## 2019-08-31 MED ORDER — INSULIN PEN NEEDLE 32G X 8 MM MISC: 0 refills | Status: DC

## 2019-08-31 MED ORDER — FERROUS SULFATE 325 (65 FE) MG PO TABS: 325.0000 mg | ORAL_TABLET | Freq: Two times a day (BID) | ORAL | 0 refills | Status: DC

## 2019-08-31 MED ORDER — DEXAMETHASONE 6 MG PO TABS: 6.0000 mg | ORAL_TABLET | Freq: Every day | ORAL | 0 refills | Status: DC

## 2019-08-31 MED ORDER — NOVOLOG FLEXPEN 100 UNIT/ML ~~LOC~~ SOPN: PEN_INJECTOR | SUBCUTANEOUS | 0 refills | Status: DC

## 2019-08-31 NOTE — Discharge Instructions (Signed)
Insulin Injection Instructions, Using Insulin Pens, Adult A subcutaneous injection is a shot of medicine that is injected into the layer of fat and tissue between skin and muscle. People with type 1 diabetes must take insulin because their bodies do not make it. People with type 2 diabetes may need to take insulin. There are many different types of insulin. The type of insulin that you take may determine how many injections you give yourself and when you need to give the injections. Supplies needed: Soap and water to wash hands. Your insulin pen. A new, unused needle. Alcohol wipes. A disposal container that is meant for sharp items (sharps container), such as an empty plastic bottle with a cover. How to choose a site for injection The body absorbs insulin differently, depending on where the insulin is injected (injection site). It is best to inject insulin into the same body area each time (for example, always in the abdomen), but you should use a different spot in that area for each injection. Do not inject the insulin in the same spot each time. There are five main areas that can be used for injecting. These areas include: Abdomen. This is the preferred area. Front of thigh. Upper, outer side of thigh. Upper, outer side of arm. Upper, outer part of buttock. How to use an insulin pen  First, follow the steps for Get ready, then continue with the steps for Inject the insulin. Get ready Wash your hands with soap and water. If soap and water are not available, use hand sanitizer. Before you give yourself an insulin injection, be sure to test your blood sugar level (blood glucose level) and write down that number. Follow any instructions from your health care provider about what to do if your blood glucose level is higher or lower than your normal range. Check the expiration date and the type of insulin that is in the pen. If you are using CLEAR insulin, check to see that it is clear and free of  clumps. If you are using CLOUDY insulin, do not shake the pen to get the injection ready. Instead, get it ready in one of these ways: Gently roll the pen between your palms several times. Tip the pen up and down several times. Remove the cap from the insulin pen. Use an alcohol wipe to clean the rubber tip of the pen. Remove the protective paper tab from the disposable needle. Do not let the needle touch anything. Screw a new, unused needle onto the pen. Remove the outer plastic needle cover. Do not throw away the outer plastic cover yet. If the pen uses a special safety needle, leave the inner needle shield in place. If the pen does not use a special safety needle, remove the inner plastic cover from the needle. Follow the manufacturer's instructions to prime the insulin pen with the volume of insulin needed. Hold the pen with the needle pointing up, and push the button on the opposite end of the pen until a drop of insulin appears at the needle tip. If no insulin appears, repeat this step. Turn the button (dial) to the number of units of insulin that you will be injecting. Inject the insulin Use an alcohol wipe to clean the site where you will be injecting the needle. Let the site air-dry. Hold the pen in the palm of your writing hand like a pencil. If directed by your health care provider, use your other hand to pinch and hold about an inch (2.5 cm)  of skin at the injection site. Do not directly touch the cleaned part of the skin. Gently but quickly, use your writing hand to put the needle straight into the skin. The needle should be at a 90-degree angle (perpendicular) to the skin. When the needle is completely inserted into the skin, use your thumb or index finger of your writing hand to push the top button of the pen down all the way to inject the insulin. Let go of the skin that you are pinching. Continue to hold the pen in place with your writing hand. Wait 10 seconds, then pull the  needle straight out of the skin. This will allow all of the insulin to go from the pen and needle into your body. Carefully put the larger (outer) plastic cover of the needle back over the needle, then unscrew the capped needle and discard it in a sharps container, such as an empty plastic bottle with a cover. Put the plastic cap back on the insulin pen. How to throw away supplies Discard all used needles in a puncture-proof sharps disposal container. You can ask your local pharmacy about where you can get this kind of disposal container, or you can use an empty plastic liquid laundry detergent bottle that has a cover. Follow the disposal regulations for the area where you live. Do not use any needle more than one time. Throw away empty disposable pens in the regular trash. Questions to ask your health care provider How often should I be taking insulin? How often should I check my blood glucose? What amount of insulin should I be taking at each time? What are the side effects? What should I do if my blood glucose is too high? What should I do if my blood glucose is too low? What should I do if I forget to take my insulin? What number should I call if I have questions? Where to find more information American Diabetes Association (ADA): www.diabetes.org American Association of Diabetes Educators (AADE) Patient Resources: https://www.diabeteseducator.org Summary A subcutaneous injection is a shot of medicine that is injected into the layer of fat and tissue between skin and muscle. Before you give yourself an insulin injection, be sure to test your blood sugar level (blood glucose level) and write down that number. Check the expiration date and the type of insulin that is in the pen. The type of insulin that you take may determine how many injections you give yourself and when you need to give the injections. It is best to inject insulin into the same body area each time (for example, always in  the abdomen), but you should use a different spot in that area for each injection. This information is not intended to replace advice given to you by your health care provider. Make sure you discuss any questions you have with your health care provider. Document Released: 10/30/2015 Document Revised: 10/16/2017 Document Reviewed: 10/30/2015 Elsevier Patient Education  2020 Elsevier Inc.    Person Under Monitoring Name: Kristin Chavez  Location: [address] [place] [address]   Infection Prevention Recommendations for Individuals Confirmed to have, or Being Evaluated for, 2019 Novel Coronavirus (COVID-19) Infection Who Receive Care at Home  Individuals who are confirmed to have, or are being evaluated for, COVID-19 should follow the prevention steps below until a healthcare provider or local or state health department says they can return to normal activities.  Stay home except to get medical care You should restrict activities outside your home,  except for getting medical care. Do not go to work, school, or public areas, and do not use public transportation or taxis.  Call ahead before visiting your doctor Before your medical appointment, call the healthcare provider and tell them that you have, or are being evaluated for, COVID-19 infection. This will help the healthcare providers office take steps to keep other people from getting infected. Ask your healthcare provider to call the local or state health department.  Monitor your symptoms Seek prompt medical attention if your illness is worsening (e.g., difficulty breathing). Before going to your medical appointment, call the healthcare provider and tell them that you have, or are being evaluated for, COVID-19 infection. Ask your healthcare provider to call the local or state health department.  Wear a facemask You should wear a facemask that covers your nose and mouth when you are in the same room with other people  and when you visit a healthcare provider. People who live with or visit you should also wear a facemask while they are in the same room with you.  Separate yourself from other people in your home As much as possible, you should stay in a different room from other people in your home. Also, you should use a separate bathroom, if available.  Avoid sharing household items You should not share dishes, drinking glasses, cups, eating utensils, towels, bedding, or other items with other people in your home. After using these items, you should wash them thoroughly with soap and water.  Cover your coughs and sneezes Cover your mouth and nose with a tissue when you cough or sneeze, or you can cough or sneeze into your sleeve. Throw used tissues in a lined trash can, and immediately wash your hands with soap and water for at least 20 seconds or use an alcohol-based hand rub.  Wash your [facility] your hands often and thoroughly with soap and water for at least 20 seconds. You can use an alcohol-based hand sanitizer if soap and water are not available and if your hands are not visibly dirty. Avoid touching your eyes, nose, and mouth with unwashed hands.   Prevention Steps for Caregivers and Household Members of Individuals Confirmed to have, or Being Evaluated for, COVID-19 Infection Being Cared for in the Home  If you live with, or provide care at home for, a person confirmed to have, or being evaluated for, COVID-19 infection please follow these guidelines to prevent infection:  Follow healthcare providers instructions Make sure that you understand and can help the patient follow any healthcare provider instructions for all care.  Provide for the patients basic needs You should help the patient with basic needs in the home and provide support for getting groceries, prescriptions, and other personal needs.  Monitor the patients symptoms If they are getting sicker, call his or her medical  provider and tell them that the patient has, or is being evaluated for, COVID-19 infection. This will help the healthcare providers office take steps to keep other people from getting infected. Ask the healthcare provider to call the local or state health department.  Limit the number of people who have contact with the patient  If possible, have only one caregiver for the patient.  Other household members should stay in another home or place of residence. If this is not possible, they should stay  in another room, or be separated from the patient as much as possible. Use a separate bathroom, if available.  Restrict visitors who do not have  an essential need to be in the home.  Keep older adults, very young children, and other sick people away from the patient Keep older adults, very young children, and those who have compromised immune systems or chronic health conditions away from the patient. This includes people with chronic heart, lung, or kidney conditions, diabetes, and cancer.  Ensure good ventilation Make sure that shared spaces in the home have good air flow, such as from an air conditioner or an opened window, weather permitting.  Wash your hands often  Wash your hands often and thoroughly with soap and water for at least 20 seconds. You can use an alcohol based hand sanitizer if soap and water are not available and if your hands are not visibly dirty.  Avoid touching your eyes, nose, and mouth with unwashed hands.  Use disposable paper towels to dry your hands. If not available, use dedicated cloth towels and replace them when they become wet.  Wear a facemask and gloves  Wear a disposable facemask at all times in the room and gloves when you touch or have contact with the patients blood, body fluids, and/or secretions or excretions, such as sweat, saliva, sputum, nasal mucus, vomit, urine, or feces.  Ensure the mask fits over your nose and mouth tightly, and do not touch  it during use.  Throw out disposable facemasks and gloves after using them. Do not reuse.  Wash your hands immediately after removing your facemask and gloves.  If your personal clothing becomes contaminated, carefully remove clothing and launder. Wash your hands after handling contaminated clothing.  Place all used disposable facemasks, gloves, and other waste in a lined container before disposing them with other household waste.  Remove gloves and wash your hands immediately after handling these items.  Do not share dishes, glasses, or other household items with the patient  Avoid sharing household items. You should not share dishes, drinking glasses, cups, eating utensils, towels, bedding, or other items with a patient who is confirmed to have, or being evaluated for, COVID-19 infection.  After the person uses these items, you should wash them thoroughly with soap and water.  Wash laundry thoroughly  Immediately remove and wash clothes or bedding that have blood, body fluids, and/or secretions or excretions, such as sweat, saliva, sputum, nasal mucus, vomit, urine, or feces, on them.  Wear gloves when handling laundry from the patient.  Read and follow directions on labels of laundry or clothing items and detergent. In general, wash and dry with the warmest temperatures recommended on the label.  Clean all areas the individual has used often  Clean all touchable surfaces, such as counters, tabletops, doorknobs, bathroom fixtures, toilets, phones, keyboards, tablets, and bedside tables, every day. Also, clean any surfaces that may have blood, body fluids, and/or secretions or excretions on them.  Wear gloves when cleaning surfaces the patient has come in contact with.  Use a diluted bleach solution (e.g., dilute bleach with 1 part bleach and 10 parts water) or a household disinfectant with a label that says EPA-registered for coronaviruses. To make a bleach solution at home, add 1  tablespoon of bleach to 1 quart (4 cups) of water. For a larger supply, add  cup of bleach to 1 gallon (16 cups) of water.  Read labels of cleaning products and follow recommendations provided on product labels. Labels contain instructions for safe and effective use of the cleaning product including precautions you should take when applying the product, such as wearing gloves or  eye protection and making sure you have good ventilation during use of the product.  Remove gloves and wash hands immediately after cleaning.  Monitor yourself for signs and symptoms of illness Caregivers and household members are considered close contacts, should monitor their health, and will be asked to limit movement outside of the home to the extent possible. Follow the monitoring steps for close contacts listed on the symptom monitoring form.   ? If you have additional questions, contact your local health department or call the epidemiologist on call at [phone] (available 24/7). ? This guidance is subject to change. For the most up-to-date guidance from [facility], please refer to their website: [email or web address]

## 2019-08-31 NOTE — Progress Notes (Deleted)
PATIENT DETAILS Name: Kristin Chavez Age: 48 y.o. Sex: female Date of Birth: 03-Jun-1971 MRN: [id number]. Admitting Physician: Phillips Grout, MD [id number], Clearnce Sorrel, PA-C  Admit Date: 08/27/2019 Discharge date: 08/31/2019  Recommendations for Outpatient Follow-up:  1. Follow up with PCP in 1-2 weeks 2. Please obtain CMP/CBC in one week 3. Repeat Chest Xray in 4-6 week 4. Repeat TSH/A1c in 3 months 5. Consider referral to GYN for evaluation of menorrhagia 6. Recheck iron panel in 3 to 6 months.  Admitted From:  Home  Disposition: [place]: No  Equipment/Devices: None  Discharge Condition: Stable  CODE STATUS: FULL CODE  Diet recommendation:  Diet Order            Diet - low sodium heart healthy        Diet Carb Modified        Diet regular Room service appropriate? Yes; Fluid consistency: Thin  Diet effective now               Brief Summary: See H&P, Labs, Consult and Test reports for all details in brief, Patient is a 48 y.o. female with PMHx of Hashimoto's thyroiditis-noncompliant with levothyroxine, anemia secondary to chronic menorrhagia, obesity-diagnosed with COVID-19 a few days prior to this hospitalization-presented with shortness of breath-found to have acute hypoxic respiratory failure secondary to COVID-19 pneumonia.  Brief Hospital Course: Acute Hypoxic Resp Failure due to Covid 19 Viral pneumonia:  Much improved-inflammatory markers continue to downtrend-she has been on room air for the past few days.  Will complete last dose of remdesivir on 11/21 following which she will be discharged home.  She will be continued on tapering steroids on discharge.   COVID-19 Labs:  Recent Labs    08/29/19 0132 08/30/19 0024 08/31/19 0110  DDIMER 0.31 0.38 0.35  FERRITIN 41 203 377*  CRP 6.2* 3.8* 2.7*    No results found for: SARSCOV2NAA   COVID-19 Medications: Steroids: 11/17>> Remdesivir: 11/17>>11/21 Actemra: Not  indicated Convalescent Plasma:Not indicated  Hypothyroidism (TSH 7.17 on 11/18):  Noncompliant with levothyroxine in the past 3 months-we will drop down levothyroxine to 50 mcg daily dosing on discharge (previously on 100 mcg).  Please repeat TSH in 3 months.  Follow with PCP.  Iron deficiency anemia with microcytosis: Has history of chronic menorrhagia-received IV iron x1 on 11/18-requires iron supplementation on discharge.  Have asked patient to follow with her primary GYN doctor for further work-up of her menorrhagia.  DM-2 (A1c 7.4 on 08/28/2019): CBGs remain elevated as patient on steroids-managed with Lantus and SSI-we will continue with SSI on discharge while she is on steroids-given her elevated A1c-I will also place her on metformin on discharge.  Patient to follow with PCP for further optimization.  CBG (last 3)  Recent Labs    08/30/19 1628 08/30/19 1914 08/31/19 0756  GLUCAP 269* 313* 162*   Obesity: Estimated body mass index is 43.2 kg/m as calculated from the following:   Height as of this encounter: _0  (1.676 m).   Weight as of this encounter: 121.4 kg.   Procedures/Studies: None  Discharge Diagnoses:  Principal Problem:   Pneumonia due to COVID-19 virus Active Problems:   Adult hypothyroidism   Obesity, Class III, BMI 40-49.9 (morbid obesity) (HCC)   Iron deficiency anemia secondary to inadequate dietary iron intake   Acute respiratory failure with hypoxia [facility])   Discharge Instructions:    Person Under Monitoring Name: Kristin Chavez  Location: [address] [place]  [place] [address]   Infection Prevention Recommendations for Individuals Confirmed to have, or Being Evaluated for, 2019 Novel Coronavirus (COVID-19) Infection Who Receive Care at Home  Individuals who are confirmed to have, or are being evaluated for, COVID-19 should follow the prevention steps below until a healthcare provider or local or state health department says they can return  to normal activities.  Stay home except to get medical care You should restrict activities outside your home, except for getting medical care. Do not go to work, school, or public areas, and do not use public transportation or taxis.  Call ahead before visiting your doctor Before your medical appointment, call the healthcare provider and tell them that you have, or are being evaluated for, COVID-19 infection. This will help the healthcare providers office take steps to keep other people from getting infected. Ask your healthcare provider to call the local or state health department.  Monitor your symptoms Seek prompt medical attention if your illness is worsening (e.g., difficulty breathing). Before going to your medical appointment, call the healthcare provider and tell them that you have, or are being evaluated for, COVID-19 infection. Ask your healthcare provider to call the local or state health department.  Wear a facemask You should wear a facemask that covers your nose and mouth when you are in the same room with other people and when you visit a healthcare provider. People who live with or visit you should also wear a facemask while they are in the same room with you.  Separate yourself from other people in your home As much as possible, you should stay in a different room from other people in your home. Also, you should use a separate bathroom, if available.  Avoid sharing household items You should not share dishes, drinking glasses, cups, eating utensils, towels, bedding, or other items with other people in your home. After using these items, you should wash them thoroughly with soap and water.  Cover your coughs and sneezes Cover your mouth and nose with a tissue when you cough or sneeze, or you can cough or sneeze into your sleeve. Throw used tissues in a lined trash can, and immediately wash your hands with soap and water for at least 20 seconds or use an alcohol-based  hand rub.  Wash your [facility] your hands often and thoroughly with soap and water for at least 20 seconds. You can use an alcohol-based hand sanitizer if soap and water are not available and if your hands are not visibly dirty. Avoid touching your eyes, nose, and mouth with unwashed hands.   Prevention Steps for Caregivers and Household Members of Individuals Confirmed to have, or Being Evaluated for, COVID-19 Infection Being Cared for in the Home  If you live with, or provide care at home for, a person confirmed to have, or being evaluated for, COVID-19 infection please follow these guidelines to prevent infection:  Follow healthcare providers instructions Make sure that you understand and can help the patient follow any healthcare provider instructions for all care.  Provide for the patients basic needs You should help the patient with basic needs in the home and provide support for getting groceries, prescriptions, and other personal needs.  Monitor the patients symptoms If they are getting sicker, call his or her medical provider and tell them that the patient has, or is being evaluated for, COVID-19 infection. This will help the healthcare providers office take steps to keep other people from getting infected. Ask the healthcare provider  to call the local or state health department.  Limit the number of people who have contact with the patient  If possible, have only one caregiver for the patient.  Other household members should stay in another home or place of residence. If this is not possible, they should stay  in another room, or be separated from the patient as much as possible. Use a separate bathroom, if available.  Restrict visitors who do not have an essential need to be in the home.  Keep older adults, very young children, and other sick people away from the patient Keep older adults, very young children, and those who have compromised immune systems or  chronic health conditions away from the patient. This includes people with chronic heart, lung, or kidney conditions, diabetes, and cancer.  Ensure good ventilation Make sure that shared spaces in the home have good air flow, such as from an air conditioner or an opened window, weather permitting.  Wash your hands often  Wash your hands often and thoroughly with soap and water for at least 20 seconds. You can use an alcohol based hand sanitizer if soap and water are not available and if your hands are not visibly dirty.  Avoid touching your eyes, nose, and mouth with unwashed hands.  Use disposable paper towels to dry your hands. If not available, use dedicated cloth towels and replace them when they become wet.  Wear a facemask and gloves  Wear a disposable facemask at all times in the room and gloves when you touch or have contact with the patients blood, body fluids, and/or secretions or excretions, such as sweat, saliva, sputum, nasal mucus, vomit, urine, or feces.  Ensure the mask fits over your nose and mouth tightly, and do not touch it during use.  Throw out disposable facemasks and gloves after using them. Do not reuse.  Wash your hands immediately after removing your facemask and gloves.  If your personal clothing becomes contaminated, carefully remove clothing and launder. Wash your hands after handling contaminated clothing.  Place all used disposable facemasks, gloves, and other waste in a lined container before disposing them with other household waste.  Remove gloves and wash your hands immediately after handling these items.  Do not share dishes, glasses, or other household items with the patient  Avoid sharing household items. You should not share dishes, drinking glasses, cups, eating utensils, towels, bedding, or other items with a patient who is confirmed to have, or being evaluated for, COVID-19 infection.  After the person uses these items, you should wash them  thoroughly with soap and water.  Wash laundry thoroughly  Immediately remove and wash clothes or bedding that have blood, body fluids, and/or secretions or excretions, such as sweat, saliva, sputum, nasal mucus, vomit, urine, or feces, on them.  Wear gloves when handling laundry from the patient.  Read and follow directions on labels of laundry or clothing items and detergent. In general, wash and dry with the warmest temperatures recommended on the label.  Clean all areas the individual has used often  Clean all touchable surfaces, such as counters, tabletops, doorknobs, bathroom fixtures, toilets, phones, keyboards, tablets, and bedside tables, every day. Also, clean any surfaces that may have blood, body fluids, and/or secretions or excretions on them.  Wear gloves when cleaning surfaces the patient has come in contact with.  Use a diluted bleach solution (e.g., dilute bleach with 1 part bleach and 10 parts water) or a household disinfectant with a label that  says EPA-registered for coronaviruses. To make a bleach solution at home, add 1 tablespoon of bleach to 1 quart (4 cups) of water. For a larger supply, add  cup of bleach to 1 gallon (16 cups) of water.  Read labels of cleaning products and follow recommendations provided on product labels. Labels contain instructions for safe and effective use of the cleaning product including precautions you should take when applying the product, such as wearing gloves or eye protection and making sure you have good ventilation during use of the product.  Remove gloves and wash hands immediately after cleaning.  Monitor yourself for signs and symptoms of illness Caregivers and household members are considered close contacts, should monitor their health, and will be asked to limit movement outside of the home to the extent possible. Follow the monitoring steps for close contacts listed on the symptom monitoring form.   ? If you have additional  questions, contact your local health department or call the epidemiologist on call at [phone] (available 24/7). ? This guidance is subject to change. For the most up-to-date guidance from CDC, please refer to their website: YouBlogs.pl    Activity:  As tolerated  Discharge Instructions    Call MD for:  difficulty breathing, headache or visual disturbances   Complete by: As directed    Call MD for:  persistant dizziness or light-headedness   Complete by: As directed    Diet - low sodium heart healthy   Complete by: As directed    Diet Carb Modified   Complete by: As directed    Discharge instructions   Complete by: As directed    Follow with Primary MD  Mar Daring, PA-C in 1-2 weeks  Please ask your primary care practitioner to check a TSH, A1c in the next 3 months  Please get a complete blood count and chemistry panel checked by your Primary MD at your next visit, and again as instructed by your Primary MD.  Get Medicines reviewed and adjusted: Please take all your medications with you for your next visit with your Primary MD  Laboratory/radiological data: Please request your Primary MD to go over all hospital tests and procedure/radiological results at the follow up, please ask your Primary MD to get all Hospital records sent to his/her office.  In some cases, they will be blood work, cultures and biopsy results pending at the time of your discharge. Please request that your primary care M.D. follows up on these results.  Also Note the following: If you experience worsening of your admission symptoms, develop shortness of breath, life threatening emergency, suicidal or homicidal thoughts you must seek medical attention immediately by calling 911 or calling your MD immediately  if symptoms less severe.  You must read complete instructions/literature along with all the possible adverse reactions/side  effects for all the Medicines you take and that have been prescribed to you. Take any new Medicines after you have completely understood and accpet all the possible adverse reactions/side effects.   Do not drive when taking Pain medications or sleeping medications (Benzodaizepines)  Do not take more than prescribed Pain, Sleep and Anxiety Medications. It is not advisable to combine anxiety,sleep and pain medications without talking with your primary care practitioner  Special Instructions: If you have smoked or chewed Tobacco  in the last 2 yrs please stop smoking, stop any regular Alcohol  and or any Recreational drug use.  Wear Seat belts while driving.  Please note: You were cared for by a  hospitalist during your hospital stay. Once you are discharged, your primary care physician will handle any further medical issues. Please note that NO REFILLS for any discharge medications will be authorized once you are discharged, as it is imperative that you return to your primary care physician (or establish a relationship with a primary care physician if you do not have one) for your post hospital discharge needs so that they can reassess your need for medications and monitor your lab values.   3 weeks of quarantine/isolation from 08/21/2019 (day of Covid testing)   Increase activity slowly   Complete by: As directed    MyChart COVID-19 home monitoring program   Complete by: Aug 31, 2019    Is the patient willing to use the [place] for home monitoring?: Yes   Temperature monitoring   Complete by: Aug 31, 2019    After how many days would you like to receive a notification of this patient's flowsheet entries?: 1     Allergies as of 08/31/2019   No Known Allergies     Medication List    TAKE these medications   acetaminophen 500 MG tablet Commonly known as: TYLENOL Take 500 mg by mouth every 6 (six) hours as needed for mild pain or fever.   blood glucose meter kit and  supplies Dispense based on patient and insurance preference. Use up to four times daily as directed. (FOR ICD-10 E10.9, E11.9).   dexamethasone 6 MG tablet Commonly known as: DECADRON Take 1 tablet (6 mg total) by mouth daily.   dicyclomine 20 MG tablet Commonly known as: BENTYL Take 20 mg by mouth 3 (three) times daily as needed (for cramps).   ferrous sulfate 325 (65 FE) MG tablet Take 1 tablet (325 mg total) by mouth 2 (two) times daily with a meal.   guaiFENesin 600 MG 12 hr tablet Commonly known as: MUCINEX Take by mouth 2 (two) times daily.   Insulin Pen Needle 32G X 8 MM Misc Use as directed   levothyroxine 50 MCG tablet Commonly known as: SYNTHROID Take 1 tablet (50 mcg total) by mouth daily before breakfast. What changed:   medication strength  how much to take   metFORMIN 500 MG tablet Commonly known as: Glucophage Take 1 tablet (500 mg total) by mouth 2 (two) times daily with a meal.   NovoLOG FlexPen 100 UNIT/ML FlexPen Generic drug: insulin aspart 0-15 Units, Subcutaneous, 3 times daily with meals CBG < 70: implement hypoglycemia protocol-call MD CBG 70 - 120: 0 units CBG 121 - 150: 2 units CBG 151 - 200: 3 units CBG 201 - 250: 5 units CBG 251 - 300: 8 units CBG 301 - 350: 11 units CBG 351 - 400: 15 units CBG > 400:       No Known Allergies   Consultations:   None   Other Procedures/Studies: Dg Chest 2 View  Result Date: 08/26/2019 CLINICAL DATA:  Post cough, COVID-19 positive EXAM: CHEST - 2 VIEW COMPARISON:  Radiograph 12/04/2018 FINDINGS: Widespread multifocal interstitial and patchy airspace opacities throughout both lungs. No pneumothorax. No effusion. Cardiac size at the upper limits of normal. Cardiomediastinal contours otherwise unremarkable. No acute osseous or soft tissue abnormality. IMPRESSION: Widespread multifocal interstitial and patchy airspace opacities throughout both lungs, consistent with multifocal pneumonia. Electronically Signed    By: Lovena Le M.D.   On: 08/26/2019 21:38     TODAY-DAY OF DISCHARGE:  Subjective:   Kristin Chavez today has no headache,no chest abdominal pain,no  new weakness tingling or numbness, feels much better wants to go home today.   Objective:   Blood pressure (!) 160/95, pulse 67, temperature 98.1 F (36.7 C), temperature source Oral, resp. rate 20, height _0  (1.676 m), weight 121.4 kg, last menstrual period 08/05/2019, SpO2 94 %.  Intake/Output Summary (Last 24 hours) at 08/31/2019 1036 Last data filed at 08/31/2019 0400 Gross per 24 hour  Intake 850 ml  Output 0 ml  Net 850 ml   Filed Weights   08/27/19 1537  Weight: 121.4 kg    Exam: Awake Alert, Oriented *3, No new F.N deficits, Normal affect .AT,PERRAL Supple Neck,No JVD, No cervical lymphadenopathy appriciated.  Symmetrical Chest wall movement, Good air movement bilaterally, CTAB RRR,No Gallops,Rubs or new Murmurs, No Parasternal Heave +ve B.Sounds, Abd Soft, Non tender, No organomegaly appriciated, No rebound -guarding or rigidity. No Cyanosis, Clubbing or edema, No new Rash or bruise   PERTINENT RADIOLOGIC STUDIES: Dg Chest 2 View  Result Date: 08/26/2019 CLINICAL DATA:  Post cough, COVID-19 positive EXAM: CHEST - 2 VIEW COMPARISON:  Radiograph 12/04/2018 FINDINGS: Widespread multifocal interstitial and patchy airspace opacities throughout both lungs. No pneumothorax. No effusion. Cardiac size at the upper limits of normal. Cardiomediastinal contours otherwise unremarkable. No acute osseous or soft tissue abnormality. IMPRESSION: Widespread multifocal interstitial and patchy airspace opacities throughout both lungs, consistent with multifocal pneumonia. Electronically Signed   By: Lovena Le M.D.   On: 08/26/2019 21:38     PERTINENT LAB RESULTS: CBC: Recent Labs    08/30/19 0024 08/31/19 0110  WBC 15.3* 14.7*  HGB 8.5* 8.7*  HCT 29.4* 29.7*  PLT 408* 448*   CMET CMP     Component Value  Date/Time   NA 137 08/31/2019 0110   NA 140 02/12/2018 0956   K 4.3 08/31/2019 0110   CL 101 08/31/2019 0110   CO2 25 08/31/2019 0110   GLUCOSE 202 (H) 08/31/2019 0110   BUN 22 (H) 08/31/2019 0110   BUN 10 02/12/2018 0956   CREATININE 0.67 08/31/2019 0110   CALCIUM 8.8 (L) 08/31/2019 0110   PROT 6.8 08/31/2019 0110   PROT 7.2 02/12/2018 0956   ALBUMIN 3.2 (L) 08/31/2019 0110   ALBUMIN 4.2 02/12/2018 0956   AST 16 08/31/2019 0110   ALT 15 08/31/2019 0110   ALKPHOS 46 08/31/2019 0110   BILITOT 0.5 08/31/2019 0110   BILITOT 0.3 02/12/2018 0956   GFRNONAA >60 08/31/2019 0110   GFRAA >60 08/31/2019 0110    GFR Estimated Creatinine Clearance: 112.9 mL/min (by C-G formula based on SCr of 0.67 mg/dL). No results for input(s): LIPASE, AMYLASE in the last 72 hours. No results for input(s): CKTOTAL, CKMB, CKMBINDEX, TROPONINI in the last 72 hours. Invalid input(s): POCBNP Recent Labs    08/30/19 0024 08/31/19 0110  DDIMER 0.38 0.35   No results for input(s): HGBA1C in the last 72 hours. No results for input(s): CHOL, HDL, LDLCALC, TRIG, CHOLHDL, LDLDIRECT in the last 72 hours. No results for input(s): TSH, T4TOTAL, T3FREE, THYROIDAB in the last 72 hours.  Invalid input(s): FREET3 Recent Labs    08/30/19 0024 08/31/19 0110  FERRITIN 203 377*   Coags: No results for input(s): INR in the last 72 hours.  Invalid input(s): PT Microbiology: No results found for this or any previous visit (from the past 240 hour(s)).  FURTHER DISCHARGE INSTRUCTIONS:  Get Medicines reviewed and adjusted: Please take all your medications with you for your next visit with your Primary MD  Laboratory/radiological data: Please  request your Primary MD to go over all hospital tests and procedure/radiological results at the follow up, please ask your Primary MD to get all Hospital records sent to his/her office.  In some cases, they will be blood work, cultures and biopsy results pending at the  time of your discharge. Please request that your primary care M.D. goes through all the records of your hospital data and follows up on these results.  Also Note the following: If you experience worsening of your admission symptoms, develop shortness of breath, life threatening emergency, suicidal or homicidal thoughts you must seek medical attention immediately by calling 911 or calling your MD immediately  if symptoms less severe.  You must read complete instructions/literature along with all the possible adverse reactions/side effects for all the Medicines you take and that have been prescribed to you. Take any new Medicines after you have completely understood and accpet all the possible adverse reactions/side effects.   Do not drive when taking Pain medications or sleeping medications (Benzodaizepines)  Do not take more than prescribed Pain, Sleep and Anxiety Medications. It is not advisable to combine anxiety,sleep and pain medications without talking with your primary care practitioner  Special Instructions: If you have smoked or chewed Tobacco  in the last 2 yrs please stop smoking, stop any regular Alcohol  and or any Recreational drug use.  Wear Seat belts while driving.  Please note: You were cared for by a hospitalist during your hospital stay. Once you are discharged, your primary care physician will handle any further medical issues. Please note that NO REFILLS for any discharge medications will be authorized once you are discharged, as it is imperative that you return to your primary care physician (or establish a relationship with a primary care physician if you do not have one) for your post hospital discharge needs so that they can reassess your need for medications and monitor your lab values.  Total Time spent coordinating discharge including counseling, education and face to face time equals 35 minutes.  SignedOren Binet 08/31/2019 10:36 AM

## 2019-08-31 NOTE — TOC Progression Note (Signed)
Transition of Care [facility]) - Progression Note    Patient Details  Name: Kristin Chavez MRN: [id number] Date of Birth: October 16, 1970  [facility] CM/SW Contact  Loletha Grayer Beverely Pace, RN Phone Number: 08/31/2019, 8:38 AM  Clinical Narrative:   Case manager monitoring for needs, last dose of Remdesivir scheduled for tomorrow. Patient will likely discharge home if medically stable.          Expected Discharge Plan and Services                                                 Social Determinants of Health (SDOH) Interventions    Readmission Risk Interventions No flowsheet data found.

## 2019-09-01 ENCOUNTER — Encounter: Payer: Self-pay | Admitting: Physician Assistant

## 2019-09-01 DIAGNOSIS — Z794 Long term (current) use of insulin: Secondary | ICD-10-CM

## 2019-09-01 DIAGNOSIS — E1129 Type 2 diabetes mellitus with other diabetic kidney complication: Secondary | ICD-10-CM

## 2019-09-02 ENCOUNTER — Encounter: Payer: Self-pay | Admitting: Physician Assistant

## 2019-09-02 ENCOUNTER — Telehealth: Payer: Self-pay

## 2019-09-02 MED ORDER — NOVOLOG FLEXPEN 100 UNIT/ML ~~LOC~~ SOPN: PEN_INJECTOR | SUBCUTANEOUS | 0 refills | Status: DC

## 2019-09-02 MED ORDER — LANTUS SOLOSTAR 100 UNIT/ML ~~LOC~~ SOPN: 15.0000 [IU] | PEN_INJECTOR | Freq: Every day | SUBCUTANEOUS | 3 refills | Status: DC

## 2019-09-02 NOTE — Progress Notes (Signed)
Patient required ICU admission and thus did not attend appointment.

## 2019-09-02 NOTE — Telephone Encounter (Signed)
Transition Care Management Follow-up Telephone Call  Date of discharge and from where: [facility] on 08/31/19.  How have you been since you were released from the hospital? Pt is tired but feels she is on the mend. Declined any current s/s or feeling worse. Diarrhea stopped as of yesterday.   Any questions or concerns? Yes concerned about PA for insulin.   Items Reviewed:  Did the pt receive and understand the discharge instructions provided? Yes   Medications obtained and verified? No, pt declined reviewing at the current time.   Any new allergies since your discharge? No   Dietary orders reviewed? Yes  Do you have support at home? Yes   Other (ie: DME, Home Health, etc) Spirometer and blood glucose kit.  Functional Questionnaire: (I = Independent and D = Dependent)  Bathing/Dressing- I   Meal Prep- I  Eating- I  Maintaining continence- I  Transferring/Ambulation- I  Managing Meds- I   Follow up appointments reviewed:    PCP Hospital f/u appt confirmed? No  , pt declined scheduling HFU at this time and would like to CB once feeling better to schedule a follow up.   Specialist Hospital f/u appt confirmed? N/A  Are transportation arrangements needed? No   If their condition worsens, is the pt aware to call  their PCP or go to the ED? Yes  Was the patient provided with contact information for the PCP's office or ED? Yes  Was the pt encouraged to call back with questions or concerns? Yes  

## 2019-09-02 NOTE — Telephone Encounter (Signed)
No HFU scheduled.  

## 2019-09-02 NOTE — Addendum Note (Signed)
Addended by: Mar Daring on: 09/02/2019 10:05 AM   Modules accepted: Orders

## 2019-09-04 ENCOUNTER — Encounter: Payer: Self-pay | Admitting: Physician Assistant

## 2019-09-05 NOTE — Discharge Summary (Signed)
PATIENT DETAILS Name: Kristin Chavez Age: 48 y.o. Sex: female Date of Birth: Jan 23, 1971 MRN: [id number]. Admitting Physician: Phillips Grout, MD [id number], Clearnce Sorrel, PA-C  Admit Date: 08/27/2019 Discharge date: 08/31/2019  Recommendations for Outpatient Follow-up:  1. Follow up with PCP in 1-2 weeks 2. Please obtain CMP/CBC in one week 3. Repeat Chest Xray in 4-6 week 4. Repeat TSH/A1c in 3 months 5. Consider referral to GYN for evaluation of menorrhagia 6. Recheck iron panel in 3 to 6 months.  Admitted From:  Home  Disposition: Grubbs: No  Equipment/Devices: None  Discharge Condition: Stable  CODE STATUS: FULL CODE  Diet recommendation:  Diet Order            Diet - low sodium heart healthy        Diet Carb Modified               Brief Summary: See H&P, Labs, Consult and Test reports for all details in brief, Patient is a 48 y.o. female with PMHx of Hashimoto's thyroiditis-noncompliant with levothyroxine, anemia secondary to chronic menorrhagia, obesity-diagnosed with COVID-19 a few days prior to this hospitalization-presented with shortness of breath-found to have acute hypoxic respiratory failure secondary to COVID-19 pneumonia.  Brief Hospital Course: Acute Hypoxic Resp Failure due to Covid 19 Viral pneumonia:  Much improved-inflammatory markers continue to downtrend-she has been on room air for the past few days.  Will complete last dose of remdesivir on 11/21 following which she will be discharged home.  She will be continued on tapering steroids on discharge.   COVID-19 Labs:  No results for input(s): DDIMER, FERRITIN, LDH, CRP in the last 72 hours.  No results found for: SARSCOV2NAA   COVID-19 Medications: Steroids: 11/17>> Remdesivir: 11/17>>11/21 Actemra: Not indicated Convalescent Plasma:Not indicated  Hypothyroidism (TSH 7.17 on 11/18):  Noncompliant with levothyroxine in the past 3 months-we will drop down  levothyroxine to 50 mcg daily dosing on discharge (previously on 100 mcg).  Please repeat TSH in 3 months.  Follow with PCP.  Iron deficiency anemia with microcytosis: Has history of chronic menorrhagia-received IV iron x1 on 11/18-requires iron supplementation on discharge.  Have asked patient to follow with her primary GYN doctor for further work-up of her menorrhagia.  DM-2 (A1c 7.4 on 08/28/2019): CBGs remain elevated as patient on steroids-managed with Lantus and SSI-we will continue with SSI on discharge while she is on steroids-given her elevated A1c-I will also place her on metformin on discharge.  Patient to follow with PCP for further optimization.  CBG (last 3)  No results for input(s): GLUCAP in the last 72 hours. Obesity: Estimated body mass index is 43.2 kg/m as calculated from the following:   Height as of this encounter: '5\' 6"'$  (1.676 m).   Weight as of this encounter: 121.4 kg.   Procedures/Studies: None  Discharge Diagnoses:  Principal Problem:   Pneumonia due to COVID-19 virus Active Problems:   Adult hypothyroidism   Obesity, Class III, BMI 40-49.9 (morbid obesity) (HCC)   Iron deficiency anemia secondary to inadequate dietary iron intake   Acute respiratory failure with hypoxia [facility])   Discharge Instructions:    Person Under Monitoring Name: Kristin Chavez  Location: [address] [place] [address]   Infection Prevention Recommendations for Individuals Confirmed to have, or Being Evaluated for, 2019 Novel Coronavirus (COVID-19) Infection Who Receive Care at Home  Individuals who are confirmed to have, or are being evaluated for, COVID-19 should follow the  prevention steps below until a healthcare provider or local or state health department says they can return to normal activities.  Stay home except to get medical care You should restrict activities outside your home, except for getting medical care. Do not go to work, school, or public areas,  and do not use public transportation or taxis.  Call ahead before visiting your doctor Before your medical appointment, call the healthcare provider and tell them that you have, or are being evaluated for, COVID-19 infection. This will help the healthcare providers office take steps to keep other people from getting infected. Ask your healthcare provider to call the local or state health department.  Monitor your symptoms Seek prompt medical attention if your illness is worsening (e.g., difficulty breathing). Before going to your medical appointment, call the healthcare provider and tell them that you have, or are being evaluated for, COVID-19 infection. Ask your healthcare provider to call the local or state health department.  Wear a facemask You should wear a facemask that covers your nose and mouth when you are in the same room with other people and when you visit a healthcare provider. People who live with or visit you should also wear a facemask while they are in the same room with you.  Separate yourself from other people in your home As much as possible, you should stay in a different room from other people in your home. Also, you should use a separate bathroom, if available.  Avoid sharing household items You should not share dishes, drinking glasses, cups, eating utensils, towels, bedding, or other items with other people in your home. After using these items, you should wash them thoroughly with soap and water.  Cover your coughs and sneezes Cover your mouth and nose with a tissue when you cough or sneeze, or you can cough or sneeze into your sleeve. Throw used tissues in a lined trash can, and immediately wash your hands with soap and water for at least 20 seconds or use an alcohol-based hand rub.  Wash your [facility] your hands often and thoroughly with soap and water for at least 20 seconds. You can use an alcohol-based hand sanitizer if soap and water are not  available and if your hands are not visibly dirty. Avoid touching your eyes, nose, and mouth with unwashed hands.   Prevention Steps for Caregivers and Household Members of Individuals Confirmed to have, or Being Evaluated for, COVID-19 Infection Being Cared for in the Home  If you live with, or provide care at home for, a person confirmed to have, or being evaluated for, COVID-19 infection please follow these guidelines to prevent infection:  Follow healthcare providers instructions Make sure that you understand and can help the patient follow any healthcare provider instructions for all care.  Provide for the patients basic needs You should help the patient with basic needs in the home and provide support for getting groceries, prescriptions, and other personal needs.  Monitor the patients symptoms If they are getting sicker, call his or her medical provider and tell them that the patient has, or is being evaluated for, COVID-19 infection. This will help the healthcare providers office take steps to keep other people from getting infected. Ask the healthcare provider to call the local or state health department.  Limit the number of people who have contact with the patient  If possible, have only one caregiver for the patient.  Other household members should stay in another home or place of residence.  If this is not possible, they should stay  in another room, or be separated from the patient as much as possible. Use a separate bathroom, if available.  Restrict visitors who do not have an essential need to be in the home.  Keep older adults, very young children, and other sick people away from the patient Keep older adults, very young children, and those who have compromised immune systems or chronic health conditions away from the patient. This includes people with chronic heart, lung, or kidney conditions, diabetes, and cancer.  Ensure good ventilation Make sure that  shared spaces in the home have good air flow, such as from an air conditioner or an opened window, weather permitting.  Wash your hands often  Wash your hands often and thoroughly with soap and water for at least 20 seconds. You can use an alcohol based hand sanitizer if soap and water are not available and if your hands are not visibly dirty.  Avoid touching your eyes, nose, and mouth with unwashed hands.  Use disposable paper towels to dry your hands. If not available, use dedicated cloth towels and replace them when they become wet.  Wear a facemask and gloves  Wear a disposable facemask at all times in the room and gloves when you touch or have contact with the patients blood, body fluids, and/or secretions or excretions, such as sweat, saliva, sputum, nasal mucus, vomit, urine, or feces.  Ensure the mask fits over your nose and mouth tightly, and do not touch it during use.  Throw out disposable facemasks and gloves after using them. Do not reuse.  Wash your hands immediately after removing your facemask and gloves.  If your personal clothing becomes contaminated, carefully remove clothing and launder. Wash your hands after handling contaminated clothing.  Place all used disposable facemasks, gloves, and other waste in a lined container before disposing them with other household waste.  Remove gloves and wash your hands immediately after handling these items.  Do not share dishes, glasses, or other household items with the patient  Avoid sharing household items. You should not share dishes, drinking glasses, cups, eating utensils, towels, bedding, or other items with a patient who is confirmed to have, or being evaluated for, COVID-19 infection.  After the person uses these items, you should wash them thoroughly with soap and water.  Wash laundry thoroughly  Immediately remove and wash clothes or bedding that have blood, body fluids, and/or secretions or excretions, such as  sweat, saliva, sputum, nasal mucus, vomit, urine, or feces, on them.  Wear gloves when handling laundry from the patient.  Read and follow directions on labels of laundry or clothing items and detergent. In general, wash and dry with the warmest temperatures recommended on the label.  Clean all areas the individual has used often  Clean all touchable surfaces, such as counters, tabletops, doorknobs, bathroom fixtures, toilets, phones, keyboards, tablets, and bedside tables, every day. Also, clean any surfaces that may have blood, body fluids, and/or secretions or excretions on them.  Wear gloves when cleaning surfaces the patient has come in contact with.  Use a diluted bleach solution (e.g., dilute bleach with 1 part bleach and 10 parts water) or a household disinfectant with a label that says EPA-registered for coronaviruses. To make a bleach solution at home, add 1 tablespoon of bleach to 1 quart (4 cups) of water. For a larger supply, add  cup of bleach to 1 gallon (16 cups) of water.  Read labels of  cleaning products and follow recommendations provided on product labels. Labels contain instructions for safe and effective use of the cleaning product including precautions you should take when applying the product, such as wearing gloves or eye protection and making sure you have good ventilation during use of the product.  Remove gloves and wash hands immediately after cleaning.  Monitor yourself for signs and symptoms of illness Caregivers and household members are considered close contacts, should monitor their health, and will be asked to limit movement outside of the home to the extent possible. Follow the monitoring steps for close contacts listed on the symptom monitoring form.   ? If you have additional questions, contact your local health department or call the epidemiologist on call at [phone] (available 24/7). ? This guidance is subject to change. For the most up-to-date  guidance from [facility], please refer to their website: YouBlogs.pl    Activity:  As tolerated  Discharge Instructions    MyChart COVID-19 home monitoring program   Complete by: Aug 31, 2019    Is the patient willing to use the [facility] for home monitoring?: Yes   Temperature monitoring   Complete by: Aug 31, 2019    After how many days would you like to receive a notification of this patient's flowsheet entries?: 1   Call MD for:  difficulty breathing, headache or visual disturbances   Complete by: As directed    Call MD for:  persistant dizziness or light-headedness   Complete by: As directed    Diet - low sodium heart healthy   Complete by: As directed    Diet Carb Modified   Complete by: As directed    Discharge instructions   Complete by: As directed    Follow with Primary MD  Mar Daring, PA-C in 1-2 weeks  Please ask your primary care practitioner to check a TSH, A1c in the next 3 months  Please get a complete blood count and chemistry panel checked by your Primary MD at your next visit, and again as instructed by your Primary MD.  Get Medicines reviewed and adjusted: Please take all your medications with you for your next visit with your Primary MD  Laboratory/radiological data: Please request your Primary MD to go over all hospital tests and procedure/radiological results at the follow up, please ask your Primary MD to get all Hospital records sent to his/her office.  In some cases, they will be blood work, cultures and biopsy results pending at the time of your discharge. Please request that your primary care M.D. follows up on these results.  Also Note the following: If you experience worsening of your admission symptoms, develop shortness of breath, life threatening emergency, suicidal or homicidal thoughts you must seek medical attention immediately by calling 911 or calling your MD  immediately  if symptoms less severe.  You must read complete instructions/literature along with all the possible adverse reactions/side effects for all the Medicines you take and that have been prescribed to you. Take any new Medicines after you have completely understood and accpet all the possible adverse reactions/side effects.   Do not drive when taking Pain medications or sleeping medications (Benzodaizepines)  Do not take more than prescribed Pain, Sleep and Anxiety Medications. It is not advisable to combine anxiety,sleep and pain medications without talking with your primary care practitioner  Special Instructions: If you have smoked or chewed Tobacco  in the last 2 yrs please stop smoking, stop any regular Alcohol  and or  any Recreational drug use.  Wear Seat belts while driving.  Please note: You were cared for by a hospitalist during your hospital stay. Once you are discharged, your primary care physician will handle any further medical issues. Please note that NO REFILLS for any discharge medications will be authorized once you are discharged, as it is imperative that you return to your primary care physician (or establish a relationship with a primary care physician if you do not have one) for your post hospital discharge needs so that they can reassess your need for medications and monitor your lab values.   3 weeks of quarantine/isolation from 08/21/2019 (day of Covid testing)   Increase activity slowly   Complete by: As directed      Allergies as of 08/31/2019   No Known Allergies     Medication List    TAKE these medications   acetaminophen 500 MG tablet Commonly known as: TYLENOL Take 500 mg by mouth every 6 (six) hours as needed for mild pain or fever.   blood glucose meter kit and supplies Dispense based on patient and insurance preference. Use up to four times daily as directed. (FOR ICD-10 E10.9, E11.9).   dexamethasone 6 MG tablet Commonly known as:  DECADRON Take 1 tablet (6 mg total) by mouth daily.   dicyclomine 20 MG tablet Commonly known as: BENTYL Take 20 mg by mouth 3 (three) times daily as needed (for cramps).   ferrous sulfate 325 (65 FE) MG tablet Take 1 tablet (325 mg total) by mouth 2 (two) times daily with a meal.   guaiFENesin 600 MG 12 hr tablet Commonly known as: MUCINEX Take by mouth 2 (two) times daily.   Insulin Pen Needle 32G X 8 MM Misc Use as directed   levothyroxine 50 MCG tablet Commonly known as: SYNTHROID Take 1 tablet (50 mcg total) by mouth daily before breakfast. What changed:   medication strength  how much to take   metFORMIN 500 MG tablet Commonly known as: Glucophage Take 1 tablet (500 mg total) by mouth 2 (two) times daily with a meal.      Follow-up Information    Mar Daring, PA-C. Schedule an appointment as soon as possible for a visit in 2 week(s).   Specialty: Family Medicine Contact information: [address] [place] [address] [phone]          No Known Allergies   Consultations:   None   Other Procedures/Studies: Dg Chest 2 View  Result Date: 08/26/2019 CLINICAL DATA:  Post cough, COVID-19 positive EXAM: CHEST - 2 VIEW COMPARISON:  Radiograph 12/04/2018 FINDINGS: Widespread multifocal interstitial and patchy airspace opacities throughout both lungs. No pneumothorax. No effusion. Cardiac size at the upper limits of normal. Cardiomediastinal contours otherwise unremarkable. No acute osseous or soft tissue abnormality. IMPRESSION: Widespread multifocal interstitial and patchy airspace opacities throughout both lungs, consistent with multifocal pneumonia. Electronically Signed   By: Lovena Le M.D.   On: 08/26/2019 21:38     TODAY-DAY OF DISCHARGE:  Subjective:   Airika Alkhatib today has no headache,no chest abdominal pain,no new weakness tingling or numbness, feels much better wants to go home today.   Objective:   Blood  pressure (!) 160/95, pulse 67, temperature 98.1 F (36.7 C), temperature source Oral, resp. rate 20, height '5\' 6"'$  (1.676 m), weight 121.4 kg, last menstrual period 08/05/2019, SpO2 94 %. No intake or output data in the 24 hours ending 09/05/19 1539 Filed Weights   08/27/19 1537  Weight: 121.4 kg    Exam: Awake Alert, Oriented *3, No new F.N deficits, Normal affect [place].AT,PERRAL Supple Neck,No JVD, No cervical lymphadenopathy appriciated.  Symmetrical Chest wall movement, Good air movement bilaterally, CTAB RRR,No Gallops,Rubs or new Murmurs, No Parasternal Heave +ve B.Sounds, Abd Soft, Non tender, No organomegaly appriciated, No rebound -guarding or rigidity. No Cyanosis, Clubbing or edema, No new Rash or bruise   PERTINENT RADIOLOGIC STUDIES: Dg Chest 2 View  Result Date: 08/26/2019 CLINICAL DATA:  Post cough, COVID-19 positive EXAM: CHEST - 2 VIEW COMPARISON:  Radiograph 12/04/2018 FINDINGS: Widespread multifocal interstitial and patchy airspace opacities throughout both lungs. No pneumothorax. No effusion. Cardiac size at the upper limits of normal. Cardiomediastinal contours otherwise unremarkable. No acute osseous or soft tissue abnormality. IMPRESSION: Widespread multifocal interstitial and patchy airspace opacities throughout both lungs, consistent with multifocal pneumonia. Electronically Signed   By: Lovena Le M.D.   On: 08/26/2019 21:38     PERTINENT LAB RESULTS: CBC: No results for input(s): WBC, HGB, HCT, PLT in the last 72 hours. CMET CMP     Component Value Date/Time   NA 137 08/31/2019 0110   NA 140 02/12/2018 0956   K 4.3 08/31/2019 0110   CL 101 08/31/2019 0110   CO2 25 08/31/2019 0110   GLUCOSE 202 (H) 08/31/2019 0110   BUN 22 (H) 08/31/2019 0110   BUN 10 02/12/2018 0956   CREATININE 0.67 08/31/2019 0110   CALCIUM 8.8 (L) 08/31/2019 0110   PROT 6.8 08/31/2019 0110   PROT 7.2 02/12/2018 0956   ALBUMIN 3.2 (L) 08/31/2019 0110   ALBUMIN 4.2 02/12/2018  0956   AST 16 08/31/2019 0110   ALT 15 08/31/2019 0110   ALKPHOS 46 08/31/2019 0110   BILITOT 0.5 08/31/2019 0110   BILITOT 0.3 02/12/2018 0956   GFRNONAA >60 08/31/2019 0110   GFRAA >60 08/31/2019 0110    GFR Estimated Creatinine Clearance: 112.9 mL/min (by C-G formula based on SCr of 0.67 mg/dL). No results for input(s): LIPASE, AMYLASE in the last 72 hours. No results for input(s): CKTOTAL, CKMB, CKMBINDEX, TROPONINI in the last 72 hours. Invalid input(s): POCBNP No results for input(s): DDIMER in the last 72 hours. No results for input(s): HGBA1C in the last 72 hours. No results for input(s): CHOL, HDL, LDLCALC, TRIG, CHOLHDL, LDLDIRECT in the last 72 hours. No results for input(s): TSH, T4TOTAL, T3FREE, THYROIDAB in the last 72 hours.  Invalid input(s): FREET3 No results for input(s): VITAMINB12, FOLATE, FERRITIN, TIBC, IRON, RETICCTPCT in the last 72 hours. Coags: No results for input(s): INR in the last 72 hours.  Invalid input(s): PT Microbiology: No results found for this or any previous visit (from the past 240 hour(s)).  FURTHER DISCHARGE INSTRUCTIONS:  Get Medicines reviewed and adjusted: Please take all your medications with you for your next visit with your Primary MD  Laboratory/radiological data: Please request your Primary MD to go over all hospital tests and procedure/radiological results at the follow up, please ask your Primary MD to get all Hospital records sent to his/her office.  In some cases, they will be blood work, cultures and biopsy results pending at the time of your discharge. Please request that your primary care M.D. goes through all the records of your hospital data and follows up on these results.  Also Note the following: If you experience worsening of your admission symptoms, develop shortness of breath, life threatening emergency, suicidal or homicidal thoughts you must seek medical attention immediately by calling 911 or calling your  MD  immediately  if symptoms less severe.  You must read complete instructions/literature along with all the possible adverse reactions/side effects for all the Medicines you take and that have been prescribed to you. Take any new Medicines after you have completely understood and accpet all the possible adverse reactions/side effects.   Do not drive when taking Pain medications or sleeping medications (Benzodaizepines)  Do not take more than prescribed Pain, Sleep and Anxiety Medications. It is not advisable to combine anxiety,sleep and pain medications without talking with your primary care practitioner  Special Instructions: If you have smoked or chewed Tobacco  in the last 2 yrs please stop smoking, stop any regular Alcohol  and or any Recreational drug use.  Wear Seat belts while driving.  Please note: You were cared for by a hospitalist during your hospital stay. Once you are discharged, your primary care physician will handle any further medical issues. Please note that NO REFILLS for any discharge medications will be authorized once you are discharged, as it is imperative that you return to your primary care physician (or establish a relationship with a primary care physician if you do not have one) for your post hospital discharge needs so that they can reassess your need for medications and monitor your lab values.  Total Time spent coordinating discharge including counseling, education and face to face time equals 35 minutes.  SignedOren Binet 09/05/2019 3:39 PM

## 2019-09-06 ENCOUNTER — Other Ambulatory Visit: Payer: Self-pay | Admitting: Family Medicine

## 2019-09-06 DIAGNOSIS — Z794 Long term (current) use of insulin: Secondary | ICD-10-CM

## 2019-09-06 DIAGNOSIS — E1129 Type 2 diabetes mellitus with other diabetic kidney complication: Secondary | ICD-10-CM

## 2019-09-06 MED ORDER — INSULIN LISPRO (1 UNIT DIAL) 100 UNIT/ML (KWIKPEN): PEN_INJECTOR | SUBCUTANEOUS | 3 refills | Status: DC

## 2019-09-13 ENCOUNTER — Encounter: Payer: Self-pay | Admitting: Physician Assistant

## 2019-09-13 ENCOUNTER — Telehealth: Payer: Self-pay

## 2019-09-13 NOTE — Telephone Encounter (Signed)
Can you see her test results? I only see the message but not the test

## 2019-09-13 NOTE — Progress Notes (Signed)
Patient: Kristin Chavez Female    DOB: 1971/01/29   48 y.o.   MRN: [id number] Visit Date: 09/17/2019  Today's Provider: Mar Daring, PA-C   Chief Complaint  Patient presents with  . Hospitalization Follow-up   Subjective:     Virtual Visit via Video Note  I connected with Kristin Chavez on 09/17/19 at  1:20 PM EST by a video enabled telemedicine application and verified that I am speaking with the correct person using two identifiers.  Location: Patient: Home Provider: [facility]   I discussed the limitations of evaluation and management by telemedicine and the availability of in person appointments. The patient expressed understanding and agreed to proceed.   HPI   Follow up Hospitalization  Patient was admitted to [facility] on 08/27/2019 and discharged on 08/31/2019 She was treated for  Pneumonia due to COVID-19 virus Treatment for this included remdesivir and steroids Telephone follow up was done on 09/02/2019 She reports excellent compliance with treatment. She reports this condition is Improved. Recommendations:Repeat Chest Xray in 4-6 week,Repeat TSH/A1c in 3 months, Recheck iron panel in 3 to 6 months.  ---------------------------------------------------------------------------------- Patient also wants to talk about her Diabetes Reports that her sugar levels at home EGB:TDVVOHY: upper 150's-160's in the morning, 225 in the evening. Mid day 120-140's. She is on Metformin, Lantus,and Humalog.   No Known Allergies   Current Outpatient Medications:  .  acetaminophen (TYLENOL) 500 MG tablet, Take 500 mg by mouth every 6 (six) hours as needed for mild pain or fever., Disp: , Rfl:  .  blood glucose meter kit and supplies, Dispense based on patient and insurance preference. Use up to four times daily as directed. (FOR ICD-10 E10.9, E11.9)., Disp: 1 each, Rfl: 0 .  dicyclomine (BENTYL) 20 MG tablet, Take 20 mg by mouth 3 (three) times daily as needed (for  cramps)., Disp: , Rfl:  .  ferrous sulfate 325 (65 FE) MG tablet, Take 1 tablet (325 mg total) by mouth 2 (two) times daily with a meal., Disp: 60 tablet, Rfl: 0 .  Insulin Glargine (LANTUS SOLOSTAR) 100 UNIT/ML Solostar Pen, Inject 15 Units into the skin daily., Disp: 15 pen, Rfl: 3 .  insulin lispro (HUMALOG KWIKPEN) 100 UNIT/ML KwikPen, 0-15 Units, Subcutaneous, 3 times daily with meals CBG < 70: implement hypoglycemia protocol-call MD CBG 70 - 120: 0 units CBG 121 - 150: 2 units CBG 151 - 200: 3 units CBG 201 - 250: 5 units CBG 251 - 300: 8 units CBG 301 - 350: 11 units CBG 351 - 400: 15 units CBG > 400., Disp: 15 mL, Rfl: 3 .  Insulin Pen Needle 32G X 8 MM MISC, Use as directed, Disp: 100 each, Rfl: 0 .  levothyroxine (SYNTHROID) 50 MCG tablet, Take 1 tablet (50 mcg total) by mouth daily before breakfast., Disp: 30 tablet, Rfl: 0 .  metFORMIN (GLUCOPHAGE) 500 MG tablet, Take 1 tablet (500 mg total) by mouth 2 (two) times daily with a meal., Disp: 60 tablet, Rfl: 0 .  dexamethasone (DECADRON) 6 MG tablet, Take 1 tablet (6 mg total) by mouth daily. (Patient not taking: Reported on 09/16/2019), Disp: 5 tablet, Rfl: 0  Review of Systems  Constitutional: Negative.   HENT: Negative.   Respiratory: Negative.   Cardiovascular: Negative.   Endocrine: Negative.   Neurological: Negative.     Social History   Tobacco Use  . Smoking status: Never Smoker  . Smokeless tobacco: Never Used  Substance  Use Topics  . Alcohol use: No      Objective:   There were no vitals taken for this visit. There were no vitals filed for this visit.There is no height or weight on file to calculate BMI.   Physical Exam Vitals signs reviewed.  Constitutional:      Appearance: Normal appearance. She is well-developed.  HENT:     Head: Normocephalic and atraumatic.  Neck:     Musculoskeletal: Normal range of motion and neck supple.  Pulmonary:     Effort: Pulmonary effort is normal. No respiratory distress.   Neurological:     Mental Status: She is alert.  Psychiatric:        Mood and Affect: Mood normal.        Behavior: Behavior normal.        Thought Content: Thought content normal.        Judgment: Judgment normal.      No results found for any visits on 09/17/19.     Assessment & Plan     1. Hypothyroidism due to acquired atrophy of thyroid Stable. Diagnosis pulled for medication refill. Continue current medical treatment plan. Will check labs when she returns in late January to early February.  - levothyroxine (SYNTHROID) 50 MCG tablet; Take 1 tablet (50 mcg total) by mouth daily before breakfast.  Dispense: 90 tablet; Refill: 1  2. History of pneumonia Had Covid 19. Was hospitalized on 08/27/19 - 08/31/19 with pneumonia secondary to Covid-19. Has completed all treatment. Feels well. Will get repeat CXR for clearance as below. I will f/u pending results.  - DG Chest 2 View; Future  3. History of 2019 novel coronavirus disease (COVID-19) See above medical treatment plan. - DG Chest 2 View; Future  4. Type 2 diabetes mellitus with hyperglycemia, without long-term current use of insulin (HCC) Improving now she is off steroids. Will stop Humalog this week. Check BS once daily. Stop Lantus next Monday (09/23/19). Continue Metformin 568m BID. I will see her back in January-February for A1c recheck. Call if fasting morning levels start increasing close to 200.   5. Iron deficiency anemia secondary to inadequate dietary iron intake Continue Ferrous sulfate BID for now. Repeat labs when she returns.    I discussed the assessment and treatment plan with the patient. The patient was provided an opportunity to ask questions and all were answered. The patient agreed with the plan and demonstrated an understanding of the instructions.   The patient was advised to call back or seek an in-person evaluation if the symptoms worsen or if the condition fails to improve as anticipated.  I  provided 13 minutes of non-face-to-face time during this encounter.     JMar Daring PA-C  [facility]

## 2019-09-13 NOTE — Telephone Encounter (Signed)
I guess patient may have to call, or we can try to call, the [facility] tested at and have them fax the results to our office.

## 2019-09-13 NOTE — Telephone Encounter (Signed)
Patient is going to try to forward the test results if not able to she is going to call [facility] to fax it to [place]. Thanks.

## 2019-09-13 NOTE — Telephone Encounter (Signed)
From [facility]   Copied from [place] [phone]. Topic: General - Other >> Sep 13, 2019 11:17 AM Rayann Heman wrote: Reason for CRM: Alphonzo Grieve calling health information management called and states that they need to know if we have received a copy of positive covid results from [place] states that they can not code the bill until they receive the positive covid result. Please advise

## 2019-09-13 NOTE — Telephone Encounter (Signed)
Patient forward test results through my chart. Called and left message for Charlett Lango to call us back.  OK for [facility] to relay message.

## 2019-09-13 NOTE — Telephone Encounter (Signed)
Result is now available in attachment in mychart message from patient today.

## 2019-09-17 ENCOUNTER — Encounter: Payer: Self-pay | Admitting: Physician Assistant

## 2019-09-17 ENCOUNTER — Telehealth (INDEPENDENT_AMBULATORY_CARE_PROVIDER_SITE_OTHER): Payer: 59 | Admitting: Physician Assistant

## 2019-09-17 DIAGNOSIS — E034 Atrophy of thyroid (acquired): Secondary | ICD-10-CM | POA: Diagnosis not present

## 2019-09-17 DIAGNOSIS — E1165 Type 2 diabetes mellitus with hyperglycemia: Secondary | ICD-10-CM

## 2019-09-17 DIAGNOSIS — Z8701 Personal history of pneumonia (recurrent): Secondary | ICD-10-CM

## 2019-09-17 DIAGNOSIS — Z8619 Personal history of other infectious and parasitic diseases: Secondary | ICD-10-CM

## 2019-09-17 DIAGNOSIS — D508 Other iron deficiency anemias: Secondary | ICD-10-CM

## 2019-09-17 DIAGNOSIS — Z8616 Personal history of COVID-19: Secondary | ICD-10-CM

## 2019-09-17 MED ORDER — LEVOTHYROXINE SODIUM 50 MCG PO TABS: 50.0000 ug | ORAL_TABLET | Freq: Every day | ORAL | 1 refills | Status: DC

## 2019-09-17 NOTE — Patient Instructions (Signed)
Living With Diabetes °Diabetes (type 1 diabetes mellitus or type 2 diabetes mellitus) is a condition in which the body does not have enough of a hormone called insulin, or the body does not respond properly to insulin. Normally, insulin allows sugars (glucose) to enter cells in the body. The cells use glucose for energy. With diabetes, extra glucose builds up in the blood instead of going into cells, which results in high blood glucose (hyperglycemia). °How to manage lifestyle changes °Managing diabetes includes medical treatments as well as lifestyle changes. If diabetes is not managed well, serious physical and emotional complications can occur. Taking good care of yourself means that you are responsible for: °· Monitoring glucose regularly. °· Eating a healthy diet. °· Exercising regularly. °· Meeting with health care providers. °· Taking medicines as directed. °Some people may feel a lot of stress about managing their diabetes. This is known as emotional distress, and it is very common. Living with diabetes can place you at risk for emotional distress, depression, or anxiety. These disorders can be confusing and can make diabetes management more difficult. °How to recognize stress °Emotional distress °Symptoms of emotional distress include: °· Anger about having a diagnosis of diabetes. °· Fear or frustration about your diagnosis and the changes you need to make to manage the condition. °· Being overly worried about the care that you need or the cost of the care that you need. °· Feeling like you caused your condition by doing something wrong. °· Fear of unpredictable situations, like low or high blood glucose. °· Feeling judged by your health care providers. °· Feeling very alone with the disease. °· Getting too tired or worn out with the demands of daily care. °Depression °Having diabetes means that you are at a higher risk for depression. Having depression also means that you are at a higher risk for  diabetes. Your health care provider may test (screen) you for symptoms of depression. It is important to recognize depression symptoms and to start treatment for depression soon after it is diagnosed. The following are some symptoms of depression: °· Loss of interest in things that you used to enjoy. °· Trouble sleeping, or often waking up early and not being able to get back to sleep. °· A change in appetite. °· Feeling tired most of the day. °· Feeling nervous and anxious. °· Feeling guilty and worrying that you are a burden to others. °· Feeling depressed more often than you do not feel that way. °· Thoughts of hurting yourself or feeling that you want to die. °If you have any of these symptoms for 2 weeks or longer, reach out to a health care provider. °Follow these instructions at home: °Managing emotional distress °The following are some ways to manage emotional distress: °· Talk with your health care provider or certified diabetes educator. Consider working with a counselor or therapist. °· Learn as much as you can about diabetes and its treatment. Meet with a certified diabetes educator or take a class to learn how to manage your condition. °· Keep a journal of your thoughts and concerns. °· Accept that some things are out of your control. °· Talk with other people who have diabetes. It can help to talk with others about the emotional distress that you feel. °· Find ways to manage stress that work for you. These may include art or music therapy, exercise, meditation, and hobbies. °· Seek support from spiritual leaders, family, and friends. °General instructions °· Follow your diabetes management plan. °·   Keep all follow-up visits as told by your health care provider. This is important. Where to find support   Ask your health care provider to recommend a therapist who understands both depression and diabetes.  Search for information and support from the American Diabetes Association:  www.diabetes.org  Find a certified diabetes educator and make an appointment through [place] [employer] Diabetes Educators: www.diabeteseducator.org Get help right away if:  You have thoughts about hurting yourself or others. If you ever feel like you may hurt yourself or others, or have thoughts about taking your own life, get help right away. You can go to your nearest emergency department or call:  Your local emergency services (911 in the [place]  A suicide crisis helpline, such as the [facility] at (807) 141-8987. This is open 24 hours a day. Summary  Diabetes (type 1 diabetes mellitus or type 2 diabetes mellitus) is a condition in which the body does not have enough of a hormone called insulin, or the body does not respond properly to insulin.  Living with diabetes puts you at risk for medical issues, and it also puts you at risk for emotional issues such as emotional distress, depression, and anxiety.  Recognizing the symptoms of emotional distress and depression may help you avoid problems with your diabetes control. It is important to start treatment for emotional distress and depression soon after they are diagnosed.  Having diabetes means that you are at a higher risk for depression. Ask your health care provider to recommend a therapist who understands both depression and diabetes.  If you experience symptoms of emotional distress or depression, it is important to discuss this with your health care provider, certified diabetes educator, or therapist. This information is not intended to replace advice given to you by your health care provider. Make sure you discuss any questions you have with your health care provider. Document Released: 02/09/2017 Document Revised: 10/08/2018 Document Reviewed: 02/09/2017 Elsevier Patient Education  [place].   Diabetes Mellitus and Nutrition, Adult When you have diabetes (diabetes mellitus), it is very  important to have healthy eating habits because your blood sugar (glucose) levels are greatly affected by what you eat and drink. Eating healthy foods in the appropriate amounts, at about the same times every day, can help you:  Control your blood glucose.  Lower your risk of heart disease.  Improve your blood pressure.  Reach or maintain a healthy weight. Every person with diabetes is different, and each person has different needs for a meal plan. Your health care provider may recommend that you work with a diet and nutrition specialist (dietitian) to make a meal plan that is best for you. Your meal plan may vary depending on factors such as:  The calories you need.  The medicines you take.  Your weight.  Your blood glucose, blood pressure, and cholesterol levels.  Your activity level.  Other health conditions you have, such as heart or kidney disease. How do carbohydrates affect me? Carbohydrates, also called carbs, affect your blood glucose level more than any other type of food. Eating carbs naturally raises the amount of glucose in your blood. Carb counting is a method for keeping track of how many carbs you eat. Counting carbs is important to keep your blood glucose at a healthy level, especially if you use insulin or take certain oral diabetes medicines. It is important to know how many carbs you can safely have in each meal. This is different for every person.  Your dietitian can help you calculate how many carbs you should have at each meal and for each snack. Foods that contain carbs include:  Bread, cereal, rice, pasta, and crackers.  Potatoes and corn.  Peas, beans, and lentils.  Milk and yogurt.  Fruit and juice.  Desserts, such as cakes, cookies, ice cream, and candy. How does alcohol affect me? Alcohol can cause a sudden decrease in blood glucose (hypoglycemia), especially if you use insulin or take certain oral diabetes medicines. Hypoglycemia can be a  life-threatening condition. Symptoms of hypoglycemia (sleepiness, dizziness, and confusion) are similar to symptoms of having too much alcohol. If your health care provider says that alcohol is safe for you, follow these guidelines:  Limit alcohol intake to no more than 1 drink per day for nonpregnant women and 2 drinks per day for men. One drink equals 12 oz of beer, 5 oz of wine, or 1 oz of hard liquor.  Do not drink on an empty stomach.  Keep yourself hydrated with water, diet soda, or unsweetened iced tea.  Keep in mind that regular soda, juice, and other mixers may contain a lot of sugar and must be counted as carbs. What are tips for following this plan?  Reading food labels  Start by checking the serving size on the "Nutrition Facts" label of packaged foods and drinks. The amount of calories, carbs, fats, and other nutrients listed on the label is based on one serving of the item. Many items contain more than one serving per package.  Check the total grams (g) of carbs in one serving. You can calculate the number of servings of carbs in one serving by dividing the total carbs by 15. For example, if a food has 30 g of total carbs, it would be equal to 2 servings of carbs.  Check the number of grams (g) of saturated and trans fats in one serving. Choose foods that have low or no amount of these fats.  Check the number of milligrams (mg) of salt (sodium) in one serving. Most people should limit total sodium intake to less than 2,300 mg per day.  Always check the nutrition information of foods labeled as "low-fat" or "nonfat". These foods may be higher in added sugar or refined carbs and should be avoided.  Talk to your dietitian to identify your daily goals for nutrients listed on the label. Shopping  Avoid buying canned, premade, or processed foods. These foods tend to be high in fat, sodium, and added sugar.  Shop around the outside edge of the grocery store. This includes fresh  fruits and vegetables, bulk grains, fresh meats, and fresh dairy. Cooking  Use low-heat cooking methods, such as baking, instead of high-heat cooking methods like deep frying.  Cook using healthy oils, such as olive, canola, or sunflower oil.  Avoid cooking with butter, cream, or high-fat meats. Meal planning  Eat meals and snacks regularly, preferably at the same times every day. Avoid going long periods of time without eating.  Eat foods high in fiber, such as fresh fruits, vegetables, beans, and whole grains. Talk to your dietitian about how many servings of carbs you can eat at each meal.  Eat 4-6 ounces (oz) of lean protein each day, such as lean meat, chicken, fish, eggs, or tofu. One oz of lean protein is equal to: ? 1 oz of meat, chicken, or fish. ? 1 egg. ?  cup of tofu.  Eat some foods each day that contain healthy fats, such  as avocado, nuts, seeds, and fish. Lifestyle  Check your blood glucose regularly.  Exercise regularly as told by your health care provider. This may include: ? 150 minutes of moderate-intensity or vigorous-intensity exercise each week. This could be brisk walking, biking, or water aerobics. ? Stretching and doing strength exercises, such as yoga or weightlifting, at least 2 times a week.  Take medicines as told by your health care provider.  Do not use any products that contain nicotine or tobacco, such as cigarettes and e-cigarettes. If you need help quitting, ask your health care provider.  Work with a Veterinary surgeoncounselor or diabetes educator to identify strategies to manage stress and any emotional and social challenges. Questions to ask a health care provider  Do I need to meet with a diabetes educator?  Do I need to meet with a dietitian?  What number can I call if I have questions?  When are the best times to check my blood glucose? Where to find more information:  American Diabetes Association: diabetes.org  Academy of Nutrition and  Dietetics: www.eatright.[employer]  National Institute of Diabetes and Digestive and Kidney Diseases (NIH): [employer].tnwww.niddk.nih.gov Summary  A healthy meal plan will help you control your blood glucose and maintain a healthy lifestyle.  Working with a diet and nutrition specialist (dietitian) can help you make a meal plan that is best for you.  Keep in mind that carbohydrates (carbs) and alcohol have immediate effects on your blood glucose levels. It is important to count carbs and to use alcohol carefully. This information is not intended to replace advice given to you by your health care provider. Make sure you discuss any questions you have with your health care provider. Document Released: 06/23/2005 Document Revised: 09/08/2017 Document Reviewed: 10/31/2016 Elsevier Patient Education  2020 Elsevier Inc.   Carbohydrate Counting for Diabetes Mellitus, Adult  Carbohydrate counting is a method of keeping track of how many carbohydrates you eat. Eating carbohydrates naturally increases the amount of sugar (glucose) in the blood. Counting how many carbohydrates you eat helps keep your blood glucose within normal limits, which helps you manage your diabetes (diabetes mellitus). It is important to know how many carbohydrates you can safely have in each meal. This is different for every person. A diet and nutrition specialist (registered dietitian) can help you make a meal plan and calculate how many carbohydrates you should have at each meal and snack. Carbohydrates are found in the following foods:  Grains, such as breads and cereals.  Dried beans and soy products.  Starchy vegetables, such as potatoes, peas, and corn.  Fruit and fruit juices.  Milk and yogurt.  Sweets and snack foods, such as cake, cookies, candy, chips, and soft drinks. How do I count carbohydrates? There are two ways to count carbohydrates in food. You can use either of the methods or a combination of both. Reading "Nutrition  Facts" on packaged food The "Nutrition Facts" list is included on the labels of almost all packaged foods and beverages in the [place] It includes:  The serving size.  Information about nutrients in each serving, including the grams (g) of carbohydrate per serving. To use the Nutrition Facts":  Decide how many servings you will have.  Multiply the number of servings by the number of carbohydrates per serving.  The resulting number is the total amount of carbohydrates that you will be having. Learning standard serving sizes of other foods When you eat carbohydrate foods that are not packaged or do not include "Nutrition Facts"  on the label, you need to measure the servings in order to count the amount of carbohydrates:  Measure the foods that you will eat with a food scale or measuring cup, if needed.  Decide how many standard-size servings you will eat.  Multiply the number of servings by 15. Most carbohydrate-rich foods have about 15 g of carbohydrates per serving. ? For example, if you eat 8 oz (170 g) of strawberries, you will have eaten 2 servings and 30 g of carbohydrates (2 servings x 15 g = 30 g).  For foods that have more than one food mixed, such as soups and casseroles, you must count the carbohydrates in each food that is included. The following list contains standard serving sizes of common carbohydrate-rich foods. Each of these servings has about 15 g of carbohydrates:   hamburger bun or  English muffin.   oz (15 mL) syrup.   oz (14 g) jelly.  1 slice of bread.  1 six-inch tortilla.  3 oz (85 g) cooked rice or pasta.  4 oz (113 g) cooked dried beans.  4 oz (113 g) starchy vegetable, such as peas, corn, or potatoes.  4 oz (113 g) hot cereal.  4 oz (113 g) mashed potatoes or  of a large baked potato.  4 oz (113 g) canned or frozen fruit.  4 oz (120 mL) fruit juice.  4-6 crackers.  6 chicken nuggets.  6 oz (170 g) unsweetened dry cereal.  6 oz (170  g) plain fat-free yogurt or yogurt sweetened with artificial sweeteners.  8 oz (240 mL) milk.  8 oz (170 g) fresh fruit or one small piece of fruit.  24 oz (680 g) popped popcorn. Example of carbohydrate counting Sample meal  3 oz (85 g) chicken breast.  6 oz (170 g) brown rice.  4 oz (113 g) corn.  8 oz (240 mL) milk.  8 oz (170 g) strawberries with sugar-free whipped topping. Carbohydrate calculation 1. Identify the foods that contain carbohydrates: ? Rice. ? Corn. ? Milk. ? Strawberries. 2. Calculate how many servings you have of each food: ? 2 servings rice. ? 1 serving corn. ? 1 serving milk. ? 1 serving strawberries. 3. Multiply each number of servings by 15 g: ? 2 servings rice x 15 g = 30 g. ? 1 serving corn x 15 g = 15 g. ? 1 serving milk x 15 g = 15 g. ? 1 serving strawberries x 15 g = 15 g. 4. Add together all of the amounts to find the total grams of carbohydrates eaten: ? 30 g + 15 g + 15 g + 15 g = 75 g of carbohydrates total. Summary  Carbohydrate counting is a method of keeping track of how many carbohydrates you eat.  Eating carbohydrates naturally increases the amount of sugar (glucose) in the blood.  Counting how many carbohydrates you eat helps keep your blood glucose within normal limits, which helps you manage your diabetes.  A diet and nutrition specialist (registered dietitian) can help you make a meal plan and calculate how many carbohydrates you should have at each meal and snack. This information is not intended to replace advice given to you by your health care provider. Make sure you discuss any questions you have with your health care provider. Document Released: 09/26/2005 Document Revised: 04/20/2017 Document Reviewed: 03/09/2016 Elsevier Patient Education  2020 [employer].

## 2019-09-18 ENCOUNTER — Encounter: Payer: Self-pay | Admitting: Physician Assistant

## 2019-09-18 DIAGNOSIS — Z8701 Personal history of pneumonia (recurrent): Secondary | ICD-10-CM

## 2019-09-24 ENCOUNTER — Other Ambulatory Visit: Payer: Self-pay

## 2019-09-24 ENCOUNTER — Ambulatory Visit
Admission: RE | Admit: 2019-09-24 | Discharge: 2019-09-24 | Disposition: A | Discharge status: Home / Self Care | Payer: 59 | Source: Ambulatory Visit | Attending: Physician Assistant | Admitting: Physician Assistant

## 2019-09-24 ENCOUNTER — Ambulatory Visit
Admission: RE | Admit: 2019-09-24 | Discharge: 2019-09-24 | Disposition: A | Discharge status: Home / Self Care | Payer: 59 | Attending: Physician Assistant | Admitting: Physician Assistant

## 2019-09-24 DIAGNOSIS — Z8701 Personal history of pneumonia (recurrent): Secondary | ICD-10-CM | POA: Insufficient documentation

## 2019-09-24 DIAGNOSIS — Z8616 Personal history of COVID-19: Secondary | ICD-10-CM

## 2019-09-24 DIAGNOSIS — Z8619 Personal history of other infectious and parasitic diseases: Secondary | ICD-10-CM | POA: Insufficient documentation

## 2019-09-24 MED ORDER — ALBUTEROL SULFATE HFA 108 (90 BASE) MCG/ACT IN AERS: 2.0000 | INHALATION_SPRAY | Freq: Four times a day (QID) | RESPIRATORY_TRACT | 1 refills | Status: DC | PRN

## 2019-09-24 NOTE — Addendum Note (Signed)
Addended by: Mar Daring on: 09/24/2019 03:15 PM   Modules accepted: Orders

## 2019-09-25 MED ORDER — FERROUS SULFATE 325 (65 FE) MG PO TABS: 325.0000 mg | ORAL_TABLET | Freq: Two times a day (BID) | ORAL | 0 refills | Status: DC

## 2019-09-25 MED ORDER — METFORMIN HCL 500 MG PO TABS: 500.0000 mg | ORAL_TABLET | Freq: Two times a day (BID) | ORAL | 0 refills | Status: DC

## 2019-09-25 NOTE — Addendum Note (Signed)
Addended by: Doristine Devoid on: 09/25/2019 01:20 PM   Modules accepted: Orders

## 2019-10-17 ENCOUNTER — Other Ambulatory Visit: Payer: Self-pay | Admitting: Physician Assistant

## 2019-11-01 ENCOUNTER — Telehealth: Payer: 59 | Admitting: Nurse Practitioner

## 2019-11-01 DIAGNOSIS — R059 Cough, unspecified: Secondary | ICD-10-CM

## 2019-11-01 DIAGNOSIS — J208 Acute bronchitis due to other specified organisms: Secondary | ICD-10-CM

## 2019-11-01 DIAGNOSIS — R05 Cough: Secondary | ICD-10-CM

## 2019-11-01 MED ORDER — PREDNISONE 10 MG PO TABS: 10.0000 mg | ORAL_TABLET | Freq: Every day | ORAL | 0 refills | Status: AC

## 2019-11-01 MED ORDER — PREDNISONE 10 MG (21) PO TBPK: ORAL_TABLET | ORAL | 0 refills | Status: DC

## 2019-11-01 MED ORDER — BENZONATATE 100 MG PO CAPS: 100.0000 mg | ORAL_CAPSULE | Freq: Three times a day (TID) | ORAL | 0 refills | Status: AC | PRN

## 2019-11-01 NOTE — Progress Notes (Signed)
We are sorry that you are not feeling well.  Here is how we plan to help!  Based on your presentation I believe you most likely have A cough due to a virus.  This is called viral bronchitis and is best treated by rest, plenty of fluids and control of the cough.  You may use Ibuprofen or Tylenol as directed to help your symptoms.  I do not feel an antibiotic is needed at this time based on the duration of your symptoms and the symptoms you are currently describing.    In addition you may use A prescription cough medication called Tessalon Perles 100mg . You may take 1-2 capsules every 8 hours as needed for your cough.  Prednisone 10 mg daily for 5 days. Take as directed.  From your responses in the eVisit questionnaire you describe inflammation in the upper respiratory tract which is causing a significant cough.  This is commonly called Bronchitis and has four common causes:    Allergies  Viral Infections  Acid Reflux  Bacterial Infection Allergies, viruses and acid reflux are treated by controlling symptoms or eliminating the cause. An example might be a cough caused by taking certain blood pressure medications. You stop the cough by changing the medication. Another example might be a cough caused by acid reflux. Controlling the reflux helps control the cough.  USE OF BRONCHODILATOR ("RESCUE") INHALERS: There is a risk from using your bronchodilator too frequently.  The risk is that over-reliance on a medication which only relaxes the muscles surrounding the breathing tubes can reduce the effectiveness of medications prescribed to reduce swelling and congestion of the tubes themselves.  Although you feel brief relief from the bronchodilator inhaler, your asthma may actually be worsening with the tubes becoming more swollen and filled with mucus.  This can delay other crucial treatments, such as oral steroid medications. If you need to use a bronchodilator inhaler daily, several times per day, you  should discuss this with your provider.  There are probably better treatments that could be used to keep your asthma under control.     HOME CARE . Only take medications as instructed by your medical team. . Complete the entire course of an antibiotic. . Drink plenty of fluids and get plenty of rest. . Avoid close contacts especially the very young and the elderly . Cover your mouth if you cough or cough into your sleeve. . Always remember to wash your hands . A steam or ultrasonic humidifier can help congestion.   GET HELP RIGHT AWAY IF: . You develop worsening fever. . You become short of breath . You cough up blood. . Your symptoms persist after you have completed your treatment plan MAKE SURE YOU   Understand these instructions.  Will watch your condition.  Will get help right away if you are not doing well or get worse.  Your e-visit answers were reviewed by a board certified advanced clinical practitioner to complete your personal care plan.  Depending on the condition, your plan could have included both over the counter or prescription medications. If there is a problem please reply  once you have received a response from your provider. Your safety is important to [place].  If you have drug allergies check your prescription carefully.    You can use MyChart to ask questions about today's visit, request a non-urgent call back, or ask for a work or school excuse for 24 hours related to this e-Visit. If it has been greater than 24  hours you will need to follow up with your provider, or enter a new e-Visit to address those concerns. You will get an e-mail in the next two days asking about your experience.  I hope that your e-visit has been valuable and will speed your recovery. Thank you for using e-visits.  I have spent at least 5 minutes reviewing and documenting in the patient's chart.

## 2019-11-20 ENCOUNTER — Other Ambulatory Visit: Payer: Self-pay | Admitting: Physician Assistant

## 2019-12-04 ENCOUNTER — Other Ambulatory Visit: Payer: Self-pay | Admitting: Physician Assistant

## 2020-02-13 ENCOUNTER — Other Ambulatory Visit: Payer: Self-pay

## 2020-02-13 ENCOUNTER — Encounter: Payer: Self-pay | Admitting: Physician Assistant

## 2020-02-13 ENCOUNTER — Ambulatory Visit: Payer: 59 | Admitting: Physician Assistant

## 2020-02-13 VITALS — BP 149/91 | HR 79 | Temp 97.1°F | Wt 295.4 lb

## 2020-02-13 DIAGNOSIS — E559 Vitamin D deficiency, unspecified: Secondary | ICD-10-CM | POA: Diagnosis not present

## 2020-02-13 DIAGNOSIS — I10 Essential (primary) hypertension: Secondary | ICD-10-CM

## 2020-02-13 DIAGNOSIS — K580 Irritable bowel syndrome with diarrhea: Secondary | ICD-10-CM

## 2020-02-13 DIAGNOSIS — E039 Hypothyroidism, unspecified: Secondary | ICD-10-CM | POA: Diagnosis not present

## 2020-02-13 DIAGNOSIS — D508 Other iron deficiency anemias: Secondary | ICD-10-CM

## 2020-02-13 DIAGNOSIS — E119 Type 2 diabetes mellitus without complications: Secondary | ICD-10-CM | POA: Diagnosis not present

## 2020-02-13 MED ORDER — DICYCLOMINE HCL 20 MG PO TABS: 20.0000 mg | ORAL_TABLET | Freq: Three times a day (TID) | ORAL | 5 refills | Status: DC | PRN

## 2020-02-13 NOTE — Progress Notes (Signed)
Established patient visit   Patient: Kristin Chavez   DOB: Jun 02, 1971   49 y.o. Female  MRN: [id number] Visit Date: 02/13/2020  Today's healthcare provider: Mar Daring, PA-C   Chief Complaint  Patient presents with  . Diabetes  . Hypothyroidism   I,Latasha Walston,acting as a scribe for [employer], PA-C.,have documented all relevant documentation on the behalf of Mar Daring, PA-C,as directed by  Mar Daring, PA-C while in the presence of Mar Daring, PA-C.  Subjective    HPI  Diabetes Mellitus Type II, Follow-up  Lab Results  Component Value Date   HGBA1C 6.5 (H) 02/13/2020   HGBA1C 7.4 (H) 08/28/2019   HGBA1C 6.7 (H) 02/12/2018   Last seen for diabetes 5 months ago.  Management since then includes advised to stop Humalog the week of 09/17/19. Check BS once daily. Stop Lantus next Monday (09/23/19). Continue Metformin '500mg'$  BID. She reports good compliance with treatment. She is not having side effects.  Symptoms: No fatigue No foot ulcerations No appetite changes No nausea No paresthesia (numbness or tingling) of the feet  No polydipsia (excessive thirst) No polyuria (frequent urination) No visual disturbances  No vomiting  Home blood sugar records: fasting range: checking once a week. averaging 130's   Episodes of hypoglycemia? No    Current insulin regiment: none Most Recent Eye Exam: not UTD Current exercise: none Current diet habits: keto diet  Pertinent Labs: Lab Results  Component Value Date   CHOL 189 02/12/2018   HDL 59 02/12/2018   LDLCALC 105 (H) 02/12/2018   TRIG 123 02/12/2018   CHOLHDL 3.2 02/12/2018   Lab Results  Component Value Date   NA 138 02/13/2020   K 3.9 02/13/2020   CO2 25 02/13/2020   GLUCOSE 102 (H) 02/13/2020   BUN 12 02/13/2020   CREATININE 0.82 02/13/2020   CALCIUM 9.8 02/13/2020   GFRNONAA 84 02/13/2020   GFRAA 97 02/13/2020     Wt Readings from Last 3 Encounters:    02/13/20 295 lb 6.4 oz (134 kg)  08/27/19 267 lb 10.2 oz (121.4 kg)  08/26/19 295 lb (133.8 kg)    --------------------------------------------------------------------------------------------------- Hypothyroid, follow-up  Lab Results  Component Value Date   TSH 19.500 (H) 02/13/2020   TSH 7.174 (H) 08/28/2019   TSH 21.790 (H) 02/12/2018   FREET4 0.52 (L) 08/28/2019   T4TOTAL 4.9 02/13/2020   Wt Readings from Last 3 Encounters:  02/13/20 295 lb 6.4 oz (134 kg)  08/27/19 267 lb 10.2 oz (121.4 kg)  08/26/19 295 lb (133.8 kg)    She was last seen for hypothyroid 5 months ago.  Management since that visit includes no change. She reports good compliance with treatment. She is not having side effects.   Symptoms: No change in energy level No constipation No diarrhea No heat / cold intolerance No nervousness No palpitations No weight changes  -----------------------------------------------------------------------------------------   Depression screen [facility] 2/9 02/13/2020  Decreased Interest 0  Down, Depressed, Hopeless 0  PHQ - 2 Score 0    -----------------------------------------------------------------------------------------       Medications: Outpatient Medications Prior to Visit  Medication Sig  . acetaminophen (TYLENOL) 500 MG tablet Take 500 mg by mouth every 6 (six) hours as needed for mild pain or fever.  Marland Kitchen albuterol (VENTOLIN HFA) 108 (90 Base) MCG/ACT inhaler Inhale 2 puffs into the lungs every 6 (six) hours as needed for wheezing or shortness of breath.  . blood glucose meter kit and  supplies Dispense based on patient and insurance preference. Use up to four times daily as directed. (FOR ICD-10 E10.9, E11.9).  . [DISCONTINUED] dicyclomine (BENTYL) 20 MG tablet Take 20 mg by mouth 3 (three) times daily as needed (for cramps).  . [DISCONTINUED] levothyroxine (SYNTHROID) 50 MCG tablet Take 1 tablet (50 mcg total) by mouth daily before breakfast.  .  [DISCONTINUED] metFORMIN (GLUCOPHAGE) 500 MG tablet TAKE 1 TABLET (500 MG TOTAL) BY MOUTH 2 (TWO) TIMES DAILY WITH A MEAL.  . [DISCONTINUED] ferrous sulfate 325 (65 FE) MG tablet TAKE 1 TABLET (325 MG TOTAL) BY MOUTH 2 (TWO) TIMES DAILY WITH A MEAL.   No facility-administered medications prior to visit.    Review of Systems  Constitutional: Negative.   Respiratory: Negative.   Cardiovascular: Negative.   Endocrine: Negative.   Musculoskeletal: Negative.     Last CBC Lab Results  Component Value Date   WBC 8.3 02/13/2020   HGB 12.0 02/13/2020   HCT 36.9 02/13/2020   MCV 89 02/13/2020   MCH 28.8 02/13/2020   RDW 14.4 02/13/2020   PLT 307 86/75/4492   Last metabolic panel Lab Results  Component Value Date   GLUCOSE 102 (H) 02/13/2020   NA 138 02/13/2020   K 3.9 02/13/2020   CL 101 02/13/2020   CO2 25 02/13/2020   BUN 12 02/13/2020   CREATININE 0.82 02/13/2020   GFRNONAA 84 02/13/2020   GFRAA 97 02/13/2020   CALCIUM 9.8 02/13/2020   PROT 6.8 02/13/2020   ALBUMIN 4.3 02/13/2020   LABGLOB 2.5 02/13/2020   AGRATIO 1.7 02/13/2020   BILITOT 0.4 02/13/2020   ALKPHOS 49 02/13/2020   AST 18 02/13/2020   ALT 15 02/13/2020   ANIONGAP 11 08/31/2019      Objective    BP (!) 149/91 (BP Location: Left Arm, Patient Position: Sitting, Cuff Size: Large)   Pulse 79   Temp (!) 97.1 F (36.2 C) (Temporal)   Wt 295 lb 6.4 oz (134 kg)   BMI 47.68 kg/m  BP Readings from Last 3 Encounters:  02/13/20 (!) 149/91  08/31/19 (!) 160/95  08/27/19 136/81   Wt Readings from Last 3 Encounters:  02/13/20 295 lb 6.4 oz (134 kg)  08/27/19 267 lb 10.2 oz (121.4 kg)  08/26/19 295 lb (133.8 kg)      Physical Exam Vitals reviewed.  Constitutional:      General: She is not in acute distress.    Appearance: Normal appearance. She is well-developed. She is obese. She is not ill-appearing or diaphoretic.  Neck:     Thyroid: No thyromegaly.     Vascular: No JVD.     Trachea: No tracheal  deviation.  Cardiovascular:     Rate and Rhythm: Normal rate and regular rhythm.     Pulses: Normal pulses.     Heart sounds: Normal heart sounds. No murmur. No friction rub. No gallop.   Pulmonary:     Effort: Pulmonary effort is normal. No respiratory distress.     Breath sounds: Normal breath sounds. No wheezing or rales.  Musculoskeletal:     Cervical back: Normal range of motion and neck supple.     Right lower leg: No edema.     Left lower leg: No edema.  Lymphadenopathy:     Cervical: No cervical adenopathy.  Neurological:     Mental Status: She is alert.      Results for orders placed or performed in visit on 02/13/20  CBC w/Diff/Platelet  Result Value Ref  Range   WBC 8.3 3.4 - 10.8 x10E3/uL   RBC 4.16 3.77 - 5.28 x10E6/uL   Hemoglobin 12.0 11.1 - 15.9 g/dL   Hematocrit 36.9 34.0 - 46.6 %   MCV 89 79 - 97 fL   MCH 28.8 26.6 - 33.0 pg   MCHC 32.5 31.5 - 35.7 g/dL   RDW 14.4 11.7 - 15.4 %   Platelets 307 150 - 450 x10E3/uL   Neutrophils 65 Not Estab. %   Lymphs 23 Not Estab. %   Monocytes 7 Not Estab. %   Eos 4 Not Estab. %   Basos 1 Not Estab. %   Neutrophils Absolute 5.4 1.4 - 7.0 x10E3/uL   Lymphocytes Absolute 1.9 0.7 - 3.1 x10E3/uL   Monocytes Absolute 0.5 0.1 - 0.9 x10E3/uL   EOS (ABSOLUTE) 0.4 0.0 - 0.4 x10E3/uL   Basophils Absolute 0.1 0.0 - 0.2 x10E3/uL   Immature Granulocytes 0 Not Estab. %   Immature Grans (Abs) 0.0 0.0 - 0.1 x10E3/uL  Comprehensive Metabolic Panel (CMET)  Result Value Ref Range   Glucose 102 (H) 65 - 99 mg/dL   BUN 12 6 - 24 mg/dL   Creatinine, Ser 0.82 0.57 - 1.00 mg/dL   GFR calc non Af Amer 84 >59 mL/min/1.73   GFR calc Af Amer 97 >59 mL/min/1.73   BUN/Creatinine Ratio 15 9 - 23   Sodium 138 134 - 144 mmol/L   Potassium 3.9 3.5 - 5.2 mmol/L   Chloride 101 96 - 106 mmol/L   CO2 25 20 - 29 mmol/L   Calcium 9.8 8.7 - 10.2 mg/dL   Total Protein 6.8 6.0 - 8.5 g/dL   Albumin 4.3 3.8 - 4.8 g/dL   Globulin, Total 2.5 1.5 - 4.5  g/dL   Albumin/Globulin Ratio 1.7 1.2 - 2.2   Bilirubin Total 0.4 0.0 - 1.2 mg/dL   Alkaline Phosphatase 49 39 - 117 IU/L   AST 18 0 - 40 IU/L   ALT 15 0 - 32 IU/L  T4 AND TSH  Result Value Ref Range   TSH 19.500 (H) 0.450 - 4.500 uIU/mL   T4, Total 4.9 4.5 - 12.0 ug/dL  HgB A1c  Result Value Ref Range   Hgb A1c MFr Bld 6.5 (H) 4.8 - 5.6 %   Est. average glucose Bld gHb Est-mCnc 140 mg/dL  Fe+TIBC+Fer  Result Value Ref Range   Total Iron Binding Capacity 374 250 - 450 ug/dL   UIBC 272 131 - 425 ug/dL   Iron 102 27 - 159 ug/dL   Iron Saturation 27 15 - 55 %   Ferritin 25 15 - 150 ng/mL  Vitamin D (25 hydroxy)  Result Value Ref Range   Vit D, 25-Hydroxy 18.5 (L) 30.0 - 100.0 ng/mL    Assessment & Plan     1. Adult hypothyroidism Stable. Continue Levothyroxine 67mg. Will check labs as below and f/u pending results. - CBC w/Diff/Platelet - T4 AND TSH  2. Type 2 diabetes mellitus without complication, without long-term current use of insulin (HCC) Stable. Continue metformin '500mg'$  daily. Referral for medical nutrition for weight loss provided.  - CBC w/Diff/Platelet - Comprehensive Metabolic Panel (CMET) - HgB A1c - Amb ref to Medical Nutrition Therapy-MNT  3. Obesity, Class III, BMI 40-49.9 (morbid obesity) ([place] Counseled patient on healthy lifestyle modifications including dieting and exercise.  Referral to MNT placed below.  - CBC w/Diff/Platelet - Comprehensive Metabolic Panel (CMET) - HgB A1c - Amb ref to Medical Nutrition Therapy-MNT  4. Avitaminosis  D H/O this. Will check labs as below and f/u pending results. High dose Vit D provided.  - CBC w/Diff/Platelet - Comprehensive Metabolic Panel (CMET) - Vitamin D (25 hydroxy) - Vitamin D, Ergocalciferol, (DRISDOL) 1.25 MG (50000 UNIT) CAPS capsule; Take 1 capsule (50,000 Units total) by mouth every 7 (seven) days.  Dispense: 12 capsule; Refill: 0  5. Iron deficiency anemia secondary to inadequate dietary iron  intake History of this. Will check labs as below and f/u pending results. - CBC w/Diff/Platelet - Comprehensive Metabolic Panel (CMET) - Fe+TIBC+Fer  6. Essential hypertension Elevated today. Will monitor at home. If still elevated will start BP medications.  - CBC w/Diff/Platelet - Comprehensive Metabolic Panel (CMET) - HgB A1c - Amb ref to Medical Nutrition Therapy-MNT  7. Irritable bowel syndrome with diarrhea Stable. Diagnosis pulled for medication refill. Continue current medical treatment plan. - dicyclomine (BENTYL) 20 MG tablet; Take 1 tablet (20 mg total) by mouth 3 (three) times daily as needed (for cramps).  Dispense: 90 tablet; Refill: 5   Return in about 3 months (around 05/15/2020) for HTN.      Reynolds Bowl, PA-C, have reviewed all documentation for this visit. The documentation on 02/17/20 for the exam, diagnosis, procedures, and orders are all accurate and complete.   Rubye Beach  [facility] [phone] (phone) [phone] (fax)  [place]

## 2020-02-13 NOTE — Patient Instructions (Signed)

## 2020-02-14 ENCOUNTER — Telehealth: Payer: Self-pay

## 2020-02-14 ENCOUNTER — Encounter: Payer: Self-pay | Admitting: Physician Assistant

## 2020-02-14 DIAGNOSIS — E034 Atrophy of thyroid (acquired): Secondary | ICD-10-CM

## 2020-02-14 LAB — COMPREHENSIVE METABOLIC PANEL
ALT: 15 IU/L (ref 0–32)
AST: 18 IU/L (ref 0–40)
Albumin/Globulin Ratio: 1.7 (ref 1.2–2.2)
Albumin: 4.3 g/dL (ref 3.8–4.8)
Alkaline Phosphatase: 49 IU/L (ref 39–117)
BUN/Creatinine Ratio: 15 (ref 9–23)
BUN: 12 mg/dL (ref 6–24)
Bilirubin Total: 0.4 mg/dL (ref 0.0–1.2)
CO2: 25 mmol/L (ref 20–29)
Calcium: 9.8 mg/dL (ref 8.7–10.2)
Chloride: 101 mmol/L (ref 96–106)
Creatinine, Ser: 0.82 mg/dL (ref 0.57–1.00)
GFR calc Af Amer: 97 mL/min/{1.73_m2} (ref >59)
GFR calc non Af Amer: 84 mL/min/{1.73_m2} (ref >59)
Globulin, Total: 2.5 g/dL (ref 1.5–4.5)
Glucose: 102 mg/dL — ABNORMAL HIGH (ref 65–99)
Potassium: 3.9 mmol/L (ref 3.5–5.2)
Sodium: 138 mmol/L (ref 134–144)
Total Protein: 6.8 g/dL (ref 6.0–8.5)

## 2020-02-14 LAB — IRON,TIBC AND FERRITIN PANEL
Ferritin: 25 ng/mL (ref 15–150)
Iron Saturation: 27 % (ref 15–55)
Iron: 102 ug/dL (ref 27–159)
Total Iron Binding Capacity: 374 ug/dL (ref 250–450)
UIBC: 272 ug/dL (ref 131–425)

## 2020-02-14 LAB — CBC WITH DIFFERENTIAL/PLATELET
Basophils Absolute: 0.1 10*3/uL (ref 0.0–0.2)
Basos: 1 %
EOS (ABSOLUTE): 0.4 10*3/uL (ref 0.0–0.4)
Eos: 4 %
Hematocrit: 36.9 % (ref 34.0–46.6)
Hemoglobin: 12 g/dL (ref 11.1–15.9)
Immature Grans (Abs): 0 10*3/uL (ref 0.0–0.1)
Immature Granulocytes: 0 %
Lymphocytes Absolute: 1.9 10*3/uL (ref 0.7–3.1)
Lymphs: 23 %
MCH: 28.8 pg (ref 26.6–33.0)
MCHC: 32.5 g/dL (ref 31.5–35.7)
MCV: 89 fL (ref 79–97)
Monocytes Absolute: 0.5 10*3/uL (ref 0.1–0.9)
Monocytes: 7 %
Neutrophils Absolute: 5.4 10*3/uL (ref 1.4–7.0)
Neutrophils: 65 %
Platelets: 307 10*3/uL (ref 150–450)
RBC: 4.16 x10E6/uL (ref 3.77–5.28)
RDW: 14.4 % (ref 11.7–15.4)
WBC: 8.3 10*3/uL (ref 3.4–10.8)

## 2020-02-14 LAB — VITAMIN D 25 HYDROXY (VIT D DEFICIENCY, FRACTURES): Vit D, 25-Hydroxy: 18.5 ng/mL — ABNORMAL LOW (ref 30.0–100.0)

## 2020-02-14 LAB — T4 AND TSH
T4, Total: 4.9 ug/dL (ref 4.5–12.0)
TSH: 19.5 u[IU]/mL — ABNORMAL HIGH (ref 0.450–4.500)

## 2020-02-14 LAB — HEMOGLOBIN A1C
Est. average glucose Bld gHb Est-mCnc: 140 mg/dL
Hgb A1c MFr Bld: 6.5 % — ABNORMAL HIGH (ref 4.8–5.6)

## 2020-02-14 MED ORDER — VITAMIN D (ERGOCALCIFEROL) 1.25 MG (50000 UNIT) PO CAPS: 50000.0000 [IU] | ORAL_CAPSULE | ORAL | 0 refills | Status: DC

## 2020-02-14 MED ORDER — LEVOTHYROXINE SODIUM 75 MCG PO TABS: 75.0000 ug | ORAL_TABLET | Freq: Every day | ORAL | 3 refills | Status: DC

## 2020-02-14 MED ORDER — METFORMIN HCL 500 MG PO TABS: 500.0000 mg | ORAL_TABLET | Freq: Every day | ORAL | 1 refills | Status: DC

## 2020-02-14 NOTE — Telephone Encounter (Signed)
Pt reviewed lab results.  See mychart message 02/14/2020  Thanks,   -Vernona Rieger

## 2020-02-14 NOTE — Telephone Encounter (Signed)
-----   Message from Margaretann Loveless, [place] sent at 02/14/2020  9:15 AM EDT ----- Blood count is now normal. Kidney and liver function are normal. Sodium, potassium and calcium are normal. Thyroid is under-corrected. Would recommend to increase levothyroxine to from if you are taking the daily. A1c is much improved and down to 6.5 from 7.4. Iron stores are normal as well. Vit D is low. Recommend high dose Vit D once weekly x 12 weeks then transition to OTC Vit D 1000-2000 IU daily.

## 2020-02-26 ENCOUNTER — Encounter: Payer: Self-pay | Admitting: Physician Assistant

## 2020-02-26 DIAGNOSIS — I152 Hypertension secondary to endocrine disorders: Secondary | ICD-10-CM | POA: Insufficient documentation

## 2020-02-26 DIAGNOSIS — E1159 Type 2 diabetes mellitus with other circulatory complications: Secondary | ICD-10-CM | POA: Insufficient documentation

## 2020-02-26 MED ORDER — LOSARTAN POTASSIUM 50 MG PO TABS: 50.0000 mg | ORAL_TABLET | Freq: Every day | ORAL | 0 refills | Status: DC

## 2020-02-26 NOTE — Telephone Encounter (Signed)
Sent her in Losartan 50 mg QD. This is the first choice BP medication we would pick for somebody who has diabetes. Needs to be monitored with labwork, can sometimes have a dry cough as side effect. There is a rare allergic reaction where lips and tongue can swell. If this happens, she should stop taking medication and seek emergent care. Please schedule follow up with Antony Contras in one month for BP recheck and labwork.

## 2020-02-26 NOTE — Addendum Note (Signed)
Addended by: Trey Sailors on: 02/26/2020 11:23 AM   Modules accepted: Orders

## 2020-02-27 ENCOUNTER — Encounter: Payer: 59 | Attending: Physician Assistant | Admitting: *Deleted

## 2020-02-27 ENCOUNTER — Other Ambulatory Visit: Payer: Self-pay

## 2020-02-27 ENCOUNTER — Encounter: Payer: Self-pay | Admitting: *Deleted

## 2020-02-27 VITALS — BP 160/90 | Ht 65.0 in | Wt 295.3 lb

## 2020-02-27 DIAGNOSIS — Z713 Dietary counseling and surveillance: Secondary | ICD-10-CM | POA: Diagnosis not present

## 2020-02-27 DIAGNOSIS — E119 Type 2 diabetes mellitus without complications: Secondary | ICD-10-CM | POA: Diagnosis not present

## 2020-02-27 DIAGNOSIS — I10 Essential (primary) hypertension: Secondary | ICD-10-CM | POA: Insufficient documentation

## 2020-02-27 DIAGNOSIS — Z6841 Body Mass Index (BMI) 40.0 and over, adult: Secondary | ICD-10-CM | POA: Diagnosis not present

## 2020-02-27 NOTE — Patient Instructions (Signed)
Check blood sugars before breakfast or 2 hrs after one meal - 3 x week Bring blood sugar records to the next class  Exercise: Begin walking  for 15  minutes  3  days a week and gradually increase to 30 minutes 5 x week  Eat 3 meals day,  1-2  snacks a day Space meals 4-6 hours apart  Make an eye doctor appointment  Return for classes on:

## 2020-02-28 NOTE — Progress Notes (Signed)
Diabetes Self-Management Education  Visit Type: First/Initial  Appt. Start Time:1535  Appt. End Time: 3790  02/27/2020  Kristin Chavez, identified by name and date of birth, is a 49 y.o. female with a diagnosis of Diabetes: Type 2.   ASSESSMENT  Blood pressure (!) 160/90, height 5\' 5"  (1.651 m), weight 295 lb 4.8 oz (133.9 kg). Body mass index is 49.14 kg/m.  Diabetes Self-Management Education - 02/27/20 1656      Visit Information   Visit Type  First/Initial      Initial Visit   Diabetes Type  Type 2    Are you currently following a meal plan?  Yes    What type of meal plan do you follow?  "keto, lower carb diet"    Are you taking your medications as prescribed?  Yes    Date Diagnosed  6 months ago      Health Coping   How would you rate your overall health?  Fair      Psychosocial Assessment   Patient Belief/Attitude about Diabetes  Motivated to manage diabetes   "not happy"   Self-care barriers  None    Self-management support  Doctor's office;Family    Patient Concerns  Nutrition/Meal planning;Medication;Monitoring;Healthy Lifestyle;Glycemic Control;Weight Control    Special Needs  None    Preferred Learning Style  Auditory;Visual    Learning Readiness  Change in progress    How often do you need to have someone help you when you read instructions, pamphlets, or other written materials from your doctor or pharmacy?  1 - Never    What is the last grade level you completed in school?  2 year college      Pre-Education Assessment   Patient understands the diabetes disease and treatment process.  Needs Instruction    Patient understands incorporating nutritional management into lifestyle.  Needs Review    Patient undertands incorporating physical activity into lifestyle.  Needs Instruction    Patient understands using medications safely.  Needs Instruction    Patient understands monitoring blood glucose, interpreting and using results  Needs Review    Patient  understands prevention, detection, and treatment of acute complications.  Needs Instruction    Patient understands prevention, detection, and treatment of chronic complications.  Needs Review    Patient understands how to develop strategies to address psychosocial issues.  Needs Instruction    Patient understands how to develop strategies to promote health/change behavior.  Needs Instruction      Complications   Last HgB A1C per patient/outside source  6.5 %   02/13/2020   How often do you check your blood sugar?  3-4 times / week   pt reports only checking weekly   Fasting Blood glucose range (mg/dL)  130-179   She reports FBG's in the 130's mg/dL.   Have you had a dilated eye exam in the past 12 months?  No    Have you had a dental exam in the past 12 months?  Yes    Are you checking your feet?  Yes    How many days per week are you checking your feet?  4      Dietary Intake   Breakfast  yogurt, chia seeds, granola, berries; eggs, sausage, peppers    Snack (morning)  cheese almonds, pepperoni, gramps, banana    Lunch  broccoli salad with monk fruit, avocado, ham, cranberries, cheese, sunflower seeds; left overs    Snack (afternoon)  same    [employer],  beef, occasional fish and pork; peas, beans, corn, sweet potatoes, cauliflower rice, occasional pasta, lettuce, tomatoes, carrots, cuccumbers, celery, squash, okra    Beverage(s)  water, sugar free drinks      Exercise   Exercise Type  ADL's      Patient Education   Previous Diabetes Education  No    Disease state   Definition of diabetes, type 1 and 2, and the diagnosis of diabetes;Factors that contribute to the development of diabetes    Nutrition management   Role of diet in the treatment of diabetes and the relationship between the three main macronutrients and blood glucose level;Food label reading, portion sizes and measuring food.;Carbohydrate counting;Reviewed blood glucose goals for pre and post meals and how to evaluate  the patients' food intake on their blood glucose level.;Meal timing in regards to the patients' current diabetes medication.    Physical activity and exercise   Role of exercise on diabetes management, blood pressure control and cardiac health.    Medications  Reviewed patients medication for diabetes, action, purpose, timing of dose and side effects.    Monitoring  Purpose and frequency of SMBG.;Taught/discussed recording of test results and interpretation of SMBG.;Identified appropriate SMBG and/or A1C goals.;Yearly dilated eye exam    Chronic complications  Relationship between chronic complications and blood glucose control    Psychosocial adjustment  Identified and addressed patients feelings and concerns about diabetes      Individualized Goals (developed by patient)   Reducing Risk  Other (comment)   improve blood sugars, decrease medications, prevent diabetes complications, lose weight, lead a healthier lifestyle     Outcomes   Expected Outcomes  Demonstrated interest in learning. Expect positive outcomes    Future DMSE  2 wks       Individualized Plan for Diabetes Self-Management Training:   Learning Objective:  Patient will have a greater understanding of diabetes self-management. Patient education plan is to attend individual and/or group sessions per assessed needs and concerns.   Plan:   Patient Instructions  Check blood sugars before breakfast or 2 hrs after one meal - 3 x week Bring blood sugar records to the next class Exercise: Begin walking  for 15  minutes  3  days a week and gradually increase to 30 minutes 5 x week Eat 3 meals day,  1-2  snacks a day Space meals 4-6 hours apart Make an eye doctor appointment  Expected Outcomes:  Demonstrated interest in learning. Expect positive outcomes  Education material provided:  General Meal Planning Guidelines Simple Meal Plan  If problems or questions, patient to contact team via:  Sharion Settler, RN, CCM, CDCES  [phone]  Future DSME appointment: 2 wks  March 23, 2020 for Diabetes Class 1

## 2020-03-21 ENCOUNTER — Other Ambulatory Visit: Payer: Self-pay | Admitting: Physician Assistant

## 2020-03-21 DIAGNOSIS — E034 Atrophy of thyroid (acquired): Secondary | ICD-10-CM

## 2020-03-22 ENCOUNTER — Telehealth: Payer: 59 | Admitting: Physician Assistant

## 2020-03-22 DIAGNOSIS — H44001 Unspecified purulent endophthalmitis, right eye: Secondary | ICD-10-CM

## 2020-03-22 MED ORDER — MUPIROCIN 2 % EX OINT: 1.0000 "application " | TOPICAL_OINTMENT | Freq: Three times a day (TID) | CUTANEOUS | 0 refills | Status: DC

## 2020-03-22 NOTE — Progress Notes (Signed)
We are sorry that you are not feeling well. Here is how we plan to help!  Based on what you have shared with me it looks like you have a stye.  A stye is an inflammation of the eyelid.  It is often a red, painful lump near the edge of the eyelid that may look like a boil or a pimple.  A stye develops when an infection occurs at the base of an eyelash.   We have made appropriate suggestions for you based upon your presentation: Simple styes can be treated without medical intervention.  Most styes either resolve spontaneously or resolve with simple home treatment by applying warm compresses or heated washcloth to the stye for about 10-15 minutes three to four times a day. This causes the stye to drain and resolve. You can also apply topical antibiotic ointment to the area a few times a day. I sent Mupirocin to your pharmacy.   HOME CARE:   Wash your hands often!  Let the stye open on its own. Don't squeeze or open it.  Don't rub your eyes. This can irritate your eyes and let in bacteria.  If you need to touch your eyes, wash your hands first.  Don't wear eye makeup or contact lenses until the area has healed.  GET HELP RIGHT AWAY IF:   Your symptoms do not improve.  You develop blurred or loss of vision.  Your symptoms worsen (increased discharge, pain or redness).  Thank you for choosing an e-visit.  Your e-visit answers were reviewed by a board certified advanced clinical practitioner to complete your personal care plan.  Depending upon the condition, your plan could have included both over the counter or prescription medications.  Please review your pharmacy choice.  Make sure the pharmacy is open so you can pick up prescription now.  If there is a problem, you may contact your provider through [employer] and have the prescription routed to another pharmacy.    Your safety is important to [place].  If you have drug allergies check your prescription carefully.  For the next 24  hours you can use MyChart to ask questions about today's visit, request a non-urgent call back, or ask for a work or school excuse.  You will get an email in the next two days asking about your experience.  I hope you that your e-visit has been valuable and will speed your recovery.   Greater than 5 minutes, yet less than 10 minutes of time have been spent researching, coordinating and implementing care for this patient today.

## 2020-03-23 ENCOUNTER — Encounter: Payer: Self-pay | Admitting: Dietician

## 2020-03-23 ENCOUNTER — Other Ambulatory Visit: Payer: Self-pay

## 2020-03-23 ENCOUNTER — Encounter: Payer: 59 | Attending: Physician Assistant | Admitting: Dietician

## 2020-03-23 VITALS — Ht 65.0 in | Wt 291.5 lb

## 2020-03-23 DIAGNOSIS — Z713 Dietary counseling and surveillance: Secondary | ICD-10-CM | POA: Insufficient documentation

## 2020-03-23 DIAGNOSIS — E119 Type 2 diabetes mellitus without complications: Secondary | ICD-10-CM | POA: Diagnosis not present

## 2020-03-23 DIAGNOSIS — I10 Essential (primary) hypertension: Secondary | ICD-10-CM | POA: Insufficient documentation

## 2020-03-23 DIAGNOSIS — Z6841 Body Mass Index (BMI) 40.0 and over, adult: Secondary | ICD-10-CM | POA: Diagnosis not present

## 2020-03-23 NOTE — Progress Notes (Signed)

## 2020-03-30 ENCOUNTER — Encounter: Payer: 59 | Admitting: *Deleted

## 2020-03-30 ENCOUNTER — Encounter: Payer: Self-pay | Admitting: *Deleted

## 2020-03-30 ENCOUNTER — Other Ambulatory Visit: Payer: Self-pay

## 2020-03-30 ENCOUNTER — Telehealth: Payer: Self-pay

## 2020-03-30 VITALS — Wt 287.3 lb

## 2020-03-30 DIAGNOSIS — Z713 Dietary counseling and surveillance: Secondary | ICD-10-CM | POA: Diagnosis not present

## 2020-03-30 DIAGNOSIS — E119 Type 2 diabetes mellitus without complications: Secondary | ICD-10-CM

## 2020-03-30 NOTE — Progress Notes (Signed)

## 2020-03-30 NOTE — Telephone Encounter (Signed)
Called patient and scheduled a[ppointment with Antony Contras on next Monday,04/06/2020.

## 2020-04-06 ENCOUNTER — Ambulatory Visit: Payer: 59 | Admitting: Physician Assistant

## 2020-04-06 ENCOUNTER — Other Ambulatory Visit: Payer: Self-pay

## 2020-04-06 ENCOUNTER — Encounter: Payer: Self-pay | Admitting: Physician Assistant

## 2020-04-06 ENCOUNTER — Encounter: Payer: 59 | Admitting: Dietician

## 2020-04-06 ENCOUNTER — Encounter: Payer: Self-pay | Admitting: Dietician

## 2020-04-06 VITALS — BP 156/90 | Ht 65.0 in | Wt 285.6 lb

## 2020-04-06 VITALS — BP 146/82 | HR 73 | Temp 97.3°F | Resp 16 | Wt 286.0 lb

## 2020-04-06 DIAGNOSIS — E1159 Type 2 diabetes mellitus with other circulatory complications: Secondary | ICD-10-CM

## 2020-04-06 DIAGNOSIS — I10 Essential (primary) hypertension: Secondary | ICD-10-CM | POA: Diagnosis not present

## 2020-04-06 DIAGNOSIS — E034 Atrophy of thyroid (acquired): Secondary | ICD-10-CM | POA: Diagnosis not present

## 2020-04-06 DIAGNOSIS — E119 Type 2 diabetes mellitus without complications: Secondary | ICD-10-CM

## 2020-04-06 DIAGNOSIS — I152 Hypertension secondary to endocrine disorders: Secondary | ICD-10-CM

## 2020-04-06 DIAGNOSIS — Z713 Dietary counseling and surveillance: Secondary | ICD-10-CM | POA: Diagnosis not present

## 2020-04-06 MED ORDER — LOSARTAN POTASSIUM 50 MG PO TABS: 25.0000 mg | ORAL_TABLET | Freq: Every day | ORAL | 0 refills | Status: DC

## 2020-04-06 MED ORDER — HYDROCHLOROTHIAZIDE 25 MG PO TABS: 25.0000 mg | ORAL_TABLET | Freq: Every day | ORAL | 3 refills | Status: DC

## 2020-04-06 NOTE — Progress Notes (Signed)

## 2020-04-06 NOTE — Patient Instructions (Signed)
Hydrochlorothiazide, HCTZ Oral Capsules or Tablets What is this medicine? HYDROCHLOROTHIAZIDE (hye droe klor oh THYE a zide) is a diuretic. It helps you make more urine and to lose salt and excess water from your body. It treats swelling from heart, kidney, or liver disease. It also treats high blood pressure. This medicine may be used for other purposes; ask your health care provider or pharmacist if you have questions. COMMON BRAND NAME(S): Esidrix, Ezide, HydroDIURIL, Microzide, Oretic, Zide What should I tell my health care provider before I take this medicine? They need to know if you have any of these conditions:  diabetes  gout  immune system problems, like lupus  kidney disease or kidney stones  liver disease  pancreatitis  small amount of urine or difficulty passing urine  an unusual or allergic reaction to hydrochlorothiazide, sulfa drugs, other medicines, foods, dyes, or preservatives  pregnant or trying to get pregnant  breast-feeding How should I use this medicine? Take this drug by mouth. Take it as directed on the prescription label at the same time every day. You can take it with or without food. If it upsets your stomach, take it with food. Keep taking it unless your health care provider tells you to stop. Talk to your health care provider about the use of this drug in children. While it may be prescribed for children as young as newborns for selected conditions, precautions do apply. Overdosage: If you think you have taken too much of this medicine contact a poison control center or emergency room at once. NOTE: This medicine is only for you. Do not share this medicine with others. What if I miss a dose? If you miss a dose, take it as soon as you can. If it is almost time for your next dose, take only that dose. Do not take double or extra doses. What may interact with this medicine?  cholestyramine  colestipol  digoxin  dofetilide  lithium  medicines  for blood pressure  medicines for diabetes  medicines that relax muscles for surgery  other diuretics  steroid medicines like prednisone or cortisone This list may not describe all possible interactions. Give your health care provider a list of all the medicines, herbs, non-prescription drugs, or dietary supplements you use. Also tell them if you smoke, drink alcohol, or use illegal drugs. Some items may interact with your medicine. What should I watch for while using this medicine? Visit your doctor or health care professional for regular checks on your progress. Check your blood pressure as directed. Ask your doctor or health care professional what your blood pressure should be and when you should contact him or her. Talk to your health care professional about your risk of skin cancer. You may be more at risk for skin cancer if you take this medicine. This medicine can make you more sensitive to the sun. Keep out of the sun. If you cannot avoid being in the sun, wear protective clothing and use sunscreen. Do not use sun lamps or tanning beds/booths. You may need to be on a special diet while taking this medicine. Ask your doctor. Check with your doctor or health care professional if you get an attack of severe diarrhea, nausea and vomiting, or if you sweat a lot. The loss of too much body fluid can make it dangerous for you to take this medicine. You may get drowsy or dizzy. Do not drive, use machinery, or do anything that needs mental alertness until you know how this  medicine affects you. Do not stand or sit up quickly, especially if you are an older patient. This reduces the risk of dizzy or fainting spells. Alcohol may interfere with the effect of this medicine. Avoid alcoholic drinks. This medicine may increase blood sugar. Ask your healthcare provider if changes in diet or medicines are needed if you have diabetes. What side effects may I notice from receiving this medicine? Side effects  that you should report to your doctor or health care professional as soon as possible:  allergic reactions such as skin rash or itching, hives, swelling of the lips, mouth, tongue, or throat  changes in vision  chest pain  eye pain  fast or irregular heartbeat  feeling faint or lightheaded, falls  gout attack  muscle pain or cramps  pain or difficulty when passing urine  pain, tingling, numbness in the hands or feet  redness, blistering, peeling or loosening of the skin, including inside the mouth   signs and symptoms of high blood sugar such as being more thirsty or hungry or having to urinate more than normal. You may also feel very tired or have blurry vision.  unusually weak Side effects that usually do not require medical attention (report to your doctor or health care professional if they continue or are bothersome):  change in sex drive or performance  dry mouth  headache  stomach upset This list may not describe all possible side effects. Call your doctor for medical advice about side effects. You may report side effects to FDA at 1-800-FDA-1088. Where should I keep my medicine? Keep out of the reach of children and pets. Store at room temperature between 20 and 25 degrees C (68 and 77 degrees F). Protect from light and moisture. Keep the container tightly closed. Do not freeze. Throw away any unused drug after the expiration date. NOTE: This sheet is a summary. It may not cover all possible information. If you have questions about this medicine, talk to your doctor, pharmacist, or health care provider.  2020 Elsevier/Gold Standard (2019-05-30 16:52:59)

## 2020-04-06 NOTE — Progress Notes (Signed)
Established patient visit   Patient: Kristin Chavez   DOB: [birth year]   49 y.o. Female  MRN: [id number] Visit Date: 04/06/2020  Today's healthcare provider: Mar Daring, PA-C   Chief Complaint  Patient presents with  . Follow-up    HTN and Thyroid   Subjective    HPI Patient here to follow up on HTN and thyroid. She was seem for this almost 2 months ago.   Hypothyroid, follow-up  Lab Results  Component Value Date   TSH 7.460 (H) 04/06/2020   TSH 19.500 (H) 02/13/2020   TSH 7.174 (H) 08/28/2019   FREET4 0.52 (L) 08/28/2019   T4TOTAL 5.8 04/06/2020   T4TOTAL 4.9 02/13/2020   Wt Readings from Last 3 Encounters:  04/06/20 285 lb 9.6 oz (129.5 kg)  04/06/20 286 lb (129.7 kg)  03/30/20 287 lb 4.8 oz (130.3 kg)    She was last seen for hypothyroid 2 months ago.   Management since that visit includes increased dose of levothyroxine to 57mg from 544m and we can recheck in 8 weeks or so.  She reports excellent compliance with treatment. She is not having side effects.   Symptoms: No change in energy level No constipation  No diarrhea No heat / cold intolerance  No nervousness No palpitations  No weight changes    ----------------------------------------------------------------------------------------- Hypertension, follow-up  BP Readings from Last 3 Encounters:  04/06/20 (!) 156/90  04/06/20 (!) 146/82  02/27/20 (!) 160/90   Wt Readings from Last 3 Encounters:  04/06/20 285 lb 9.6 oz (129.5 kg)  04/06/20 286 lb (129.7 kg)  03/30/20 287 lb 4.8 oz (130.3 kg)     She was last seen for hypertension 2 months ago.  BP at that visit was 149/91. Management since that visit includes Losartan 50 mg QD follow up with JeTawanna Satn one month for BP recheck and labwork.   She reports excellent compliance with treatment. She not sure if she is  having side effects. Reports that she gets dizzy when she lays down and try to get up. She has had a bug bite in her right  ear so she is not sure if it is coming from the ear or the medicine. She is following a Low fat diet. She is not exercising. She does not smoke.  Outside blood pressures are 140's/80's. She has a wrist blood pressure at home. Symptoms: No chest pain No chest pressure  No palpitations No syncope  No dyspnea No orthopnea  No paroxysmal nocturnal dyspnea No lower extremity edema   Pertinent labs: Lab Results  Component Value Date   CHOL 189 02/12/2018   HDL 59 02/12/2018   LDLCALC 105 (H) 02/12/2018   TRIG 123 02/12/2018   CHOLHDL 3.2 02/12/2018   Lab Results  Component Value Date   NA 138 02/13/2020   K 3.9 02/13/2020   CREATININE 0.82 02/13/2020   GFRNONAA 84 02/13/2020   GFRAA 97 02/13/2020   GLUCOSE 102 (H) 02/13/2020     The 10-year ASCVD risk score (GMikey BussingC Jr., et al., 2013) is: 3.7%   --------------------------------------------------------------------------------------------------- Patient Active Problem List   Diagnosis Date Noted  . Hypertension associated with diabetes ([facility]05/19/2021  . History of acute respiratory failure 08/27/2019  . History of COVID-19 08/27/2019  . Iron deficiency anemia secondary to inadequate dietary iron intake 02/15/2018  . Type 2 diabetes mellitus without complication, without long-term current use of insulin ([facility]/06/2018  . Genital warts 02/12/2018  . Polymorphous light  eruption 06/21/2016  . Snores 01/15/2016  . Avitaminosis D 01/15/2016  . Contusion of foot 01/11/2010  . Hay fever 07/28/2009  . Fatty infiltration of liver 02/27/2009  . Obesity, Class III, BMI 40-49.9 (morbid obesity) ([place]) 07/11/2008  . Adult hypothyroidism 05/10/2007   Past Medical History:  Diagnosis Date  . Diabetes mellitus without complication ([place])   . Hypertension   . Hypothyroidism   . Liver disease, chronic   . Thyroid disease   . Thyroid disease        Medications: Outpatient Medications Prior to Visit  Medication Sig  . blood  glucose meter kit and supplies Dispense based on patient and insurance preference. Use up to four times daily as directed. (FOR ICD-10 E10.9, E11.9).  Marland Kitchen dicyclomine (BENTYL) 20 MG tablet Take 1 tablet (20 mg total) by mouth 3 (three) times daily as needed (for cramps).  . metFORMIN (GLUCOPHAGE) 500 MG tablet Take 1 tablet (500 mg total) by mouth daily with breakfast.  . Vitamin D, Ergocalciferol, (DRISDOL) 1.25 MG (50000 UNIT) CAPS capsule Take 1 capsule (50,000 Units total) by mouth every 7 (seven) days.  . [DISCONTINUED] levothyroxine (SYNTHROID) 75 MCG tablet Take 1 tablet (75 mcg total) by mouth daily.  . [DISCONTINUED] losartan (COZAAR) 50 MG tablet Take 1 tablet (50 mg total) by mouth daily.  Marland Kitchen acetaminophen (TYLENOL) 500 MG tablet Take 500 mg by mouth every 6 (six) hours as needed for mild pain or fever. (Patient not taking: Reported on 04/06/2020)  . albuterol (VENTOLIN HFA) 108 (90 Base) MCG/ACT inhaler Inhale 2 puffs into the lungs every 6 (six) hours as needed for wheezing or shortness of breath. (Patient not taking: Reported on 02/27/2020)  . [DISCONTINUED] mupirocin ointment (BACTROBAN) 2 % Apply 1 application topically 3 (three) times daily.   No facility-administered medications prior to visit.    Review of Systems  Constitutional: Negative.   Eyes: Negative for visual disturbance.  Respiratory: Negative.   Cardiovascular: Negative.   Endocrine: Negative for cold intolerance and heat intolerance.  Neurological: Positive for dizziness.    Last CBC Lab Results  Component Value Date   WBC 8.3 02/13/2020   HGB 12.0 02/13/2020   HCT 36.9 02/13/2020   MCV 89 02/13/2020   MCH 28.8 02/13/2020   RDW 14.4 02/13/2020   PLT 307 32/09/2481   Last metabolic panel Lab Results  Component Value Date   GLUCOSE 102 (H) 02/13/2020   NA 138 02/13/2020   K 3.9 02/13/2020   CL 101 02/13/2020   CO2 25 02/13/2020   BUN 12 02/13/2020   CREATININE 0.82 02/13/2020   GFRNONAA 84 02/13/2020    GFRAA 97 02/13/2020   CALCIUM 9.8 02/13/2020   PROT 6.8 02/13/2020   ALBUMIN 4.3 02/13/2020   LABGLOB 2.5 02/13/2020   AGRATIO 1.7 02/13/2020   BILITOT 0.4 02/13/2020   ALKPHOS 49 02/13/2020   AST 18 02/13/2020   ALT 15 02/13/2020   ANIONGAP 11 08/31/2019   Last lipids Lab Results  Component Value Date   CHOL 189 02/12/2018   HDL 59 02/12/2018   LDLCALC 105 (H) 02/12/2018   TRIG 123 02/12/2018   CHOLHDL 3.2 02/12/2018      Objective    BP (!) 146/82 (BP Location: Left Arm, Patient Position: Sitting, Cuff Size: Large)   Pulse 73   Temp (!) 97.3 F (36.3 C) (Temporal)   Resp 16   Wt 286 lb (129.7 kg)   BMI 47.59 kg/m  BP Readings from Last 3 Encounters:  04/06/20 (!) 156/90  04/06/20 (!) 146/82  02/27/20 (!) 160/90   Wt Readings from Last 3 Encounters:  04/06/20 285 lb 9.6 oz (129.5 kg)  04/06/20 286 lb (129.7 kg)  03/30/20 287 lb 4.8 oz (130.3 kg)      Physical Exam Vitals reviewed.  Constitutional:      General: She is not in acute distress.    Appearance: Normal appearance. She is well-developed. She is obese. She is not ill-appearing or diaphoretic.  Neck:     Thyroid: No thyromegaly.     Vascular: No JVD.     Trachea: No tracheal deviation.  Cardiovascular:     Rate and Rhythm: Normal rate and regular rhythm.     Pulses: Normal pulses.     Heart sounds: Normal heart sounds. No murmur heard.  No friction rub. No gallop.   Pulmonary:     Effort: Pulmonary effort is normal. No respiratory distress.     Breath sounds: Normal breath sounds. No wheezing or rales.  Musculoskeletal:     Cervical back: Normal range of motion and neck supple.  Lymphadenopathy:     Cervical: No cervical adenopathy.  Neurological:     Mental Status: She is alert.      Results for orders placed or performed in visit on 04/06/20  T4 AND TSH  Result Value Ref Range   TSH 7.460 (H) 0.450 - 4.500 uIU/mL   T4, Total 5.8 4.5 - 12.0 ug/dL    Assessment & Plan     1.  Essential hypertension Decrease losartan to '25mg'$  as having positional dizziness with the '50mg'$ . Add HCTZ '25mg'$  as below. F/U in 4 weeks with BP check.  - hydrochlorothiazide (HYDRODIURIL) 25 MG tablet; Take 1 tablet (25 mg total) by mouth daily.  Dispense: 90 tablet; Refill: 3  2. Hypertension associated with diabetes ([place]) See above medical treatment plan. - losartan (COZAAR) 50 MG tablet; Take 0.5 tablets (25 mg total) by mouth daily.  Dispense: 90 tablet; Refill: 0  3. Hypothyroidism due to acquired atrophy of thyroid Will check labs as below and f/u pending results.  Thyroid was still under-corrected. Increase levothyroxine from 74mg to 830m as below.  - T4 AND TSH - levothyroxine (SYNTHROID) 88 MCG tablet; Take 1 tablet (88 mcg total) by mouth daily.  Dispense: 90 tablet; Refill: 0  4. Hypertension associated with diabetes ([facility] treatment [facility]. - losartan (COZAAR) 50 MG tablet; Take 0.5 tablets (25 mg total) by mouth daily.  Dispense: 90 tablet; Refill: 0   Return if symptoms worsen or fail to improve, for already has appt scheduled.      I,Reynolds BowlPA-C, have reviewed all documentation for this visit. The documentation on 04/07/20 for the exam, diagnosis, procedures, and orders are all accurate and complete.   JeRubye BeachBuBuchanan County Health [phone]) [phone])  [place]

## 2020-04-07 ENCOUNTER — Telehealth: Payer: Self-pay

## 2020-04-07 ENCOUNTER — Encounter: Payer: Self-pay | Admitting: *Deleted

## 2020-04-07 LAB — T4 AND TSH
T4, Total: 5.8 ug/dL (ref 4.5–12.0)
TSH: 7.46 u[IU]/mL — ABNORMAL HIGH (ref 0.450–4.500)

## 2020-04-07 MED ORDER — LEVOTHYROXINE SODIUM 88 MCG PO TABS: 88.0000 ug | ORAL_TABLET | Freq: Every day | ORAL | 0 refills | Status: DC

## 2020-04-07 NOTE — Telephone Encounter (Signed)
Thyroid is improving but still under-corrected. Would increase levothyroxine to from . Again recheck in 6-8 weeks (ok to recheck when you return in August).  Written by Margaretann Loveless, PA-C on 04/07/2020 12:00 PM EDT Seen by patient Kristin Chavez on 04/07/2020 12:01 PM

## 2020-04-07 NOTE — Telephone Encounter (Signed)
-----   Message from Margaretann Loveless, [place] sent at 04/07/2020 12:00 PM EDT ----- Thyroid is improving but still under-corrected. Would increase levothyroxine to from . Again recheck in 6-8 weeks (ok to recheck when you return in August).

## 2020-05-05 ENCOUNTER — Other Ambulatory Visit: Payer: Self-pay | Admitting: Physician Assistant

## 2020-05-05 DIAGNOSIS — E559 Vitamin D deficiency, unspecified: Secondary | ICD-10-CM

## 2020-05-05 NOTE — Telephone Encounter (Signed)
Requested medications are due for refill today?  Yes - Strengths of 50,000 IU cannot be delegated.    Requested medications are on active medication list?  Yes  Last Refill:   02/14/2020  # 12 with no refills  Future visit scheduled?  Yes in one week.    Notes to Clinic:  Strengths of 50,000 IU cannot be delegated.

## 2020-05-15 ENCOUNTER — Ambulatory Visit: Payer: 59 | Admitting: Physician Assistant

## 2020-05-15 ENCOUNTER — Encounter: Payer: Self-pay | Admitting: Physician Assistant

## 2020-05-15 ENCOUNTER — Other Ambulatory Visit: Payer: Self-pay

## 2020-05-15 VITALS — BP 138/89 | HR 71 | Temp 98.5°F | Resp 16 | Wt 283.2 lb

## 2020-05-15 DIAGNOSIS — E034 Atrophy of thyroid (acquired): Secondary | ICD-10-CM | POA: Diagnosis not present

## 2020-05-15 DIAGNOSIS — E119 Type 2 diabetes mellitus without complications: Secondary | ICD-10-CM

## 2020-05-15 LAB — POCT GLYCOSYLATED HEMOGLOBIN (HGB A1C)
Est. average glucose Bld gHb Est-mCnc: 134
Hemoglobin A1C: 6.3 % — AB (ref 4.0–5.6)

## 2020-05-15 NOTE — Progress Notes (Signed)
Established patient visit   Patient: Kristin Chavez   DOB: Sep 13, 1971   49 y.o. Female  MRN: [id number] Visit Date: 05/15/2020  Today's healthcare provider: Mar Daring, PA-C   Chief Complaint  Patient presents with   Follow-up   Subjective    HPI  Hypertension, follow-up  BP Readings from Last 3 Encounters:  05/15/20 138/89  04/06/20 (!) 156/90  04/06/20 (!) 146/82   Wt Readings from Last 3 Encounters:  05/15/20 283 lb 3.2 oz (128.5 kg)  04/06/20 285 lb 9.6 oz (129.5 kg)  04/06/20 286 lb (129.7 kg)     She was last seen for hypertension 4 weeks ago.  BP at that visit was 156/90. Management since that visit includes Decrease losartan to '25mg'$  as having positional dizziness with the '50mg'$ . Add HCTZ '25mg'$ .  Reports that she was still having dizziness, but now they are better.  Outside blood pressures are 130's-80's-70's  Symptoms: No chest pain No chest pressure  No palpitations No syncope  No dyspnea No orthopnea  No paroxysmal nocturnal dyspnea No lower extremity edema   Pertinent labs: Lab Results  Component Value Date   CHOL 189 02/12/2018   HDL 59 02/12/2018   LDLCALC 105 (H) 02/12/2018   TRIG 123 02/12/2018   CHOLHDL 3.2 02/12/2018   Lab Results  Component Value Date   NA 138 02/13/2020   K 3.9 02/13/2020   CREATININE 0.82 02/13/2020   GFRNONAA 84 02/13/2020   GFRAA 97 02/13/2020   GLUCOSE 102 (H) 02/13/2020     The 10-year ASCVD risk score Mikey Bussing DC Jr., et al., 2013) is: 2.9%  --------------------------------------------------------------------------------------------------- Diabetes Mellitus Type II, Follow-up  Lab Results  Component Value Date   HGBA1C 6.3 (A) 05/15/2020   HGBA1C 6.5 (H) 02/13/2020   HGBA1C 7.4 (H) 08/28/2019   Wt Readings from Last 3 Encounters:  05/15/20 283 lb 3.2 oz (128.5 kg)  04/06/20 285 lb 9.6 oz (129.5 kg)  04/06/20 286 lb (129.7 kg)   Last seen for diabetes 3 months ago.  Management since then  includes Stable. Continue metformin '500mg'$  daily. Referral for medical nutrition for weight loss provided. She reports excellent compliance with treatment. She is not having side effects.   Symptoms: No fatigue No foot ulcerations  No appetite changes No nausea  No paresthesia of the feet  No polydipsia  No polyuria No visual disturbances   No vomiting     Home blood sugar records: fasting range: 120's  Episodes of hypoglycemia? No    Current insulin regiment: none Current exercise: walking 2 miles a day. Diet right now is well balanced. She is done with the nutrition class.  Pertinent Labs: Lab Results  Component Value Date   CHOL 189 02/12/2018   HDL 59 02/12/2018   LDLCALC 105 (H) 02/12/2018   TRIG 123 02/12/2018   CHOLHDL 3.2 02/12/2018   Lab Results  Component Value Date   NA 138 02/13/2020   K 3.9 02/13/2020   CREATININE 0.82 02/13/2020   GFRNONAA 84 02/13/2020   GFRAA 97 02/13/2020   GLUCOSE 102 (H) 02/13/2020     --------------------------------------------------------------------------------------------------- Hypothyroid, follow-up Last labs showed still under-corrected. Levothyroxine increased to 24mg from 772m. Again recheck in 6-8 weeks (ok to recheck when you return in August). -----------------------------------------------------------------------------------------  Patient Active Problem List   Diagnosis Date Noted   Hypertension associated with diabetes ([facility]/19/20[place]   History of acute respiratory failure 08/27/2019   History of COVID-19 08/27/2019   Iron  deficiency anemia secondary to inadequate dietary iron intake 02/15/2018   Type 2 diabetes mellitus without complication, without long-term current use of insulin ([place]) 02/15/2018   Genital warts 02/12/2018   Polymorphous light eruption 06/21/2016   Snores 01/15/2016   Avitaminosis D 01/15/2016   Contusion of foot 01/11/2010   Hay fever 07/28/2009   Fatty infiltration of liver  02/27/2009   Obesity, Class III, BMI 40-49.9 (morbid obesity) ([place]) 07/11/2008   Adult hypothyroidism 05/10/2007   Past Medical History:  Diagnosis Date   Diabetes mellitus without complication ([place])    Hypertension    Hypothyroidism    Liver disease, chronic    Thyroid disease    Thyroid disease        Medications: Outpatient Medications Prior to Visit  Medication Sig   acetaminophen (TYLENOL) 500 MG tablet Take 500 mg by mouth every 6 (six) hours as needed for mild pain or fever.    blood glucose meter kit and supplies Dispense based on patient and insurance preference. Use up to four times daily as directed. (FOR ICD-10 E10.9, E11.9).   dicyclomine (BENTYL) 20 MG tablet Take 1 tablet (20 mg total) by mouth 3 (three) times daily as needed (for cramps).   hydrochlorothiazide (HYDRODIURIL) 25 MG tablet Take 1 tablet (25 mg total) by mouth daily.   levothyroxine (SYNTHROID) 88 MCG tablet Take 1 tablet (88 mcg total) by mouth daily.   losartan (COZAAR) 50 MG tablet Take 0.5 tablets (25 mg total) by mouth daily.   metFORMIN (GLUCOPHAGE) 500 MG tablet Take 1 tablet (500 mg total) by mouth daily with breakfast.   Vitamin D, Ergocalciferol, (DRISDOL) 1.25 MG (50000 UNIT) CAPS capsule TAKE 1 CAPSULE (50,000 UNITS TOTAL) BY MOUTH EVERY 7 (SEVEN) DAYS.   albuterol (VENTOLIN HFA) 108 (90 Base) MCG/ACT inhaler Inhale 2 puffs into the lungs every 6 (six) hours as needed for wheezing or shortness of breath. (Patient not taking: Reported on 02/27/2020)   No facility-administered medications prior to visit.    Review of Systems  Constitutional: Negative for appetite change, chills, fatigue and fever.  Respiratory: Negative for chest tightness and shortness of breath.   Cardiovascular: Negative for chest pain and palpitations.  Gastrointestinal: Negative for abdominal pain, nausea and vomiting.  Endocrine: Negative for polydipsia, polyphagia and polyuria.  Neurological: Negative  for dizziness and weakness.    Last CBC Lab Results  Component Value Date   WBC 8.3 02/13/2020   HGB 12.0 02/13/2020   HCT 36.9 02/13/2020   MCV 89 02/13/2020   MCH 28.8 02/13/2020   RDW 14.4 02/13/2020   PLT 307 82/42/3536   Last metabolic panel Lab Results  Component Value Date   GLUCOSE 102 (H) 02/13/2020   NA 138 02/13/2020   K 3.9 02/13/2020   CL 101 02/13/2020   CO2 25 02/13/2020   BUN 12 02/13/2020   CREATININE 0.82 02/13/2020   GFRNONAA 84 02/13/2020   GFRAA 97 02/13/2020   CALCIUM 9.8 02/13/2020   PROT 6.8 02/13/2020   ALBUMIN 4.3 02/13/2020   LABGLOB 2.5 02/13/2020   AGRATIO 1.7 02/13/2020   BILITOT 0.4 02/13/2020   ALKPHOS 49 02/13/2020   AST 18 02/13/2020   ALT 15 02/13/2020   ANIONGAP 11 08/31/2019      Objective    BP 138/89 (BP Location: Left Arm, Patient Position: Sitting, Cuff Size: Large)    Pulse 71    Temp 98.5 F (36.9 C) (Oral)    Resp 16    Wt 283 lb  3.2 oz (128.5 kg)    BMI 47.13 kg/m  BP Readings from Last 3 Encounters:  05/15/20 138/89  04/06/20 (!) 156/90  04/06/20 (!) 146/82   Wt Readings from Last 3 Encounters:  05/15/20 283 lb 3.2 oz (128.5 kg)  04/06/20 285 lb 9.6 oz (129.5 kg)  04/06/20 286 lb (129.7 kg)     Physical Exam Vitals reviewed.  Constitutional:      General: She is not in acute distress.    Appearance: Normal appearance. She is well-developed. She is obese. She is not ill-appearing or diaphoretic.  HENT:     Head: Normocephalic and atraumatic.     Right Ear: Hearing, tympanic membrane, ear canal and external ear normal.     Left Ear: Hearing, tympanic membrane, ear canal and external ear normal.     Nose: Nose normal.     Mouth/Throat:     Mouth: Mucous membranes are moist.     Pharynx: Oropharynx is clear. Uvula midline. No oropharyngeal exudate or posterior oropharyngeal erythema.  Eyes:     General: No scleral icterus.       Right eye: No discharge.        Left eye: No discharge.     Extraocular  Movements: Extraocular movements intact.     Conjunctiva/sclera: Conjunctivae normal.     Pupils: Pupils are equal, round, and reactive to light.  Neck:     Thyroid: No thyromegaly.     Trachea: No tracheal deviation.  Cardiovascular:     Rate and Rhythm: Normal rate and regular rhythm.     Pulses: Normal pulses.     Heart sounds: Normal heart sounds. No murmur heard.  No friction rub. No gallop.   Pulmonary:     Effort: Pulmonary effort is normal. No respiratory distress.     Breath sounds: Normal breath sounds. No stridor. No wheezing or rales.  Musculoskeletal:     Cervical back: Normal range of motion and neck supple.     Right lower leg: No edema.     Left lower leg: No edema.  Lymphadenopathy:     Cervical: No cervical adenopathy.  Skin:    General: Skin is warm and dry.  Neurological:     Mental Status: She is alert.       Results for orders placed or performed in visit on 05/15/20  POCT glycosylated hemoglobin (Hb A1C)  Result Value Ref Range   Hemoglobin A1C 6.3 (A) 4.0 - 5.6 %   Est. average glucose Bld gHb Est-mCnc 134     Assessment & Plan     1. Type 2 diabetes mellitus without complication, without long-term current use of insulin (HCC) A1c continues to improve. Down to 6.3 from 6.5. Patient continues to work on lifestyle modifications. She has lost 3 more pounds since last check. Patient will continue to take metformin '500mg'$  daily. Will recheck in 3 months.   2. Hypothyroidism due to acquired atrophy of thyroid Had been undercorrected last check. Levothyroxine was increased to 36mg at that time. Will recheck labs as below and f/u pending results.  - T4 AND TSH   No follow-ups on file.         JRubye Beach BKind[facility] [place] [phone](phone) [phone](fax)  CN[place]

## 2020-05-15 NOTE — Patient Instructions (Signed)
Diabetes Mellitus and Exercise Exercising regularly is important for your overall health, especially when you have diabetes (diabetes mellitus). Exercising is not only about losing weight. It has many other health benefits, such as increasing muscle strength and bone density and reducing body fat and stress. This leads to improved fitness, flexibility, and endurance, all of which result in better overall health. Exercise has additional benefits for people with diabetes, including:  Reducing appetite.  Helping to lower and control blood glucose.  Lowering blood pressure.  Helping to control amounts of fatty substances (lipids) in the blood, such as cholesterol and triglycerides.  Helping the body to respond better to insulin (improving insulin sensitivity).  Reducing how much insulin the body needs.  Decreasing the risk for heart disease by: ? Lowering cholesterol and triglyceride levels. ? Increasing the levels of good cholesterol. ? Lowering blood glucose levels. What is my activity plan? Your health care provider or certified diabetes educator can help you make a plan for the type and frequency of exercise (activity plan) that works for you. Make sure that you:  Do at least 150 minutes of moderate-intensity or vigorous-intensity exercise each week. This could be brisk walking, biking, or water aerobics. ? Do stretching and strength exercises, such as yoga or weightlifting, at least 2 times a week. ? Spread out your activity over at least 3 days of the week.  Get some form of physical activity every day. ? Do not go more than 2 days in a row without some kind of physical activity. ? Avoid being inactive for more than 30 minutes at a time. Take frequent breaks to walk or stretch.  Choose a type of exercise or activity that you enjoy, and set realistic goals.  Start slowly, and gradually increase the intensity of your exercise over time. What do I need to know about managing my  diabetes?   Check your blood glucose before and after exercising. ? If your blood glucose is 240 mg/dL (13.3 mmol/L) or higher before you exercise, check your urine for ketones. If you have ketones in your urine, do not exercise until your blood glucose returns to normal. ? If your blood glucose is 100 mg/dL (5.6 mmol/L) or lower, eat a snack containing 15-20 grams of carbohydrate. Check your blood glucose 15 minutes after the snack to make sure that your level is above 100 mg/dL (5.6 mmol/L) before you start your exercise.  Know the symptoms of low blood glucose (hypoglycemia) and how to treat it. Your risk for hypoglycemia increases during and after exercise. Common symptoms of hypoglycemia can include: ? Hunger. ? Anxiety. ? Sweating and feeling clammy. ? Confusion. ? Dizziness or feeling light-headed. ? Increased heart rate or palpitations. ? Blurry vision. ? Tingling or numbness around the mouth, lips, or tongue. ? Tremors or shakes. ? Irritability.  Keep a rapid-acting carbohydrate snack available before, during, and after exercise to help prevent or treat hypoglycemia.  Avoid injecting insulin into areas of the body that are going to be exercised. For example, avoid injecting insulin into: ? The arms, when playing tennis. ? The legs, when jogging.  Keep records of your exercise habits. Doing this can help you and your health care provider adjust your diabetes management plan as needed. Write down: ? Food that you eat before and after you exercise. ? Blood glucose levels before and after you exercise. ? The type and amount of exercise you have done. ? When your insulin is expected to peak, if you use   insulin. Avoid exercising at times when your insulin is peaking.  When you start a new exercise or activity, work with your health care provider to make sure the activity is safe for you, and to adjust your insulin, medicines, or food intake as needed.  Drink plenty of water while  you exercise to prevent dehydration or heat stroke. Drink enough fluid to keep your urine clear or pale yellow. Summary  Exercising regularly is important for your overall health, especially when you have diabetes (diabetes mellitus).  Exercising has many health benefits, such as increasing muscle strength and bone density and reducing body fat and stress.  Your health care provider or certified diabetes educator can help you make a plan for the type and frequency of exercise (activity plan) that works for you.  When you start a new exercise or activity, work with your health care provider to make sure the activity is safe for you, and to adjust your insulin, medicines, or food intake as needed. This information is not intended to replace advice given to you by your health care provider. Make sure you discuss any questions you have with your health care provider. Document Revised: 04/20/2017 Document Reviewed: 03/07/2016 Elsevier Patient Education  2020 [employer] Inc.  

## 2020-05-16 LAB — T4 AND TSH
T4, Total: 6.4 ug/dL (ref 4.5–12.0)
TSH: 7.25 u[IU]/mL — ABNORMAL HIGH (ref 0.450–4.500)

## 2020-05-18 ENCOUNTER — Encounter: Payer: Self-pay | Admitting: Physician Assistant

## 2020-05-18 ENCOUNTER — Telehealth: Payer: Self-pay

## 2020-05-18 DIAGNOSIS — E034 Atrophy of thyroid (acquired): Secondary | ICD-10-CM

## 2020-05-18 DIAGNOSIS — E039 Hypothyroidism, unspecified: Secondary | ICD-10-CM

## 2020-05-18 MED ORDER — LEVOTHYROXINE SODIUM 100 MCG PO TABS: 100.0000 ug | ORAL_TABLET | Freq: Every day | ORAL | 3 refills | Status: DC

## 2020-05-18 NOTE — Telephone Encounter (Signed)
-----   Message from Margaretann Loveless, [place] sent at 05/17/2020 11:12 AM EDT ----- Thyroid is still under-corrected. If taking the levothyroxine , would recommend to increase to . Can recheck in 6-8 weeks. No appt needed, just come for labs in that time frame.

## 2020-05-18 NOTE — Telephone Encounter (Signed)
Seen by patient Kristin Chavez on 05/17/2020 11:12 AM

## 2020-05-28 ENCOUNTER — Other Ambulatory Visit: Payer: Self-pay | Admitting: Physician Assistant

## 2020-06-20 ENCOUNTER — Other Ambulatory Visit: Payer: Self-pay | Admitting: Physician Assistant

## 2020-06-20 DIAGNOSIS — I152 Hypertension secondary to endocrine disorders: Secondary | ICD-10-CM

## 2020-06-20 DIAGNOSIS — E1159 Type 2 diabetes mellitus with other circulatory complications: Secondary | ICD-10-CM

## 2020-07-01 ENCOUNTER — Encounter: Payer: Self-pay | Admitting: Physician Assistant

## 2020-07-01 DIAGNOSIS — E034 Atrophy of thyroid (acquired): Secondary | ICD-10-CM

## 2020-07-01 MED ORDER — LEVOTHYROXINE SODIUM 100 MCG PO TABS: 100.0000 ug | ORAL_TABLET | Freq: Every day | ORAL | 3 refills | Status: DC

## 2020-07-05 ENCOUNTER — Other Ambulatory Visit: Payer: Self-pay | Admitting: Physician Assistant

## 2020-07-05 DIAGNOSIS — E034 Atrophy of thyroid (acquired): Secondary | ICD-10-CM

## 2020-07-15 MED ORDER — LEVOTHYROXINE SODIUM 100 MCG PO TABS: 100.0000 ug | ORAL_TABLET | Freq: Every day | ORAL | 3 refills | Status: DC

## 2020-07-15 NOTE — Addendum Note (Signed)
Addended by: Marjie Skiff on: 07/15/2020 11:39 AM   Modules accepted: Orders

## 2020-08-17 ENCOUNTER — Ambulatory Visit: Payer: Self-pay | Admitting: Physician Assistant

## 2020-09-22 ENCOUNTER — Other Ambulatory Visit: Payer: Self-pay | Admitting: Physician Assistant

## 2020-09-22 DIAGNOSIS — I152 Hypertension secondary to endocrine disorders: Secondary | ICD-10-CM

## 2020-09-22 NOTE — Telephone Encounter (Signed)
Check for change in dosage- possible 25 mg/daily at visit 03/2020

## 2020-09-22 NOTE — Telephone Encounter (Signed)
Requested medication (s) are due for refill today - overdue  Requested medication (s) are on the active medication list -yes  Future visit scheduled -no  Last refill: 02/26/20  Notes to clinic: Patient requesting RF- fails lab protocol- possible irregular usage- sent for review   Requested Prescriptions  Pending Prescriptions Disp Refills   losartan (COZAAR) 50 MG tablet [Pharmacy Med Name: LOSARTAN POTASSIUM 50 MG TAB] 90 tablet 0    Sig: TAKE 1 TABLET BY MOUTH EVERY DAY      Cardiovascular:  Angiotensin Receptor Blockers Failed - 09/22/2020 11:42 AM      Failed - Cr in normal range and within 180 days    Creatinine, Ser  Date Value Ref Range Status  02/13/2020 0.82 0.57 - 1.00 mg/dL Final          Failed - K in normal range and within 180 days    Potassium  Date Value Ref Range Status  02/13/2020 3.9 3.5 - 5.2 mmol/L Final          Passed - Patient is not pregnant      Passed - Last BP in normal range    BP Readings from Last 1 Encounters:  05/15/20 138/89          Passed - Valid encounter within last 6 months    Recent Outpatient Visits           4 months ago Type 2 diabetes mellitus without complication, without long-term current use of insulin [facility])   [facility] [place], [place], [place]   5 months ago Essential hypertension   [employer], [place], PA-C   7 months ago Adult hypothyroidism   [employer], [place], PA-C   1 year ago Hypothyroidism due to acquired atrophy of thyroid   [facility] [place], [place]andra [place], [place]   1 year ago No-show for appointment   [facility], [place], [place]                    Requested Prescriptions  Pending Prescriptions Disp Refills   losartan (COZAAR) 50 MG tablet [Pharmacy Med Name: LOSARTAN POTASSIUM 50 MG TAB] 90 tablet 0    Sig: TAKE 1 TABLET BY MOUTH EVERY DAY      Cardiovascular:  Angiotensin  Receptor Blockers Failed - 09/22/2020 11:42 AM      Failed - Cr in normal range and within 180 days    Creatinine, Ser  Date Value Ref Range Status  02/13/2020 0.82 0.57 - 1.00 mg/dL Final          Failed - K in normal range and within 180 days    Potassium  Date Value Ref Range Status  02/13/2020 3.9 3.5 - 5.2 mmol/L Final          Passed - Patient is not pregnant      Passed - Last BP in normal range    BP Readings from Last 1 Encounters:  05/15/20 138/89          Passed - Valid encounter within last 6 months    Recent Outpatient Visits           4 months ago Type 2 diabetes mellitus without complication, without long-term current use of insulin [facility])   [facility] [place], [place], [place]   5 months ago Essential hypertension   [facility] [place], [place]   7 months ago Adult hypothyroidism  [facility] [place], [place], [place]   1 year ago Hypothyroidism due to acquired atrophy of thyroid   [facility] [place], [place]andra [place], [place]   1 year ago No-show for appointment   [facility] [place], [place]

## 2020-09-23 NOTE — Telephone Encounter (Signed)
Is she on 25mg  or 50mg 

## 2020-10-23 IMAGING — CR DG CHEST 2V
1 series · 2 of 2 positions shown · non-contrast
Comparison: None.

CLINICAL DATA: Dizziness, nausea and diaphoresis following a 24
hour fast completed this morning.

EXAM:
CHEST - 2 VIEW

[Series 1: dg chest 2 view · 0.14mm/px · 2 of 2 slices shown]
[im 1/2]
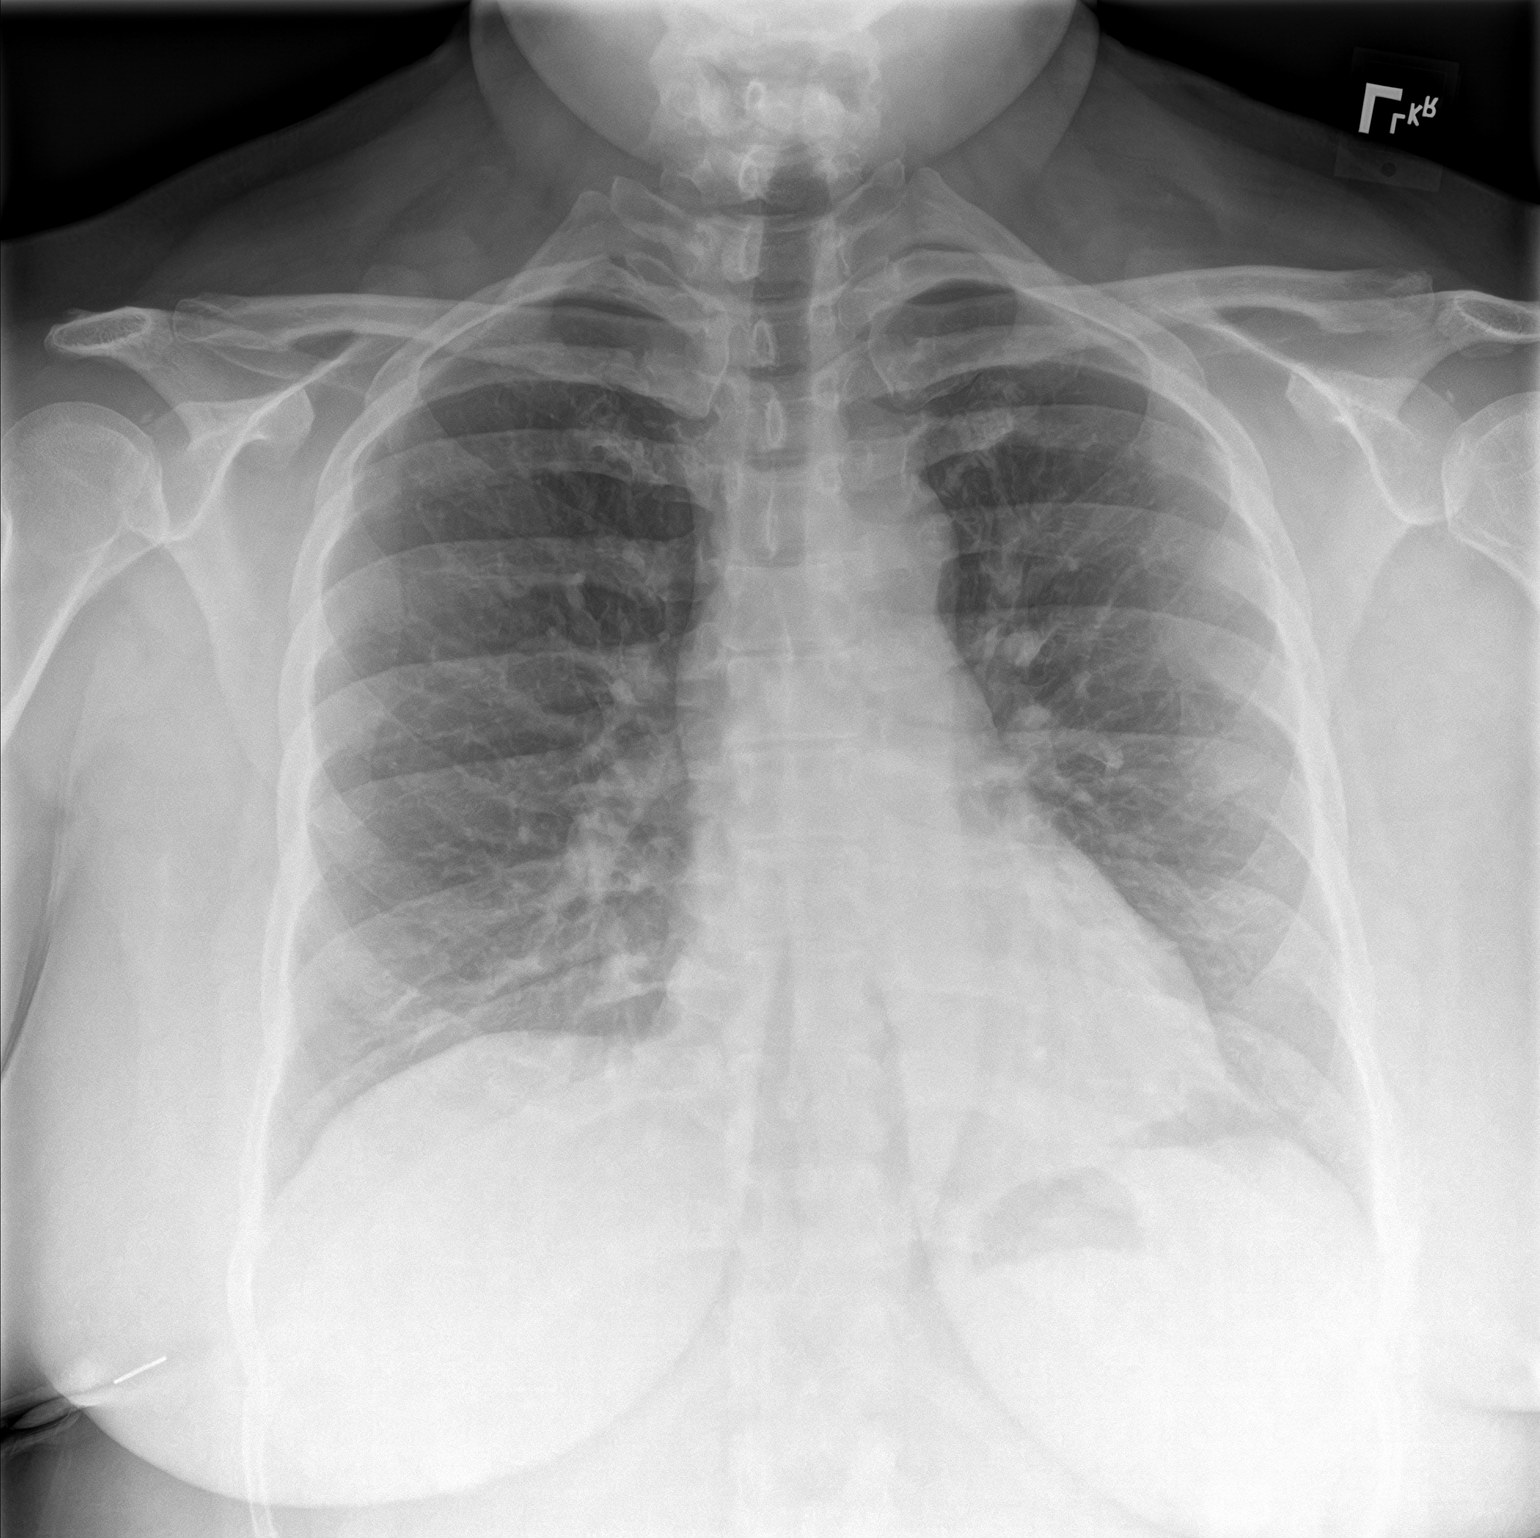
[im 2/2]
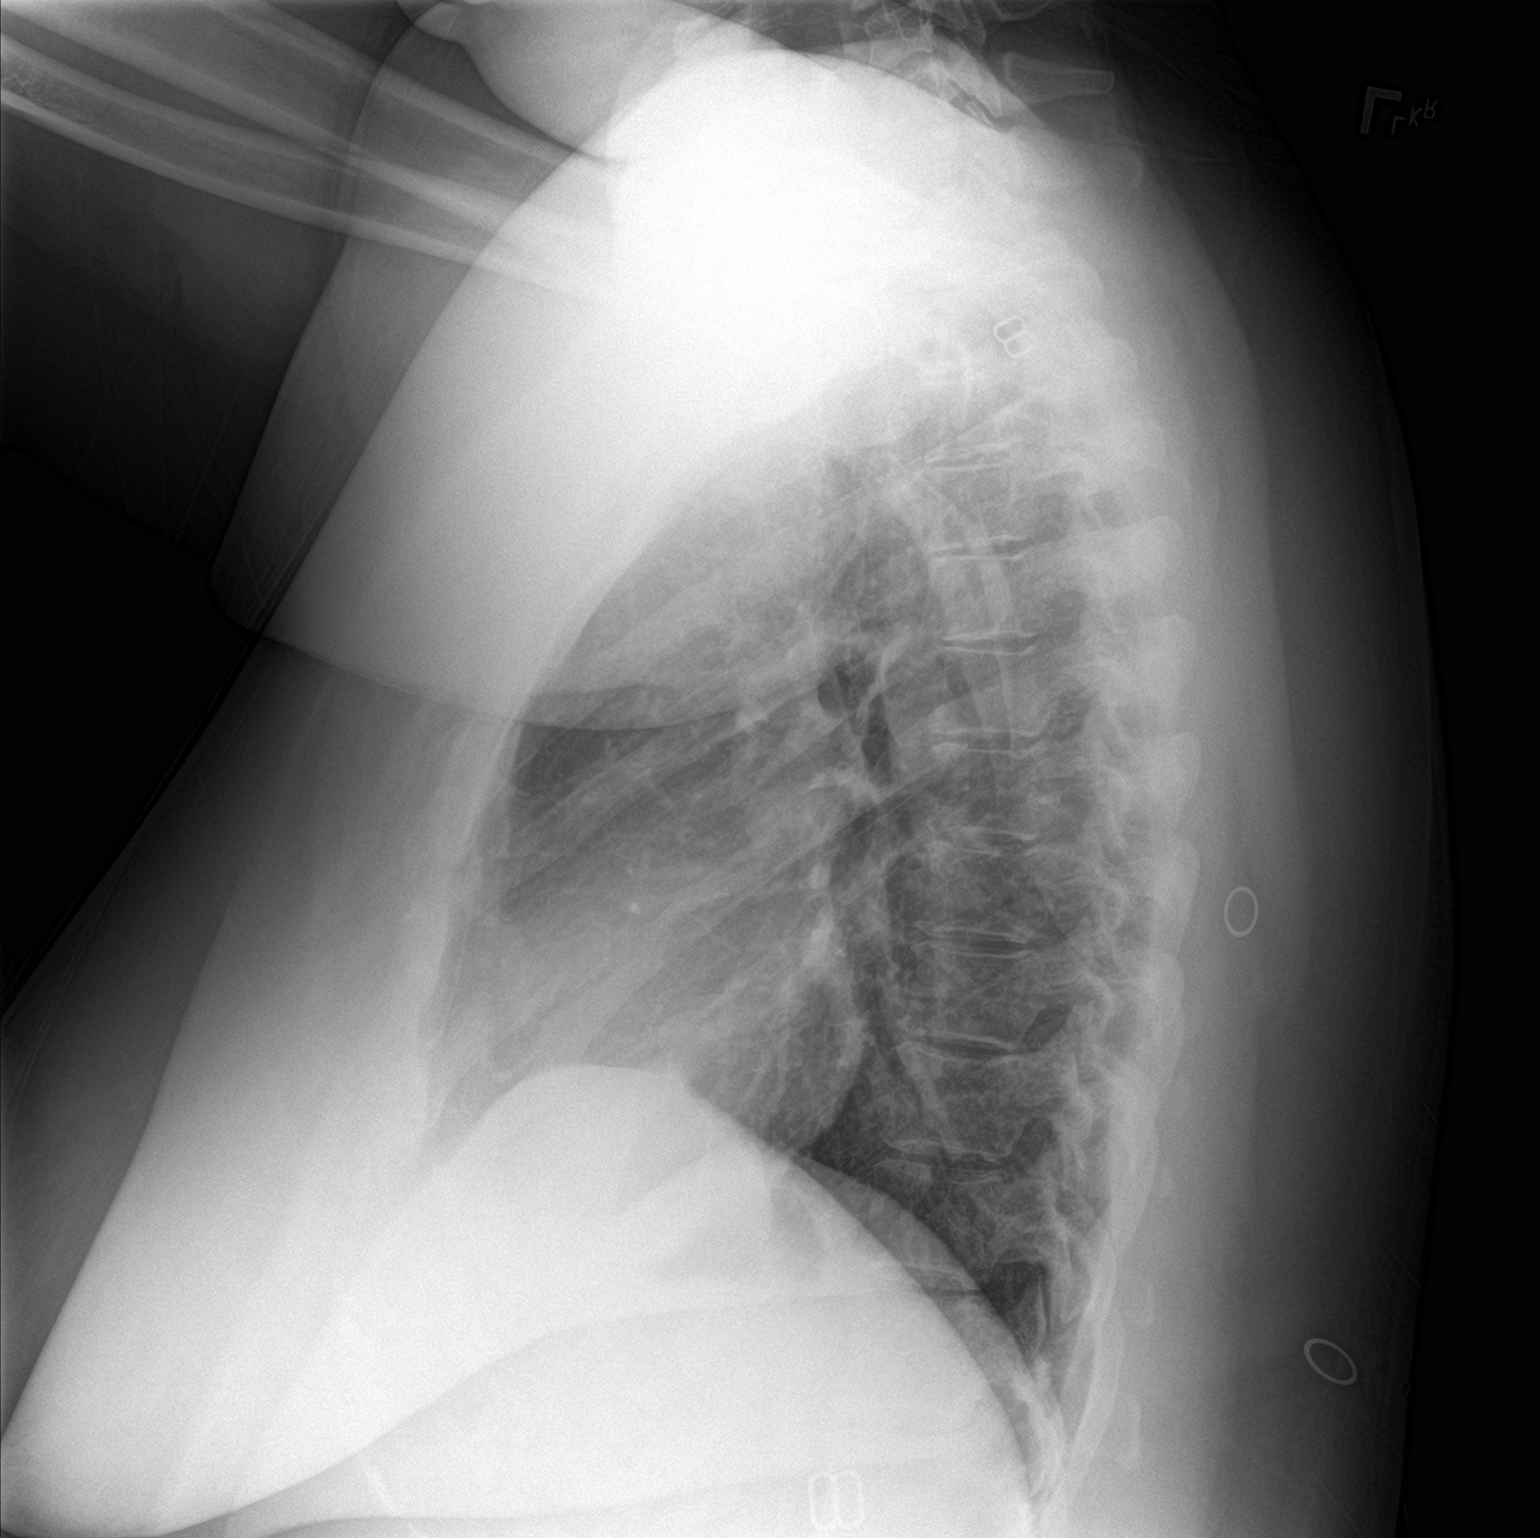

[2 of 2 positions shown; findings below may reference images not displayed]

FINDINGS: Normal sized heart. Clear lungs. Minimal peribronchial thickening.
Mild thoracic spine degenerative changes.
IMPRESSION: Minimal bronchitic changes.

## 2020-11-12 ENCOUNTER — Ambulatory Visit: Payer: BC Managed Care – PPO | Admitting: Physician Assistant

## 2020-11-12 ENCOUNTER — Other Ambulatory Visit: Payer: Self-pay

## 2020-11-12 VITALS — BP 145/82 | HR 73 | Temp 99.3°F | Ht 66.0 in | Wt 286.6 lb

## 2020-11-12 DIAGNOSIS — E034 Atrophy of thyroid (acquired): Secondary | ICD-10-CM

## 2020-11-12 DIAGNOSIS — E119 Type 2 diabetes mellitus without complications: Secondary | ICD-10-CM

## 2020-11-12 LAB — POCT GLYCOSYLATED HEMOGLOBIN (HGB A1C)
Estimated Average Glucose: 137
Hemoglobin A1C: 6.4 % — AB (ref 4.0–5.6)

## 2020-11-12 NOTE — Progress Notes (Signed)
Established Patient Office Visit  Subjective:  Patient ID: Kristin Chavez, female    DOB: April 28, 1971  Age: 50 y.o. MRN: [id number]  CC:  Chief Complaint  Patient presents with  . Rash  . Hypothyroidism    HPI Kristin Chavez presents for thyroid level check, A1c check and to get a rash checked she has had for about a month.  Diabetes Mellitus Type II, Follow-up  Lab Results  Component Value Date   HGBA1C 6.4 (A) 11/12/2020   HGBA1C 6.3 (A) 05/15/2020   HGBA1C 6.5 (H) 02/13/2020   Wt Readings from Last 3 Encounters:  11/12/20 286 lb 9.6 oz (130 kg)  05/15/20 283 lb 3.2 oz (128.5 kg)  04/06/20 285 lb 9.6 oz (129.5 kg)   Last seen for diabetes 6 months ago.  Management since then includes continuing metformin 570m. She reports excellent compliance with treatment. She is not having side effects.   Symptoms: No fatigue No foot ulcerations  No appetite changes No nausea  No paresthesia of the feet  No polydipsia  No polyuria No visual disturbances   No vomiting     Home blood sugar records: fasting range: 130s-140s  Episodes of hypoglycemia? No    Current insulin regiment:  Most Recent Eye Exam: 6 months ago approximately. Current exercise: walking Current diet habits: in general, a "healthy" diet  , high fiber  Pertinent Labs: Lab Results  Component Value Date   CHOL 189 02/12/2018   HDL 59 02/12/2018   LDLCALC 105 (H) 02/12/2018   TRIG 123 02/12/2018   CHOLHDL 3.2 02/12/2018   Lab Results  Component Value Date   NA 138 02/13/2020   K 3.9 02/13/2020   CREATININE 0.82 02/13/2020   GFRNONAA 84 02/13/2020   GFRAA 97 02/13/2020   GLUCOSE 102 (H) 02/13/2020     ---------------------------------------------------------------------------------------------------  Past Medical History:  Diagnosis Date  . Diabetes mellitus without complication ([place]   . Hypertension   . Hypothyroidism   . Liver disease, chronic   . Thyroid disease   . Thyroid disease      Past Surgical History:  Procedure Laterality Date  . CESAREAN SECTION    . COLONOSCOPY WITH PROPOFOL N/A 04/01/2016   Procedure: COLONOSCOPY WITH PROPOFOL;  Surgeon: MLollie Sails MD;  [facility];  Service: Endoscopy;  Laterality: N/A;  . Hx of laparoscopy  1998    Family History  Problem Relation Age of Onset  . Hypertension Mother   . Cancer Paternal Grandmother     Social History   Socioeconomic History  . Marital status: Married    Spouse name: Not on file  . Number of children: Not on file  . Years of education: Not on file  . Highest education level: Not on file  Occupational History  . Not on file  Tobacco Use  . Smoking status: Never Smoker  . Smokeless tobacco: Never Used  Substance and Sexual Activity  . Alcohol use: No  . Drug use: No  . Sexual activity: Yes    Birth control/protection: Other-see comments  Other Topics Concern  . Not on file  Social History Narrative  . Not on file   Social Determinants of Health   Financial Resource Strain: Not on file  Food Insecurity: Not on file  Transportation Needs: Not on file  Physical Activity: Not on file  Stress: Not on file  Social Connections: Not on file  Intimate Partner Violence: Not on file    Outpatient Medications  Prior to Visit  Medication Sig Dispense Refill  . acetaminophen (TYLENOL) 500 MG tablet Take 500 mg by mouth every 6 (six) hours as needed for mild pain or fever.     . blood glucose meter kit and supplies Dispense based on patient and insurance preference. Use up to four times daily as directed. (FOR ICD-10 E10.9, E11.9). 1 each 0  . dicyclomine (BENTYL) 20 MG tablet Take 1 tablet (20 mg total) by mouth 3 (three) times daily as needed (for cramps). 90 tablet 5  . hydrochlorothiazide (HYDRODIURIL) 25 MG tablet Take 1 tablet (25 mg total) by mouth daily. 90 tablet 3  . losartan (COZAAR) 50 MG tablet TAKE 1 TABLET BY MOUTH EVERY DAY 90 tablet 1  . metFORMIN  (GLUCOPHAGE) 500 MG tablet Take 1 tablet (500 mg total) by mouth daily with breakfast. 90 tablet 1  . levothyroxine (SYNTHROID) 100 MCG tablet Take 1 tablet (100 mcg total) by mouth daily. 90 tablet 3   No facility-administered medications prior to visit.    No Known Allergies  ROS Review of Systems  Constitutional: Negative.   Respiratory: Negative.   Cardiovascular: Negative.   Endocrine: Negative.   Skin: Positive for rash.  Neurological: Negative.       Objective:    Physical Exam Vitals reviewed.  Constitutional:      General: She is not in acute distress.    Appearance: Normal appearance. She is well-developed and well-nourished. She is obese. She is not ill-appearing or diaphoretic.  Cardiovascular:     Rate and Rhythm: Normal rate and regular rhythm.     Heart sounds: Normal heart sounds. No murmur heard. No friction rub. No gallop.   Pulmonary:     Effort: Pulmonary effort is normal. No respiratory distress.     Breath sounds: Normal breath sounds. No wheezing or rales.  Musculoskeletal:     Cervical back: Normal range of motion and neck supple.  Neurological:     Mental Status: She is alert.     BP (!) 145/82 (BP Location: Left Arm, Patient Position: Sitting, Cuff Size: Large)   Pulse 73   Temp 99.3 F (37.4 C) (Oral)   Ht 5' 6" (1.676 m)   Wt 286 lb 9.6 oz (130 kg)   BMI 46.26 kg/m  Wt Readings from Last 3 Encounters:  11/12/20 286 lb 9.6 oz (130 kg)  05/15/20 283 lb 3.2 oz (128.5 kg)  04/06/20 285 lb 9.6 oz (129.5 kg)     Health Maintenance Due  Topic Date Due  . Hepatitis C Screening  Never done  . COVID-19 Vaccine (1) Never done  . OPHTHALMOLOGY EXAM  Never done  . TETANUS/TDAP  Never done  . PAP SMEAR-Modifier  Never done  . INFLUENZA VACCINE  05/10/2020  . MAMMOGRAM  Never done    There are no preventive care reminders to display for this patient.  Lab Results  Component Value Date   TSH 4.910 (H) 11/12/2020   Lab Results   Component Value Date   WBC 8.3 02/13/2020   HGB 12.0 02/13/2020   HCT 36.9 02/13/2020   MCV 89 02/13/2020   PLT 307 02/13/2020   Lab Results  Component Value Date   NA 138 02/13/2020   K 3.9 02/13/2020   CO2 25 02/13/2020   GLUCOSE 102 (H) 02/13/2020   BUN 12 02/13/2020   CREATININE 0.82 02/13/2020   BILITOT 0.4 02/13/2020   ALKPHOS 49 02/13/2020   AST 18 02/13/2020   ALT  15 02/13/2020   PROT 6.8 02/13/2020   ALBUMIN 4.3 02/13/2020   CALCIUM 9.8 02/13/2020   ANIONGAP 11 08/31/2019   Lab Results  Component Value Date   CHOL 189 02/12/2018   Lab Results  Component Value Date   HDL 59 02/12/2018   Lab Results  Component Value Date   LDLCALC 105 (H) 02/12/2018   Lab Results  Component Value Date   TRIG 123 02/12/2018   Lab Results  Component Value Date   CHOLHDL 3.2 02/12/2018   Lab Results  Component Value Date   HGBA1C 6.4 (A) 11/12/2020      Assessment & Plan:   1. Type 2 diabetes mellitus without complication, without long-term current use of insulin (HCC) Stable. Continue metformin 564m daily. Will check labs as below and f/u pending results. - POCT HgB A1C  2. Hypothyroidism due to acquired atrophy of thyroid Stable. Continue levothyroxine 1154m. Will check labs as below and f/u pending results. - T4 AND TSH   I, JeMar DaringPA-C, have reviewed all documentation for this visit. The documentation on 12/01/20 for the exam, diagnosis, procedures, and orders are all accurate and complete.

## 2020-11-12 NOTE — Patient Instructions (Signed)
Hives Hives (urticaria) are itchy, red, swollen areas on the skin. Hives can appear on any part of the body. Hives often fade within 24 hours (acute hives). Sometimes, new hives appear after old ones fade and the cycle can continue for several days or weeks (chronic hives). Hives do not spread from person to person (are not contagious). Hives come from the body's reaction to something a person is allergic to (allergen), something that causes irritation, or various other triggers. When a person is exposed to a trigger, his or her body releases a chemical (histamine) that causes redness, itching, and swelling. Hives can appear right after exposure to a trigger or hours later. What are the causes? This condition may be caused by:  Allergies to foods or ingredients.  Insect bites or stings.  Exposure to pollen or pets.  Contact with latex or chemicals.  Spending time in sunlight, heat, or cold (exposure).  Exercise.  Stress.  Certain medicines. You can also get hives from other medical conditions and treatments, such as:  Viruses, including the common cold.  Bacterial infections, such as urinary tract infections and strep throat.  Certain medicines.  Allergy shots.  Blood transfusions. Sometimes, the cause of this condition is not known (idiopathic hives). What increases the risk? You are more likely to develop this condition if you:  Are a woman.  Have food allergies, especially to citrus fruits, milk, eggs, peanuts, tree nuts, or shellfish.  Are allergic to: ? Medicines. ? Latex. ? Insects. ? Animals. ? Pollen. What are the signs or symptoms? Common symptoms of this condition include raised, itchy, red or white bumps or patches on your skin. These areas may:  Become large and swollen (welts).  Change in shape and location, quickly and repeatedly.  Be separate hives or connect over a large area of skin.  Sting or become painful.  Turn white when pressed in the  center (blanch). In severe cases, yourhands, feet, and face may also become swollen. This may occur if hives develop deeper in your skin.   How is this diagnosed? This condition may be diagnosed by your symptoms, medical history, and physical exam.  Your skin, urine, or blood may be tested to find out what is causing your hives and to rule out other health issues.  Your health care provider may also remove a small sample of skin from the affected area and examine it under a microscope (biopsy). How is this treated? Treatment for this condition depends on the cause and severity of your symptoms. Your health care provider may recommend using cool, wet cloths (cool compresses) or taking cool showers to relieve itching. Treatment may include:  Medicines that help: ? Relieve itching (antihistamines). ? Reduce swelling (corticosteroids). ? Treat infection (antibiotics).  An injectable medicine (omalizumab). Your health care provider may prescribe this if you have chronic idiopathic hives and you continue to have symptoms even after treatment with antihistamines. Severe cases may require an emergency injection of adrenaline (epinephrine) to prevent a life-threatening allergic reaction (anaphylaxis). Follow these instructions at home: Medicines  Take and apply over-the-counter and prescription medicines only as told by your health care provider.  If you were prescribed an antibiotic medicine, take it as told by your health care provider. Do not stop using the antibiotic even if you start to feel better. Skin care  Apply cool compresses to the affected areas.  Do not scratch or rub your skin. General instructions  Do not take hot showers or baths. This can   make itching worse.  Do not wear tight-fitting clothing.  Use sunscreen and wear protective clothing when you are outside.  Avoid any substances that cause your hives. Keep a journal to help track what causes your hives. Write  down: ? What medicines you take. ? What you eat and drink. ? What products you use on your skin.  Keep all follow-up visits as told by your health care provider. This is important. Contact a health care provider if:  Your symptoms are not controlled with medicine.  Your joints are painful or swollen. Get help right away if:  You have a fever.  You have pain in your abdomen.  Your tongue or lips are swollen.  Your eyelids are swollen.  Your chest or throat feels tight.  You have trouble breathing or swallowing. These symptoms may represent a serious problem that is an emergency. Do not wait to see if the symptoms will go away. Get medical help right away. Call your local emergency services (911 in the [place] Do not drive yourself to the hospital. Summary  Hives (urticaria) are itchy, red, swollen areas on your skin. Hives come from the body's reaction to something a person is allergic to (allergen), something that causes irritation, or various other triggers.  Treatment for this condition depends on the cause and severity of your symptoms.  Avoid any substances that cause your hives. Keep a journal to help track what causes your hives.  Take and apply over-the-counter and prescription medicines only as told by your health care provider.  Keep all follow-up visits as told by your health care provider. This is important. This information is not intended to replace advice given to you by your health care provider. Make sure you discuss any questions you have with your health care provider. Document Revised: 04/11/2018 Document Reviewed: 04/11/2018 Elsevier Patient Education  2021 [employer] Inc.  

## 2020-11-13 ENCOUNTER — Other Ambulatory Visit: Payer: Self-pay | Admitting: Physician Assistant

## 2020-11-13 DIAGNOSIS — E034 Atrophy of thyroid (acquired): Secondary | ICD-10-CM

## 2020-11-13 LAB — T4 AND TSH
T4, Total: 7.1 ug/dL (ref 4.5–12.0)
TSH: 4.91 u[IU]/mL — ABNORMAL HIGH (ref 0.450–4.500)

## 2020-11-13 MED ORDER — LEVOTHYROXINE SODIUM 112 MCG PO TABS: 112.0000 ug | ORAL_TABLET | Freq: Every day | ORAL | 3 refills | Status: DC

## 2020-11-13 NOTE — Progress Notes (Signed)
Levothyroxine increased to from 

## 2020-12-01 ENCOUNTER — Encounter: Payer: Self-pay | Admitting: Physician Assistant

## 2020-12-17 ENCOUNTER — Other Ambulatory Visit: Payer: Self-pay | Admitting: Physician Assistant

## 2020-12-17 NOTE — Telephone Encounter (Signed)
Requested Prescriptions  Pending Prescriptions Disp Refills  . metFORMIN (GLUCOPHAGE) 500 MG tablet [Pharmacy Med Name: METFORMIN HCL 500 MG TABLET] 180 tablet 1    Sig: TAKE 1 TABLET (500 MG TOTAL) BY MOUTH 2 (TWO) TIMES DAILY WITH A MEAL.     Endocrinology:  Diabetes - Biguanides Passed - 12/17/2020  1:19 AM      Passed - Cr in normal range and within 360 days    Creatinine, Ser  Date Value Ref Range Status  02/13/2020 0.82 0.57 - 1.00 mg/dL Final         Passed - HBA1C is between 0 and 7.9 and within 180 days    Hemoglobin A1C  Date Value Ref Range Status  11/12/2020 6.4 (A) 4.0 - 5.6 % Final   Hgb A1c MFr Bld  Date Value Ref Range Status  02/13/2020 6.5 (H) 4.8 - 5.6 % Final    Comment:             Prediabetes: 5.7 - 6.4          Diabetes: >6.4          Glycemic control for adults with diabetes: <7.0          Passed - eGFR in normal range and within 360 days    GFR calc Af Amer  Date Value Ref Range Status  02/13/2020 97 >59 mL/min/1.73 Final    Comment:    **[facility] currently reports eGFR in compliance with the current**   recommendations of the [employer] Kid[employer] Foundation. [facility] will   update reporting as new guidelines are published from the NKF-ASN   Task force.    GFR calc non Af Amer  Date Value Ref Range Status  02/13/2020 84 >59 mL/min/1.73 Final         Passed - Valid encounter within last 6 months    Recent Outpatient Visits          1 month ago Type 2 diabetes mellitus without complication, without long-term current use of insulin (HCC)   [facility] [place]ette, Jennifer M, PA-C   7 months ago Type 2 diabetes mellitus without complication, without long-term current use of insulin (HCC)   [facility] Burnette, Jennifer M, PA-C   8 months ago Essential hypertension   [facility] Burnette, Jennifer M, PA-C   10 months ago Adult hypothyroidism   [facility] Burnette, Jennifer M, PA-C   1 year  ago Hypothyroidism due to acquired atrophy of thyroid   [facility] Burnette, Jennifer M, PA-C              

## 2021-04-03 ENCOUNTER — Other Ambulatory Visit: Payer: Self-pay | Admitting: Physician Assistant

## 2021-04-03 DIAGNOSIS — E1159 Type 2 diabetes mellitus with other circulatory complications: Secondary | ICD-10-CM

## 2021-04-03 DIAGNOSIS — I152 Hypertension secondary to endocrine disorders: Secondary | ICD-10-CM

## 2021-07-15 IMAGING — CR DG CHEST 2V
2 series · 2 of 2 positions shown · non-contrast
Comparison: Radiograph 12/04/2018

CLINICAL DATA: Post cough, [id number] positive

EXAM:
CHEST - 2 VIEW

[chest pa]
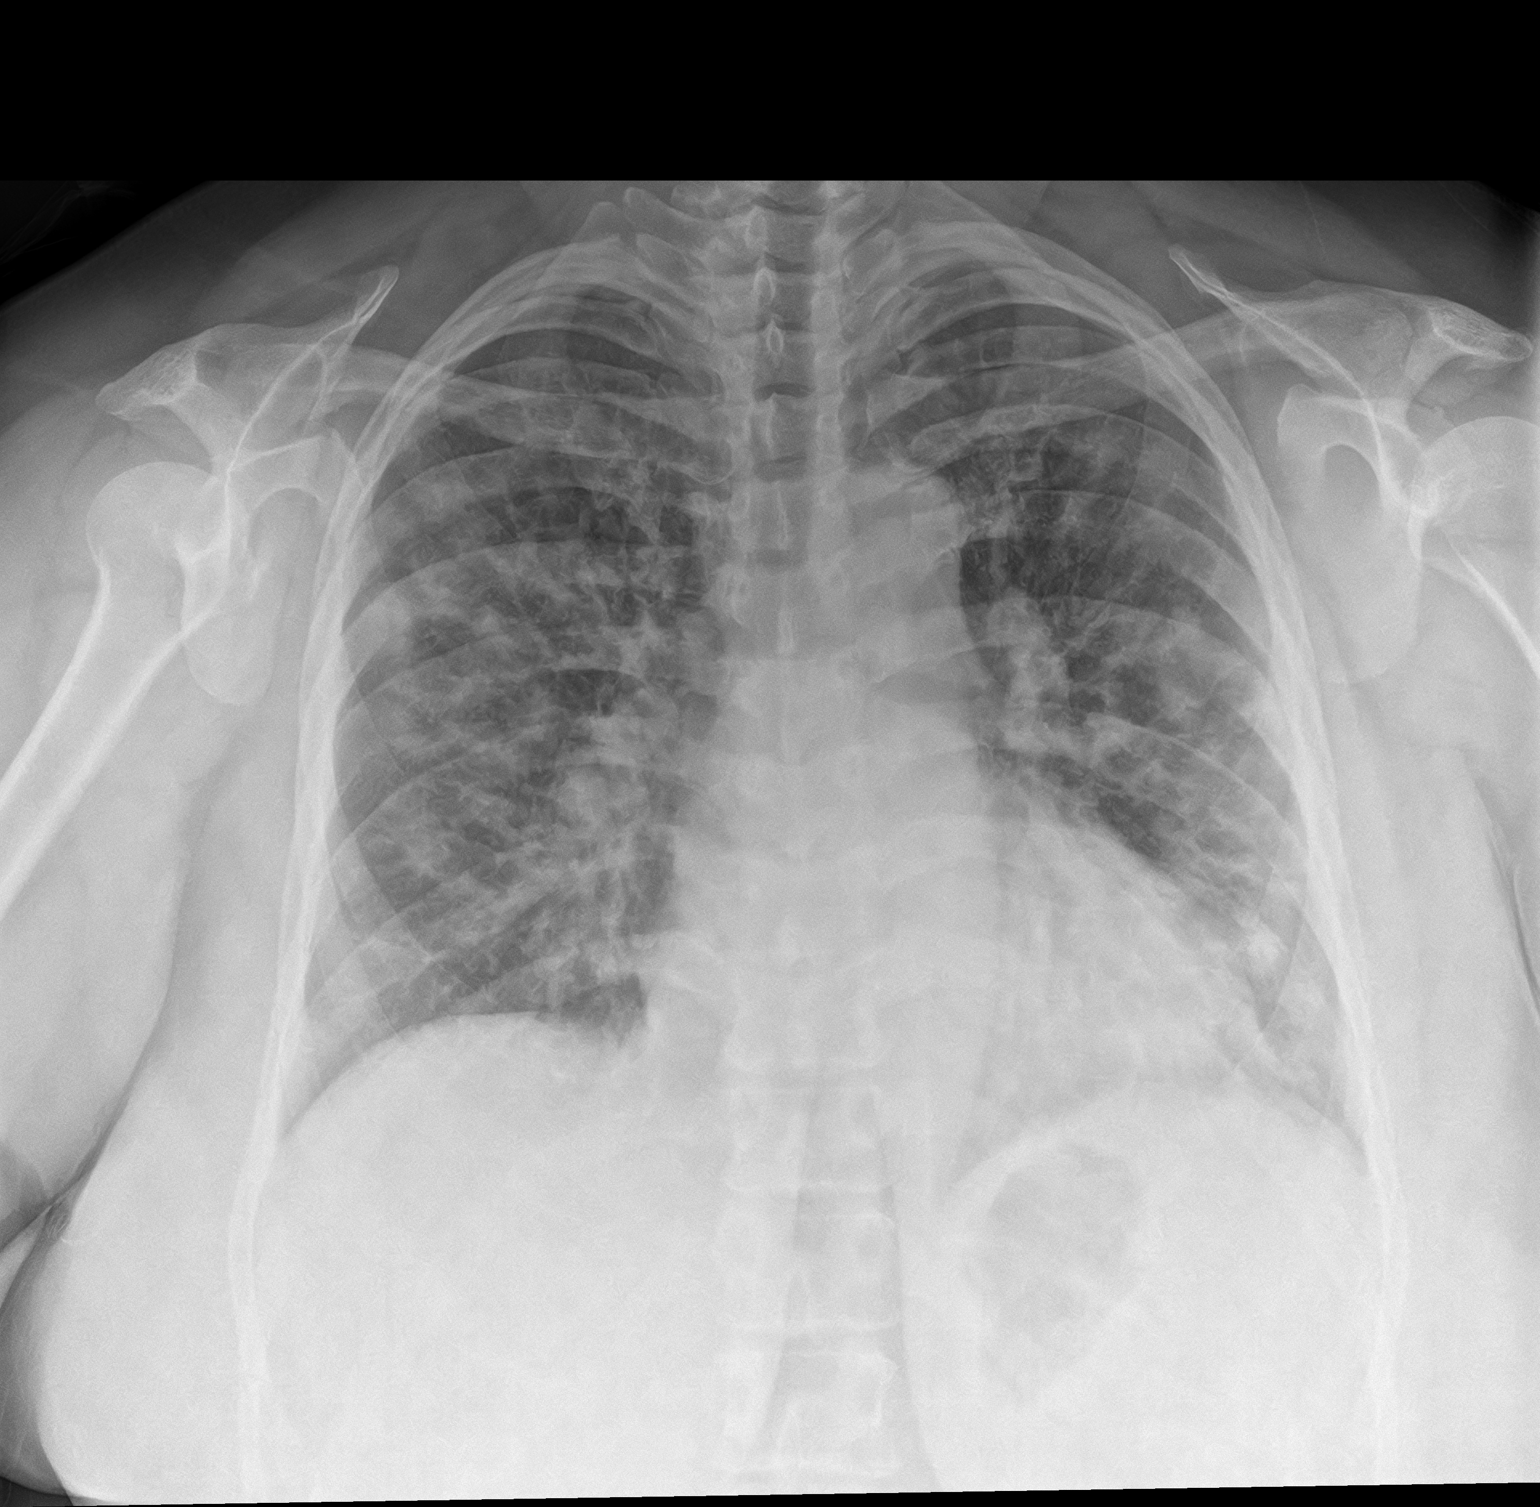

[chest lat]
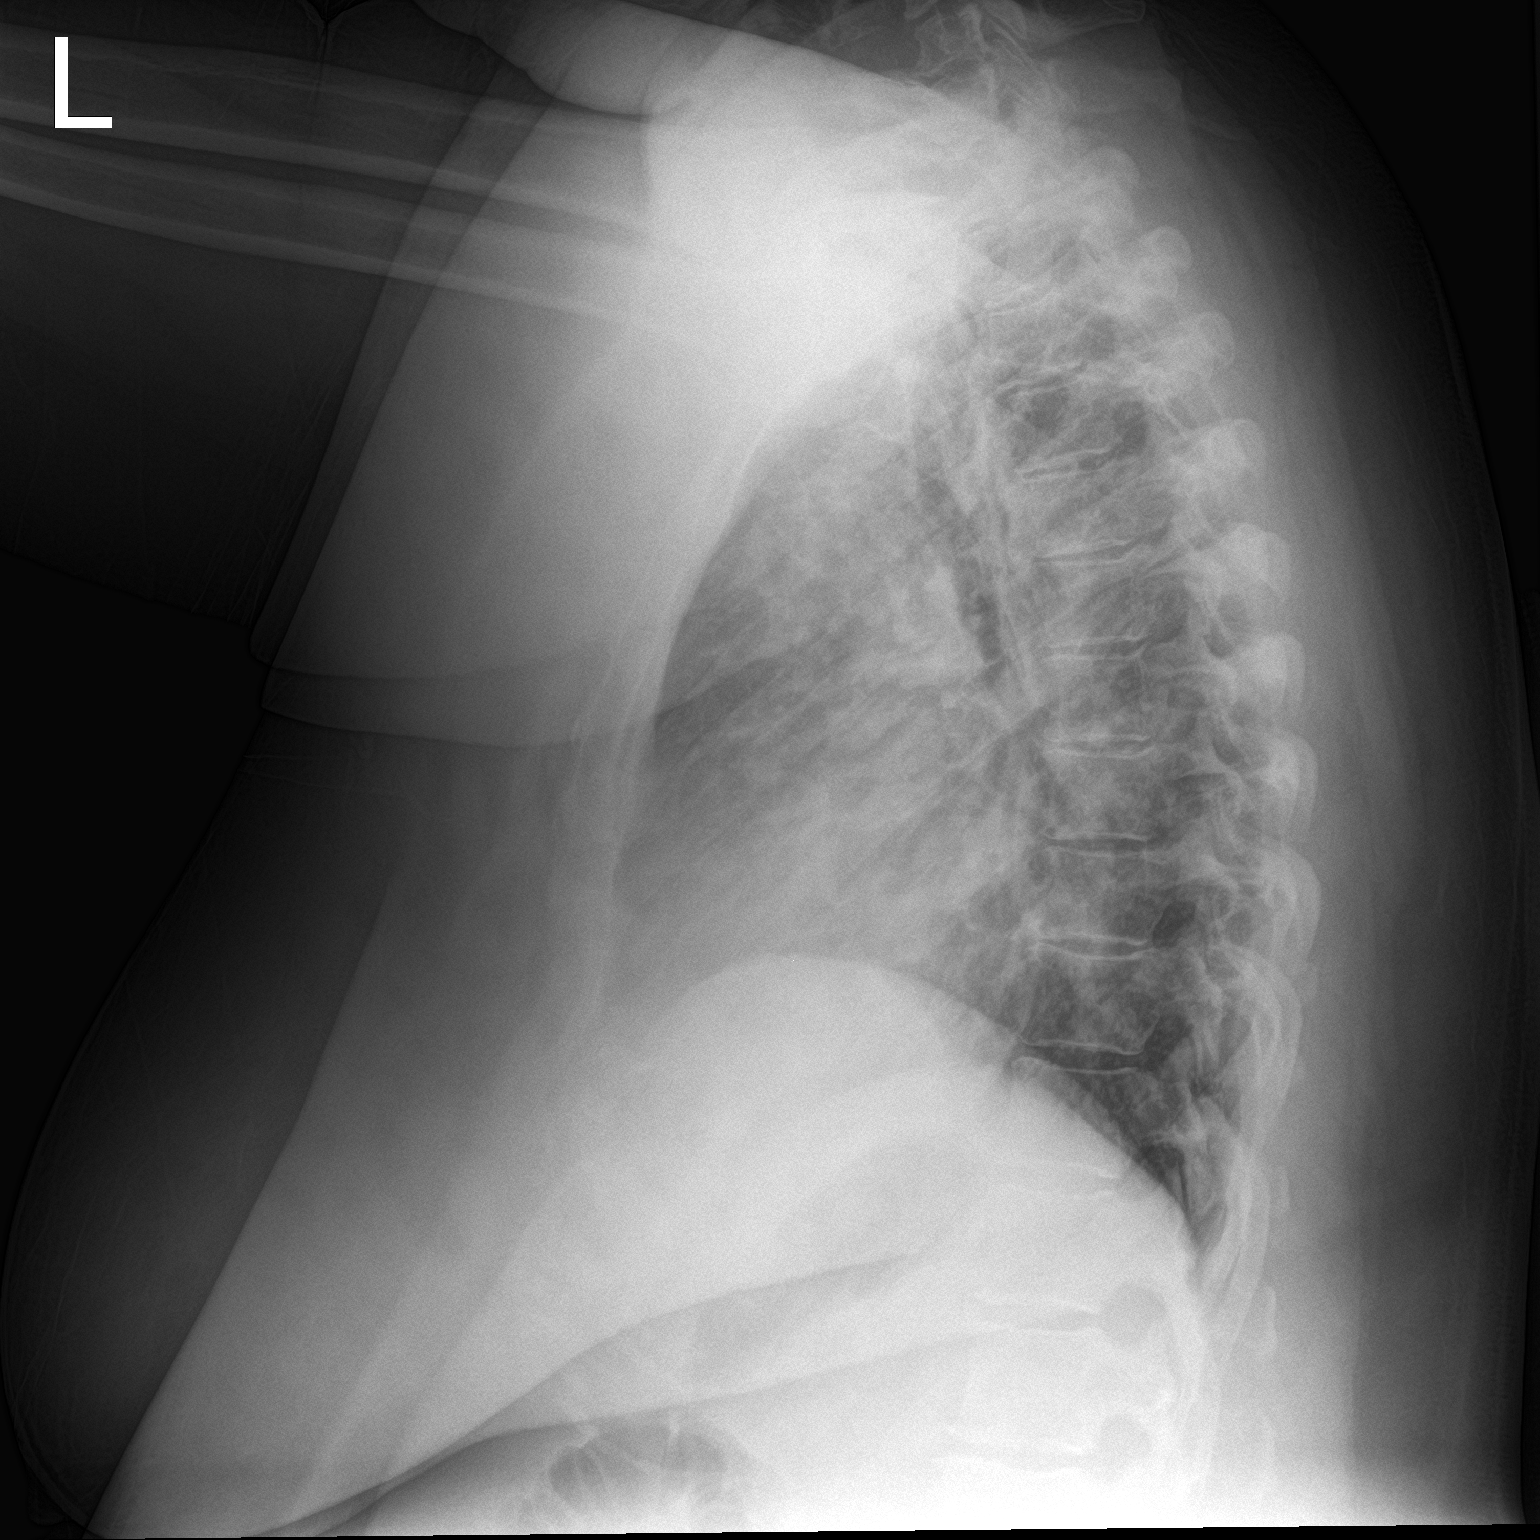

[2 of 2 positions shown; findings below may reference images not displayed]

FINDINGS: Widespread multifocal interstitial and patchy airspace opacities
throughout both lungs. No pneumothorax. No effusion. Cardiac size at
the upper limits of normal. Cardiomediastinal contours otherwise
unremarkable. No acute osseous or soft tissue abnormality.
IMPRESSION: Widespread multifocal interstitial and patchy airspace opacities
throughout both lungs, consistent with multifocal pneumonia.

## 2021-09-13 ENCOUNTER — Other Ambulatory Visit: Payer: Self-pay | Admitting: Physician Assistant

## 2021-09-13 ENCOUNTER — Other Ambulatory Visit: Payer: Self-pay | Admitting: Family Medicine

## 2021-09-13 DIAGNOSIS — I152 Hypertension secondary to endocrine disorders: Secondary | ICD-10-CM

## 2021-09-13 DIAGNOSIS — I10 Essential (primary) hypertension: Secondary | ICD-10-CM

## 2021-09-13 NOTE — Telephone Encounter (Signed)
I called pt and made her an appt for her 6 month check up with Merita Norton, NP for 10/14/2021 at 2:20 in office visit.  A request for the losartan was received however she said she had plenty of that and needed the HCTZ that she was out of that.   She is going to call [facility] in [place] and let them know the wrong medication was sent in.     I have refused the losartan since she has plenty of it.

## 2021-10-14 ENCOUNTER — Other Ambulatory Visit: Payer: Self-pay

## 2021-10-14 ENCOUNTER — Ambulatory Visit: Payer: BC Managed Care – PPO | Admitting: Family Medicine

## 2021-10-14 ENCOUNTER — Encounter: Payer: Self-pay | Admitting: Family Medicine

## 2021-10-14 VITALS — BP 156/76 | HR 90 | Resp 16 | Wt 300.9 lb

## 2021-10-14 DIAGNOSIS — E1159 Type 2 diabetes mellitus with other circulatory complications: Secondary | ICD-10-CM

## 2021-10-14 DIAGNOSIS — E7800 Pure hypercholesterolemia, unspecified: Secondary | ICD-10-CM | POA: Diagnosis not present

## 2021-10-14 DIAGNOSIS — Z5320 Procedure and treatment not carried out because of patient's decision for unspecified reasons: Secondary | ICD-10-CM | POA: Insufficient documentation

## 2021-10-14 DIAGNOSIS — E119 Type 2 diabetes mellitus without complications: Secondary | ICD-10-CM

## 2021-10-14 DIAGNOSIS — Z1159 Encounter for screening for other viral diseases: Secondary | ICD-10-CM | POA: Diagnosis not present

## 2021-10-14 DIAGNOSIS — E039 Hypothyroidism, unspecified: Secondary | ICD-10-CM

## 2021-10-14 DIAGNOSIS — E559 Vitamin D deficiency, unspecified: Secondary | ICD-10-CM

## 2021-10-14 DIAGNOSIS — I10 Essential (primary) hypertension: Secondary | ICD-10-CM | POA: Insufficient documentation

## 2021-10-14 DIAGNOSIS — I152 Hypertension secondary to endocrine disorders: Secondary | ICD-10-CM

## 2021-10-14 DIAGNOSIS — Z1231 Encounter for screening mammogram for malignant neoplasm of breast: Secondary | ICD-10-CM | POA: Insufficient documentation

## 2021-10-14 LAB — POCT GLYCOSYLATED HEMOGLOBIN (HGB A1C): Hemoglobin A1C: 7 % — AB (ref 4.0–5.6)

## 2021-10-14 MED ORDER — LOSARTAN POTASSIUM-HCTZ 100-25 MG PO TABS: 1.0000 | ORAL_TABLET | Freq: Every day | ORAL | 3 refills | Status: DC

## 2021-10-14 MED ORDER — METFORMIN HCL 500 MG PO TABS: 1000.0000 mg | ORAL_TABLET | Freq: Every day | ORAL | 3 refills | Status: DC

## 2021-10-14 NOTE — Progress Notes (Signed)
Established patient visit   Patient: Kristin Chavez   DOB: 01-11-1971   51 y.o. Female  MRN: [id number] Visit Date: 10/14/2021  Today's healthcare provider: Gwyneth Sprout, FNP   Chief Complaint  Patient presents with   Diabetes   Hypothyroidism   Subjective    HPI  Diabetes Mellitus Type II, Follow-up  Lab Results  Component Value Date   HGBA1C 7.0 (A) 10/14/2021   HGBA1C 6.4 (A) 11/12/2020   HGBA1C 6.3 (A) 05/15/2020   Wt Readings from Last 3 Encounters:  10/14/21 (!) 300 lb 14.4 oz (136.5 kg)  11/12/20 286 lb 9.6 oz (130 kg)  05/15/20 283 lb 3.2 oz (128.5 kg)   Last seen for diabetes 11 months ago.  Management since then includes none continue Metformin. She reports excellent compliance with treatment. She is not having side effects.  Symptoms: Yes fatigue No foot ulcerations  No appetite changes No nausea  No paresthesia of the feet  No polydipsia  No polyuria No visual disturbances   No vomiting     Home blood sugar records:  not being checked  Episodes of hypoglycemia? No    Current insulin regiment: none Most Recent Eye Exam: > 12 months Current exercise: no regular exercise Current diet habits: well balanced  Pertinent Labs: Lab Results  Component Value Date   CHOL 189 02/12/2018   HDL 59 02/12/2018   LDLCALC 105 (H) 02/12/2018   TRIG 123 02/12/2018   CHOLHDL 3.2 02/12/2018   Lab Results  Component Value Date   NA 138 02/13/2020   K 3.9 02/13/2020   CREATININE 0.82 02/13/2020   GFRNONAA 84 02/13/2020     ---------------------------------------------------------------------------------------------------  Hypothyroid, follow-up  Lab Results  Component Value Date   TSH 4.910 (H) 11/12/2020   TSH 7.250 (H) 05/15/2020   TSH 7.460 (H) 04/06/2020   FREET4 0.52 (L) 08/28/2019   T4TOTAL 7.1 11/12/2020   T4TOTAL 6.4 05/15/2020    Wt Readings from Last 3 Encounters:  10/14/21 (!) 300 lb 14.4 oz (136.5 kg)  11/12/20 286 lb 9.6 oz (130  kg)  05/15/20 283 lb 3.2 oz (128.5 kg)    She was last seen for hypothyroid 11 months ago.  Management since that visit includes increasing levothyroxine to 130mg. She reports excellent compliance with treatment. She is not having side effects.   Symptoms: Yes change in energy level No constipation  No diarrhea No heat / cold intolerance  No nervousness No palpitations  No weight changes    -----------------------------------------------------------------------------------------  Hypertension, follow-up  BP Readings from Last 3 Encounters:  10/14/21 (!) 156/76  11/12/20 (!) 145/82  05/15/20 138/89   Wt Readings from Last 3 Encounters:  10/14/21 (!) 300 lb 14.4 oz (136.5 kg)  11/12/20 286 lb 9.6 oz (130 kg)  05/15/20 283 lb 3.2 oz (128.5 kg)     She was last seen for hypertension 1 years  6 months ago.  BP at that visit was 156/90. Management since that visit includes decreased losartan to 228mand adding HCTZ 2560m She reports good compliance with treatment. She is not having side effects.  She is following a Regular diet. She is not exercising. She does not smoke.  Use of agents associated with hypertension: none.   Outside blood pressures are not checked. Symptoms: No chest pain No chest pressure  No palpitations No syncope  No dyspnea No orthopnea  No paroxysmal nocturnal dyspnea No lower extremity edema   Pertinent labs: Lab  Results  Component Value Date   CHOL 189 02/12/2018   HDL 59 02/12/2018   LDLCALC 105 (H) 02/12/2018   TRIG 123 02/12/2018   CHOLHDL 3.2 02/12/2018   Lab Results  Component Value Date   NA 138 02/13/2020   K 3.9 02/13/2020   CREATININE 0.82 02/13/2020   GFRNONAA 84 02/13/2020   GLUCOSE 102 (H) 02/13/2020   TSH 4.910 (H) 11/12/2020     The ASCVD Risk score (Arnett DK, et al., 2019) failed to calculate for the following reasons:   Cannot find a previous HDL lab   Cannot find a previous total cholesterol lab    ---------------------------------------------------------------------------------------------------  Medications: Outpatient Medications Prior to Visit  Medication Sig   blood glucose meter kit and supplies Dispense based on patient and insurance preference. Use up to four times daily as directed. (FOR ICD-10 E10.9, E11.9).   dicyclomine (BENTYL) 20 MG tablet Take 1 tablet (20 mg total) by mouth 3 (three) times daily as needed (for cramps).   levothyroxine (SYNTHROID) 112 MCG tablet Take 1 tablet (112 mcg total) by mouth daily.   [DISCONTINUED] acetaminophen (TYLENOL) 500 MG tablet Take 500 mg by mouth every 6 (six) hours as needed for mild pain or fever.    [DISCONTINUED] hydrochlorothiazide (HYDRODIURIL) 25 MG tablet TAKE 1 TABLET BY MOUTH EVERY DAY   [DISCONTINUED] losartan (COZAAR) 50 MG tablet TAKE 1 TABLET BY MOUTH EVERY DAY   [DISCONTINUED] metFORMIN (GLUCOPHAGE) 500 MG tablet TAKE 1 TABLET BY MOUTH 2 TIMES DAILY WITH A MEAL.   No facility-administered medications prior to visit.    Review of Systems     Objective    BP (!) 156/76    Pulse 90    Resp 16    Wt (!) 300 lb 14.4 oz (136.5 kg)    SpO2 98%    BMI 48.57 kg/m    Physical Exam Vitals and nursing note reviewed.  Constitutional:      General: She is not in acute distress.    Appearance: Normal appearance. She is obese. She is not ill-appearing, toxic-appearing or diaphoretic.  HENT:     Head: Normocephalic and atraumatic.  Cardiovascular:     Rate and Rhythm: Normal rate and regular rhythm.     Pulses: Normal pulses.     Heart sounds: Normal heart sounds. No murmur heard.   No friction rub. No gallop.  Pulmonary:     Effort: Pulmonary effort is normal. No respiratory distress.     Breath sounds: Normal breath sounds. No stridor. No wheezing, rhonchi or rales.  Chest:     Chest wall: No tenderness.  Abdominal:     General: Bowel sounds are normal.     Palpations: Abdomen is soft.  Musculoskeletal:         General: No swelling, tenderness, deformity or signs of injury. Normal range of motion.     Right lower leg: No edema.     Left lower leg: No edema.  Skin:    General: Skin is warm and dry.     Capillary Refill: Capillary refill takes less than 2 seconds.     Coloration: Skin is not jaundiced or pale.     Findings: No bruising, erythema, lesion or rash.  Neurological:     General: No focal deficit present.     Mental Status: She is alert and oriented to person, place, and time. Mental status is at baseline.     Cranial Nerves: No cranial nerve deficit.  Sensory: No sensory deficit.     Motor: No weakness.     Coordination: Coordination normal.  Psychiatric:        Mood and Affect: Mood normal.        Behavior: Behavior normal.        Thought Content: Thought content normal.        Judgment: Judgment normal.     Results for orders placed or performed in visit on 10/14/21  POCT glycosylated hemoglobin (Hb A1C)  Result Value Ref Range   Hemoglobin A1C 7.0 (A) 4.0 - 5.6 %   HbA1c POC (<> result, manual entry)     HbA1c, POC (prediabetic range)     HbA1c, POC (controlled diabetic range)      Assessment & Plan     Problem List Items Addressed This Visit       Cardiovascular and Mediastinum   Hypertension associated with diabetes ([place])    Goal <130/<90      Relevant Medications   losartan-hydrochlorothiazide (HYZAAR) 100-25 MG tablet   metFORMIN (GLUCOPHAGE) 500 MG tablet   Essential hypertension    Elevated; increase ARB OK to use home supply to continue to treat Recommend check at home/return to clinic for accuracy of medication dosage      Relevant Medications   losartan-hydrochlorothiazide (HYZAAR) 100-25 MG tablet     Endocrine   Adult hypothyroidism    Dr Gabriel Carina; no nodules noted at this time Continue to monitor diet and work on decrease in foods that naturally cause inflammation given Hashimoto's Repeat labwork      Relevant Orders   TSH + free T4    Type 2 diabetes mellitus without complication, without long-term current use of insulin ([place]) - Primary    A1c has increased; however, remains stable Continue to recommend balanced, lower carb meals. Smaller meal size, adding snacks. Choosing water as drink of choice and increasing purposeful exercise.       Relevant Medications   losartan-hydrochlorothiazide (HYZAAR) 100-25 MG tablet   metFORMIN (GLUCOPHAGE) 500 MG tablet   Other Relevant Orders   POCT glycosylated hemoglobin (Hb A1C) (Completed)   Comprehensive metabolic panel     Other   Avitaminosis D    Repeat vitamin d level      Relevant Orders   25-Hydroxyvitamin D Lcms D2+D3   Morbid obesity ([place])    Weight increase; does not have any care in relation to her health Discussed how she does not enjoy coming to the [place] and has not had a mammogram or a PAP Discussed reasons for living, ex her kids- she cited that they live outside of the home and they have their own lives ? Depression HTN, HLD, DM BMI 48.57 Discussed importance of healthy weight management Discussed diet and exercise       Relevant Medications   metFORMIN (GLUCOPHAGE) 500 MG tablet   Other Relevant Orders   Comprehensive metabolic panel   Elevated LDL cholesterol level    Repeat lipid levels      Relevant Orders   Lipid panel   Screening mammogram for breast cancer    Discussed nature of the test itself; has never had an exam Denies complications      Relevant Orders   MM 3D SCREEN BREAST BILATERAL   Encounter for hepatitis C screening test for low risk patient    Low risk screen      Relevant Orders   Hepatitis C Antibody   Pap smear of cervix declined  Declines d/t fear of exam and self awareness of body Education provided given hx of abnormal pap and hx of genital warts Reports 1 sexual partner        Return in about 3 months (around 01/12/2022) for chonic disease management.      Vonna Kotyk, FNP, have reviewed all  documentation for this visit. The documentation on 10/14/21 for the exam, diagnosis, procedures, and orders are all accurate and complete.    Gwyneth Sprout, [place] [phone] (phone) [phone] (fax)  [place]

## 2021-10-14 NOTE — Assessment & Plan Note (Signed)
Low risk screen °

## 2021-10-14 NOTE — Assessment & Plan Note (Signed)
Goal <130/90

## 2021-10-14 NOTE — Assessment & Plan Note (Signed)
A1c has increased; however, remains stable Continue to recommend balanced, lower carb meals. Smaller meal size, adding snacks. Choosing water as drink of choice and increasing purposeful exercise.

## 2021-10-14 NOTE — Assessment & Plan Note (Signed)
Weight increase; does not have any care in relation to her health Discussed how she does not enjoy coming to the DR and has not had a mammogram or a PAP Discussed reasons for living, ex her kids- she cited that they live outside of the home and they have their own lives ? Depression HTN, HLD, DM BMI 48.57 Discussed importance of healthy weight management Discussed diet and exercise

## 2021-10-14 NOTE — Assessment & Plan Note (Signed)
Elevated; increase ARB OK to use home supply to continue to treat Recommend check at home/return to clinic for accuracy of medication dosage

## 2021-10-14 NOTE — Assessment & Plan Note (Signed)
Discussed nature of the test itself; has never had an exam Denies complications

## 2021-10-14 NOTE — Assessment & Plan Note (Signed)
Repeat vitamin d level

## 2021-10-14 NOTE — Assessment & Plan Note (Signed)
Dr Tedd Sias; no nodules noted at this time Continue to monitor diet and work on decrease in foods that naturally cause inflammation given Hashimoto's Repeat labwork

## 2021-10-14 NOTE — Assessment & Plan Note (Signed)
Repeat lipid levels

## 2021-10-14 NOTE — Assessment & Plan Note (Signed)
Declines d/t fear of exam and self awareness of body Education provided given hx of abnormal pap and hx of genital warts Reports 1 sexual partner

## 2021-10-16 ENCOUNTER — Encounter: Payer: Self-pay | Admitting: Family Medicine

## 2021-10-16 DIAGNOSIS — E119 Type 2 diabetes mellitus without complications: Secondary | ICD-10-CM

## 2021-10-18 ENCOUNTER — Other Ambulatory Visit: Payer: Self-pay | Admitting: Family Medicine

## 2021-10-18 DIAGNOSIS — E039 Hypothyroidism, unspecified: Secondary | ICD-10-CM

## 2021-10-18 MED ORDER — LEVOTHYROXINE SODIUM 50 MCG PO TABS: 25.0000 ug | ORAL_TABLET | Freq: Every day | ORAL | 0 refills | Status: DC

## 2021-10-20 ENCOUNTER — Other Ambulatory Visit: Payer: Self-pay | Admitting: Family Medicine

## 2021-10-20 DIAGNOSIS — I10 Essential (primary) hypertension: Secondary | ICD-10-CM

## 2021-10-20 LAB — 25-HYDROXY VITAMIN D LCMS D2+D3
25-Hydroxy, Vitamin D-2: 6.2 ng/mL
25-Hydroxy, Vitamin D-3: 20 ng/mL
25-Hydroxy, Vitamin D: 26 ng/mL — ABNORMAL LOW

## 2021-10-20 LAB — LIPID PANEL
Chol/HDL Ratio: 3.1 ratio (ref 0.0–4.4)
Cholesterol, Total: 185 mg/dL (ref 100–199)
HDL: 60 mg/dL (ref >39)
LDL Chol Calc (NIH): 101 mg/dL — ABNORMAL HIGH (ref 0–99)
Triglycerides: 136 mg/dL (ref 0–149)
VLDL Cholesterol Cal: 24 mg/dL (ref 5–40)

## 2021-10-20 LAB — COMPREHENSIVE METABOLIC PANEL
ALT: 14 IU/L (ref 0–32)
AST: 18 IU/L (ref 0–40)
Albumin/Globulin Ratio: 1.4 (ref 1.2–2.2)
Albumin: 4.1 g/dL (ref 3.8–4.9)
Alkaline Phosphatase: 61 IU/L (ref 44–121)
BUN/Creatinine Ratio: 24 — ABNORMAL HIGH (ref 9–23)
BUN: 17 mg/dL (ref 6–24)
Bilirubin Total: 0.2 mg/dL (ref 0.0–1.2)
CO2: 25 mmol/L (ref 20–29)
Calcium: 9.6 mg/dL (ref 8.7–10.2)
Chloride: 100 mmol/L (ref 96–106)
Creatinine, Ser: 0.7 mg/dL (ref 0.57–1.00)
Globulin, Total: 2.9 g/dL (ref 1.5–4.5)
Glucose: 173 mg/dL — ABNORMAL HIGH (ref 70–99)
Potassium: 4.3 mmol/L (ref 3.5–5.2)
Sodium: 138 mmol/L (ref 134–144)
Total Protein: 7 g/dL (ref 6.0–8.5)
eGFR: 105 mL/min/{1.73_m2} (ref >59)

## 2021-10-20 LAB — TSH+FREE T4
Free T4: 1.06 ng/dL (ref 0.82–1.77)
TSH: 7.59 u[IU]/mL — ABNORMAL HIGH (ref 0.450–4.500)

## 2021-10-20 LAB — HEPATITIS C ANTIBODY: Hep C Virus Ab: <0.1 s/co ratio (ref 0.0–0.9)

## 2021-11-03 ENCOUNTER — Other Ambulatory Visit: Payer: Self-pay | Admitting: Family Medicine

## 2021-11-03 NOTE — Telephone Encounter (Signed)
Per office note- #[id number] 3 RF sent- but marked no print Approved requested 90 day Requested Prescriptions  Pending Prescriptions Disp Refills   metFORMIN (GLUCOPHAGE) 500 MG tablet [Pharmacy Med Name: METFORMIN HCL 500 MG TABLET] 180 tablet 1    Sig: TAKE 2 TABLETS BY [facility]     Endocrinology:  Diabetes - Biguanides Passed - 11/03/2021  9:33 AM      Passed - Cr in normal range and within 360 days    Creatinine, Ser  Date Value Ref Range Status  10/14/2021 0.70 0.57 - 1.00 mg/dL Final         Passed - HBA1C is between 0 and 7.9 and within 180 days    Hemoglobin A1C  Date Value Ref Range Status  10/14/2021 7.0 (A) 4.0 - 5.6 % Final   Hgb A1c MFr Bld  Date Value Ref Range Status  02/13/2020 6.5 (H) 4.8 - 5.6 % Final    Comment:             Prediabetes: 5.7 - 6.4          Diabetes: >6.4          Glycemic control for adults with diabetes: <7.0          Passed - eGFR in normal range and within 360 days    GFR calc Af Amer  Date Value Ref Range Status  02/13/2020 97 >59 mL/min/1.73 Final    Comment:    **[facility] currently reports eGFR in compliance with the current**   recommendations of the [employer]. [facility] will   update reporting as new guidelines are published from the NKF-ASN   Task force.    GFR calc non Af Amer  Date Value Ref Range Status  02/13/2020 84 >59 mL/min/1.73 Final   eGFR  Date Value Ref Range Status  10/14/2021 105 >59 mL/min/1.73 Final         Passed - Valid encounter within last 6 months    Recent Outpatient Visits          2 weeks ago Type 2 diabetes mellitus without complication, without long-term current use of insulin [facility])   [facility] Tally Joe T, FNP   11 months ago Type 2 diabetes mellitus without complication, without long-term current use of insulin [facility])   [facility] [place], [place], [place]   1 year ago Type 2 diabetes mellitus without complication, without  long-term current use of insulin [facility])   [facility] [place], [place], [place]   1 year ago Essential hypertension   [place], [place], [place]   1 year ago Adult hypothyroidism   [place], [place], [place]

## 2021-11-17 ENCOUNTER — Other Ambulatory Visit: Payer: Self-pay | Admitting: Physician Assistant

## 2021-11-17 DIAGNOSIS — E034 Atrophy of thyroid (acquired): Secondary | ICD-10-CM

## 2021-12-24 ENCOUNTER — Encounter: Payer: Self-pay | Admitting: Family Medicine

## 2021-12-24 DIAGNOSIS — E034 Atrophy of thyroid (acquired): Secondary | ICD-10-CM

## 2021-12-24 DIAGNOSIS — E039 Hypothyroidism, unspecified: Secondary | ICD-10-CM

## 2021-12-24 MED ORDER — LEVOTHYROXINE SODIUM 50 MCG PO TABS: 25.0000 ug | ORAL_TABLET | Freq: Every day | ORAL | 0 refills | Status: DC

## 2021-12-24 MED ORDER — LEVOTHYROXINE SODIUM 112 MCG PO TABS: 112.0000 ug | ORAL_TABLET | Freq: Every day | ORAL | 0 refills | Status: DC

## 2022-05-12 ENCOUNTER — Telehealth: Payer: Self-pay

## 2022-05-12 NOTE — Telephone Encounter (Signed)
Patient is due for 6 months follow-up. Called and scheduled patient.

## 2022-05-12 NOTE — Telephone Encounter (Signed)
Copied from CRM [id number]. Topic: General - Inquiry >> May 12, 2022  8:44 AM De Blanch wrote: Reason for CRM: Pt is requesting labs to check A1C and Thyroid.No orders. Pt is requesting a call back to schedule labs. Pt stated if PCP needs to see her, she can schedule an office visit instead.   Please advise.

## 2022-05-16 NOTE — Progress Notes (Unsigned)
I,Sulibeya S Dimas,acting as a Education administrator for Gwyneth Sprout, FNP.,have documented all relevant documentation on the behalf of Gwyneth Sprout, FNP,as directed by  Gwyneth Sprout, FNP while in the presence of Gwyneth Sprout, FNP.   Established patient visit   Patient: Kristin Chavez   DOB: 01-04-1971   51 y.o. Female  MRN: [id number] Visit Date: 05/17/2022  Today's healthcare provider: Gwyneth Sprout, FNP  Introduced to nurse practitioner role and practice setting.  All questions answered.  Discussed provider/patient relationship and expectations.   Chief Complaint  Patient presents with   Diabetes   Hypertension   Hypothyroidism   Rash   Subjective    Rash This is a recurrent problem. The current episode started more than 1 month ago. The problem is unchanged. The affected locations include the back, left arm and right arm. The rash is characterized by redness and itchiness. She was exposed to nothing. Associated symptoms include diarrhea and fatigue. Pertinent negatives include no shortness of breath or vomiting. Past treatments include antihistamine and anti-itch cream. The treatment provided mild relief.    Hypothyroid, follow-up  Lab Results  Component Value Date   TSH 7.590 (H) 10/14/2021   TSH 4.910 (H) 11/12/2020   TSH 7.250 (H) 05/15/2020   FREET4 1.06 10/14/2021   FREET4 0.52 (L) 08/28/2019   T4TOTAL 7.1 11/12/2020   T4TOTAL 6.4 05/15/2020    Wt Readings from Last 3 Encounters:  05/17/22 (!) 304 lb (137.9 kg)  10/14/21 (!) 300 lb 14.4 oz (136.5 kg)  11/12/20 286 lb 9.6 oz (130 kg)    She was last seen for hypothyroid 6 months ago.  Management since that visit includes continue medication. She reports excellent compliance with treatment. She is not having side effects.   Symptoms: Yes change in energy level Yes constipation  Yes diarrhea No heat / cold intolerance  No nervousness No palpitations  No weight changes     -----------------------------------------------------------------------------------------  Diabetes Mellitus Type II, Follow-up  Lab Results  Component Value Date   HGBA1C 7.2 (A) 05/17/2022   HGBA1C 7.0 (A) 10/14/2021   HGBA1C 6.4 (A) 11/12/2020   Wt Readings from Last 3 Encounters:  05/17/22 (!) 304 lb (137.9 kg)  10/14/21 (!) 300 lb 14.4 oz (136.5 kg)  11/12/20 286 lb 9.6 oz (130 kg)   Last seen for diabetes 6 months ago.  Management since then includes continue medication. She reports excellent compliance with treatment. She is not having side effects.  Symptoms: Yes fatigue No foot ulcerations  No appetite changes No nausea  No paresthesia of the feet  No polydipsia  No polyuria No visual disturbances   No vomiting     Home blood sugar records:  not being checked  Episodes of hypoglycemia? No    Current insulin regiment: none Most Recent Eye Exam:  Current exercise: none Current diet habits: in general, an "unhealthy" diet  Pertinent Labs: Lab Results  Component Value Date   CHOL 185 10/14/2021   HDL 60 10/14/2021   LDLCALC 101 (H) 10/14/2021   TRIG 136 10/14/2021   CHOLHDL 3.1 10/14/2021   Lab Results  Component Value Date   NA 138 10/14/2021   K 4.3 10/14/2021   CREATININE 0.70 10/14/2021   EGFR 105 10/14/2021     ---------------------------------------------------------------------------------------------------  Hypertension, follow-up  BP Readings from Last 3 Encounters:  05/17/22 (!) 172/77  10/14/21 (!) 156/76  11/12/20 (!) 145/82   Wt Readings from Last 3 Encounters:  05/17/22 (!) 304 lb (137.9 kg)  10/14/21 (!) 300 lb 14.4 oz (136.5 kg)  11/12/20 286 lb 9.6 oz (130 kg)     She was last seen for hypertension 6 months ago.  BP at that visit was 156/76. Management since that visit includes continue medication.  She reports excellent compliance with treatment. She is not having side effects.  She is following a Regular diet. She is  not exercising. She does not smoke.  Use of agents associated with hypertension: none.   Outside blood pressures are not being checked. Symptoms: No chest pain No chest pressure  No palpitations No syncope  No dyspnea No orthopnea  No paroxysmal nocturnal dyspnea No lower extremity edema   Pertinent labs Lab Results  Component Value Date   CHOL 185 10/14/2021   HDL 60 10/14/2021   LDLCALC 101 (H) 10/14/2021   TRIG 136 10/14/2021   CHOLHDL 3.1 10/14/2021   Lab Results  Component Value Date   NA 138 10/14/2021   K 4.3 10/14/2021   CREATININE 0.70 10/14/2021   EGFR 105 10/14/2021   GLUCOSE 173 (H) 10/14/2021   TSH 7.590 (H) 10/14/2021     The ASCVD Risk score (Arnett DK, et al., 2019) failed to calculate for the following reasons:   The systolic blood pressure is missing  ---------------------------------------------------------------------------------------------------   Medications: Outpatient Medications Prior to Visit  Medication Sig   blood glucose meter kit and supplies Dispense based on patient and insurance preference. Use up to four times daily as directed. (FOR ICD-10 E10.9, E11.9).   dicyclomine (BENTYL) 20 MG tablet Take 1 tablet (20 mg total) by mouth 3 (three) times daily as needed (for cramps).   levothyroxine (SYNTHROID) 112 MCG tablet Take 1 tablet (112 mcg total) by mouth daily.   levothyroxine (SYNTHROID) 50 MCG tablet Take 0.5 tablets (25 mcg total) by mouth daily before breakfast. Take in additional to 112 mcg.   losartan-hydrochlorothiazide (HYZAAR) 100-25 MG tablet Take 1 tablet by mouth daily.   metFORMIN (GLUCOPHAGE) 500 MG tablet TAKE 2 TABLETS BY MOUTH EVERY DAY WITH BREAKFAST   No facility-administered medications prior to visit.    Review of Systems  Constitutional:  Positive for fatigue. Negative for appetite change.  Eyes:  Negative for visual disturbance.  Respiratory:  Negative for chest tightness and shortness of breath.    Cardiovascular:  Negative for chest pain, palpitations and leg swelling.  Gastrointestinal:  Positive for constipation and diarrhea. Negative for abdominal pain and vomiting.  Skin:  Positive for rash.        Objective    BP (!) 172/77 (BP Location: Right Arm, Patient Position: Sitting, Cuff Size: Large)   Pulse 83   Temp 98.8 F (37.1 C) (Oral)   Resp 16   Ht 5\' 7"  (1.702 m)   Wt (!) 304 lb (137.9 kg)   LMP 04/26/2022 (Approximate)   SpO2 98%   BMI 47.61 kg/m   BP Readings from Last 3 Encounters:  05/17/22 (!) 172/77  10/14/21 (!) 156/76  11/12/20 (!) 145/82   Wt Readings from Last 3 Encounters:  05/17/22 (!) 304 lb (137.9 kg)  10/14/21 (!) 300 lb 14.4 oz (136.5 kg)  11/12/20 286 lb 9.6 oz (130 kg)      Physical Exam Vitals and nursing note reviewed.  Constitutional:      General: She is not in acute distress.    Appearance: Normal appearance. She is obese. She is not ill-appearing, toxic-appearing or diaphoretic.  HENT:  Head: Normocephalic and atraumatic.  Cardiovascular:     Rate and Rhythm: Normal rate and regular rhythm.     Pulses: Normal pulses.     Heart sounds: Normal heart sounds. No murmur heard.    No friction rub. No gallop.  Pulmonary:     Effort: Pulmonary effort is normal. No respiratory distress.     Breath sounds: Normal breath sounds. No stridor. No wheezing, rhonchi or rales.  Chest:     Chest wall: No tenderness.  Abdominal:     General: Bowel sounds are normal.     Palpations: Abdomen is soft.  Musculoskeletal:        General: No swelling, tenderness, deformity or signs of injury. Normal range of motion.     Right lower leg: No edema.     Left lower leg: No edema.  Skin:    General: Skin is warm and dry.     Capillary Refill: Capillary refill takes less than 2 seconds.     Coloration: Skin is not jaundiced or pale.     Findings: Rash present. No bruising, erythema or lesion.          Comments: 2 month rash on bilateral arms,  and back Denies new foods, products, Rx or detergents   Neurological:     General: No focal deficit present.     Mental Status: She is alert and oriented to person, place, and time. Mental status is at baseline.     Cranial Nerves: No cranial nerve deficit.     Sensory: No sensory deficit.     Motor: No weakness.     Coordination: Coordination normal.  Psychiatric:        Mood and Affect: Mood normal.        Behavior: Behavior normal.        Thought Content: Thought content normal.        Judgment: Judgment normal.      Results for orders placed or performed in visit on 05/17/22  POCT glycosylated hemoglobin (Hb A1C)  Result Value Ref Range   Hemoglobin A1C 7.2 (A) 4.0 - 5.6 %   Est. average glucose Bld gHb Est-mCnc 160     Assessment & Plan   Problem List Items Addressed This Visit       Cardiovascular and Mediastinum   Hypertension associated with diabetes ([place])    Chronic, elevated Recommend addition of Norvasc and 1 month f/u to ensure BP is at goal Encourage home checks in addition to DASH diet and exercise      Relevant Medications   amLODipine (NORVASC) 5 MG tablet   tirzepatide (MOUNJARO) 2.5 MG/0.5ML Pen   Other Relevant Orders   Comprehensive metabolic panel     Endocrine   Hyperlipidemia associated with type 2 diabetes mellitus (HCC)    Chronic, previously elevated above goal of 70 Repeat LP recommend diet low in saturated fat and regular exercise - 30 min at least 5 times per week       Relevant Medications   amLODipine (NORVASC) 5 MG tablet   tirzepatide (MOUNJARO) 2.5 MG/0.5ML Pen   Other Relevant Orders   Lipid panel   Hypothyroidism due to acquired atrophy of thyroid    Chronic, previously stable Endorses fatigue/depression and slight weight gain Requests additional labs; wonders if thyroid is contributing to rash       Relevant Orders   TSH+T4F+T3Free   Type 2 diabetes mellitus with hyperglycemia, without long-term current use of insulin  ([place])  Chronic, elevated Continue to recommend balanced, lower carb meals. Smaller meal size, adding snacks. Choosing water as drink of choice and increasing purposeful exercise. Trial of mounjaro to assist with A1c and BMI      Relevant Medications   tirzepatide (MOUNJARO) 2.5 MG/0.5ML Pen   Other Relevant Orders   POCT glycosylated hemoglobin (Hb A1C) (Completed)     Musculoskeletal and Integument   Chronic idiopathic urticaria    Chronic, stable Reports rash for 2 months on arms; reports 1 month on rash Has tried hydrocortisone with slight improvement; variable with itching       Relevant Medications   famotidine (PEPCID) 20 MG tablet   cetirizine (ZYRTEC) 10 MG tablet   predniSONE (DELTASONE) 10 MG tablet   triamcinolone ointment (KENALOG) 0.1 %     Other   Avitaminosis D    Chronic, previously low Recommend repeat labs with complaints of fatigue and difficulty losing weight to <299#      Relevant Orders   Vitamin D (25 hydroxy)   Morbid obesity (HCC)    Chronic, stable Body mass index is 47.61 kg/m. Discussed importance of healthy weight management Discussed diet and exercise       Relevant Medications   tirzepatide (MOUNJARO) 2.5 MG/0.5ML Pen     Return in about 4 weeks (around 06/14/2022) for HTN management.      Vonna Kotyk, FNP, have reviewed all documentation for this visit. The documentation on 05/17/22 for the exam, diagnosis, procedures, and orders are all accurate and complete.    Gwyneth Sprout, [place] [phone] (phone) [phone] (fax)  [place]

## 2022-05-17 ENCOUNTER — Encounter: Payer: Self-pay | Admitting: Family Medicine

## 2022-05-17 ENCOUNTER — Ambulatory Visit: Payer: BC Managed Care – PPO | Admitting: Family Medicine

## 2022-05-17 DIAGNOSIS — E785 Hyperlipidemia, unspecified: Secondary | ICD-10-CM

## 2022-05-17 DIAGNOSIS — E034 Atrophy of thyroid (acquired): Secondary | ICD-10-CM

## 2022-05-17 DIAGNOSIS — L501 Idiopathic urticaria: Secondary | ICD-10-CM

## 2022-05-17 DIAGNOSIS — I152 Hypertension secondary to endocrine disorders: Secondary | ICD-10-CM | POA: Diagnosis not present

## 2022-05-17 DIAGNOSIS — E1165 Type 2 diabetes mellitus with hyperglycemia: Secondary | ICD-10-CM | POA: Diagnosis not present

## 2022-05-17 DIAGNOSIS — E1159 Type 2 diabetes mellitus with other circulatory complications: Secondary | ICD-10-CM | POA: Diagnosis not present

## 2022-05-17 DIAGNOSIS — E559 Vitamin D deficiency, unspecified: Secondary | ICD-10-CM

## 2022-05-17 DIAGNOSIS — E1169 Type 2 diabetes mellitus with other specified complication: Secondary | ICD-10-CM | POA: Diagnosis not present

## 2022-05-17 LAB — POCT GLYCOSYLATED HEMOGLOBIN (HGB A1C)
Est. average glucose Bld gHb Est-mCnc: 160
Hemoglobin A1C: 7.2 % — AB (ref 4.0–5.6)

## 2022-05-17 MED ORDER — TRIAMCINOLONE ACETONIDE 0.1 % EX OINT: TOPICAL_OINTMENT | Freq: Two times a day (BID) | CUTANEOUS | 11 refills | Status: DC

## 2022-05-17 MED ORDER — CETIRIZINE HCL 10 MG PO TABS: 10.0000 mg | ORAL_TABLET | Freq: Every day | ORAL | 11 refills | Status: DC

## 2022-05-17 MED ORDER — TIRZEPATIDE 2.5 MG/0.5ML ~~LOC~~ SOAJ: 2.5000 mg | SUBCUTANEOUS | 0 refills | Status: DC

## 2022-05-17 MED ORDER — PREDNISONE 10 MG PO TABS: ORAL_TABLET | ORAL | 0 refills | Status: DC

## 2022-05-17 MED ORDER — FAMOTIDINE 20 MG PO TABS: 20.0000 mg | ORAL_TABLET | Freq: Every day | ORAL | 11 refills | Status: DC

## 2022-05-17 MED ORDER — AMLODIPINE BESYLATE 5 MG PO TABS: 5.0000 mg | ORAL_TABLET | Freq: Every day | ORAL | 3 refills | Status: DC

## 2022-05-17 NOTE — Assessment & Plan Note (Signed)
Chronic, elevated Continue to recommend balanced, lower carb meals. Smaller meal size, adding snacks. Choosing water as drink of choice and increasing purposeful exercise. Trial of mounjaro to assist with A1c and BMI

## 2022-05-17 NOTE — Assessment & Plan Note (Signed)
Chronic, elevated >7% Continue to recommend balanced, lower carb meals. Smaller meal size, adding snacks. Choosing water as drink of choice and increasing purposeful exercise. Recommend addition of mounjaro with titration to assist with A1c and morbid obesity

## 2022-05-17 NOTE — Patient Instructions (Addendum)
Please call and schedule your mammogram:  [facility]  [address], [address] [facility] [place],  [place]  [address] Get Driving Directions Main: [phone]  Sunday:Closed Monday:7:20 AM - 5:00 PM Tuesday:7:20 AM - 5:00 PM Wednesday:7:20 AM - 5:00 PM Thursday:7:20 AM - 5:00 PM Friday:7:20 AM - 4:30 PM Saturday:Closed  

## 2022-05-17 NOTE — Assessment & Plan Note (Signed)
Chronic, stable Body mass index is 47.61 kg/m. Discussed importance of healthy weight management Discussed diet and exercise

## 2022-05-17 NOTE — Assessment & Plan Note (Signed)
Chronic, previously stable Endorses fatigue/depression and slight weight gain Requests additional labs; wonders if thyroid is contributing to rash

## 2022-05-17 NOTE — Assessment & Plan Note (Signed)
Chronic, stable Reports rash for 2 months on arms; reports 1 month on rash Has tried hydrocortisone with slight improvement; variable with itching

## 2022-05-17 NOTE — Assessment & Plan Note (Signed)
Chronic, previously low Recommend repeat labs with complaints of fatigue and difficulty losing weight to <299#

## 2022-05-17 NOTE — Assessment & Plan Note (Signed)
Chronic, previously elevated above goal of 70 Repeat LP recommend diet low in saturated fat and regular exercise - 30 min at least 5 times per week

## 2022-05-17 NOTE — Assessment & Plan Note (Signed)
Chronic, elevated Recommend addition of Norvasc and 1 month f/u to ensure BP is at goal Encourage home checks in addition to DASH diet and exercise

## 2022-05-18 ENCOUNTER — Encounter: Payer: Self-pay | Admitting: Family Medicine

## 2022-05-18 ENCOUNTER — Other Ambulatory Visit: Payer: Self-pay | Admitting: Family Medicine

## 2022-05-18 DIAGNOSIS — E034 Atrophy of thyroid (acquired): Secondary | ICD-10-CM

## 2022-05-18 DIAGNOSIS — E1165 Type 2 diabetes mellitus with hyperglycemia: Secondary | ICD-10-CM

## 2022-05-18 DIAGNOSIS — E559 Vitamin D deficiency, unspecified: Secondary | ICD-10-CM

## 2022-05-18 LAB — VITAMIN D 25 HYDROXY (VIT D DEFICIENCY, FRACTURES): Vit D, 25-Hydroxy: 19.9 ng/mL — ABNORMAL LOW (ref 30.0–100.0)

## 2022-05-18 LAB — COMPREHENSIVE METABOLIC PANEL
ALT: 9 IU/L (ref 0–32)
AST: 12 IU/L (ref 0–40)
Albumin/Globulin Ratio: 1.4 (ref 1.2–2.2)
Albumin: 4.2 g/dL (ref 3.8–4.9)
Alkaline Phosphatase: 62 IU/L (ref 44–121)
BUN/Creatinine Ratio: 17 (ref 9–23)
BUN: 13 mg/dL (ref 6–24)
Bilirubin Total: 0.3 mg/dL (ref 0.0–1.2)
CO2: 22 mmol/L (ref 20–29)
Calcium: 9.8 mg/dL (ref 8.7–10.2)
Chloride: 100 mmol/L (ref 96–106)
Creatinine, Ser: 0.75 mg/dL (ref 0.57–1.00)
Globulin, Total: 3.1 g/dL (ref 1.5–4.5)
Glucose: 161 mg/dL — ABNORMAL HIGH (ref 70–99)
Potassium: 4.6 mmol/L (ref 3.5–5.2)
Sodium: 139 mmol/L (ref 134–144)
Total Protein: 7.3 g/dL (ref 6.0–8.5)
eGFR: 96 mL/min/{1.73_m2} (ref >59)

## 2022-05-18 LAB — LIPID PANEL
Chol/HDL Ratio: 3.6 ratio (ref 0.0–4.4)
Cholesterol, Total: 179 mg/dL (ref 100–199)
HDL: 50 mg/dL (ref >39)
LDL Chol Calc (NIH): 99 mg/dL (ref 0–99)
Triglycerides: 171 mg/dL — ABNORMAL HIGH (ref 0–149)
VLDL Cholesterol Cal: 30 mg/dL (ref 5–40)

## 2022-05-18 LAB — TSH+T4F+T3FREE
Free T4: 1.26 ng/dL (ref 0.82–1.77)
T3, Free: 2.2 pg/mL (ref 2.0–4.4)
TSH: 7.29 u[IU]/mL — ABNORMAL HIGH (ref 0.450–4.500)

## 2022-05-18 MED ORDER — VITAMIN D (ERGOCALCIFEROL) 1.25 MG (50000 UNIT) PO CAPS: 50000.0000 [IU] | ORAL_CAPSULE | ORAL | 0 refills | Status: DC

## 2022-05-18 MED ORDER — LEVOTHYROXINE SODIUM 150 MCG PO TABS: 150.0000 ug | ORAL_TABLET | Freq: Every day | ORAL | 0 refills | Status: DC

## 2022-05-18 NOTE — Progress Notes (Signed)
Hi Myrel,  It was a pleasure to see you in the office the other day.  Vit D remains low; continue to recommend high dose Rx supplement with repeat labs after 6 months of use. OK to use OTC fish oil at this time, 2 gram dosing twice daily with meals.  Cholesterol is stable; LDL is slightly improved. Ideal goal for is <55 for best stroke/heart attack prevention.   Thyroid remains under treated; recommend dose increase to assist. Will send in new Rx and follow up labs should be done in 6-8 weeks after starting.  Jacky Kindle, FNP  [facility] [address] [place], [place] [address] [phone] (phone) [phone] (fax) [facility]

## 2022-05-25 MED ORDER — GLUCOSE BLOOD VI STRP: ORAL_STRIP | 12 refills | Status: DC

## 2022-06-10 ENCOUNTER — Ambulatory Visit
Admission: RE | Admit: 2022-06-10 | Discharge: 2022-06-10 | Disposition: A | Discharge status: Home / Self Care | Payer: BC Managed Care – PPO | Source: Ambulatory Visit | Attending: Family Medicine | Admitting: Family Medicine

## 2022-06-10 DIAGNOSIS — Z1231 Encounter for screening mammogram for malignant neoplasm of breast: Secondary | ICD-10-CM | POA: Diagnosis not present

## 2022-06-15 ENCOUNTER — Other Ambulatory Visit: Payer: Self-pay | Admitting: Family Medicine

## 2022-06-15 ENCOUNTER — Other Ambulatory Visit: Payer: Self-pay

## 2022-06-15 DIAGNOSIS — E1169 Type 2 diabetes mellitus with other specified complication: Secondary | ICD-10-CM

## 2022-06-15 DIAGNOSIS — E1165 Type 2 diabetes mellitus with hyperglycemia: Secondary | ICD-10-CM

## 2022-06-15 DIAGNOSIS — E1159 Type 2 diabetes mellitus with other circulatory complications: Secondary | ICD-10-CM

## 2022-06-15 MED ORDER — TIRZEPATIDE 5 MG/0.5ML ~~LOC~~ SOAJ: 5.0000 mg | SUBCUTANEOUS | 0 refills | Status: DC

## 2022-06-15 MED ORDER — BLOOD GLUCOSE METER KIT: PACK | 0 refills | Status: DC

## 2022-06-15 NOTE — Progress Notes (Signed)
Hi Kristin Chavez  Normal mammogram; repeat in 1 year.  Please let us know if you have any questions.  Thank you,  Merita Norton, FNP

## 2022-06-17 ENCOUNTER — Encounter: Payer: Self-pay | Admitting: Family Medicine

## 2022-06-17 ENCOUNTER — Ambulatory Visit: Payer: BC Managed Care – PPO | Admitting: Family Medicine

## 2022-06-17 DIAGNOSIS — E1165 Type 2 diabetes mellitus with hyperglycemia: Secondary | ICD-10-CM

## 2022-06-17 DIAGNOSIS — Z23 Encounter for immunization: Secondary | ICD-10-CM | POA: Insufficient documentation

## 2022-06-17 DIAGNOSIS — I152 Hypertension secondary to endocrine disorders: Secondary | ICD-10-CM

## 2022-06-17 DIAGNOSIS — E1159 Type 2 diabetes mellitus with other circulatory complications: Secondary | ICD-10-CM

## 2022-06-17 DIAGNOSIS — E034 Atrophy of thyroid (acquired): Secondary | ICD-10-CM

## 2022-06-17 NOTE — Assessment & Plan Note (Signed)
Chronic, improved/at goal Goal of <130/<80 Continue Norvasc 5 mg with Hyzaar 100-25 mg

## 2022-06-17 NOTE — Progress Notes (Signed)
Established patient visit  Patient: Kristin Chavez   DOB: January 27, 1971   51 y.o. Female  MRN: [id number] Visit Date: 06/17/2022  Today's healthcare provider: Jacky Kindle, FNP  RE Introduced to nurse practitioner role and practice setting.  All questions answered.  Discussed provider/patient relationship and expectations.  I,Tiffany J Bragg,acting as a scribe for Jacky Kindle, FNP.,have documented all relevant documentation on the behalf of Jacky Kindle, FNP,as directed by  Jacky Kindle, FNP while in the presence of Jacky Kindle, FNP.  Chief Complaint  Patient presents with   Hypertension   Rash    Had a rash on chest and arm since last visit that is clearing up.    Subjective    HPI HPI     Rash    Additional comments: Had a rash on chest and arm since last visit that is clearing up.       Last edited by Marlana Salvage, CMA on 06/17/2022  2:42 PM.      Hypertension, follow-up  BP Readings from Last 3 Encounters:  06/17/22 128/80  05/17/22 (!) 172/77  10/14/21 (!) 156/76   Wt Readings from Last 3 Encounters:  06/17/22 289 lb (131.1 kg)  05/17/22 (!) 304 lb (137.9 kg)  10/14/21 (!) 300 lb 14.4 oz (136.5 kg)     She was last seen for hypertension 1 months ago.  BP at that visit was 172/77. Management since that visit includes norvasc was added.  She reports excellent compliance with treatment. She is not having side effects.  She is following a Regular diet. She is not exercising. She does not smoke.  Use of agents associated with hypertension: steroids and thyroid hormones.   Outside blood pressures are around 140/82. Symptoms: No chest pain No chest pressure  No palpitations No syncope  No dyspnea No orthopnea  No paroxysmal nocturnal dyspnea No lower extremity edema   Pertinent labs Lab Results  Component Value Date   CHOL 179 05/17/2022   HDL 50 05/17/2022   LDLCALC 99 05/17/2022   TRIG 171 (H) 05/17/2022   CHOLHDL 3.6 05/17/2022   Lab  Results  Component Value Date   NA 139 05/17/2022   K 4.6 05/17/2022   CREATININE 0.75 05/17/2022   EGFR 96 05/17/2022   GLUCOSE 161 (H) 05/17/2022   TSH 7.290 (H) 05/17/2022     The 10-year ASCVD risk score (Arnett DK, et al., 2019) is: 3.7%  ---------------------------------------------------------------------------------------------------   Medications: Outpatient Medications Prior to Visit  Medication Sig   amLODipine (NORVASC) 5 MG tablet Take 1 tablet (5 mg total) by mouth daily. Take in addition to other BP medication, Hyzaar (Losartan-HCTZ)   blood glucose meter kit and supplies Dispense based on patient and insurance preference. Use up to four times daily as directed. (FOR ICD-10 E10.9, E11.9).   cetirizine (ZYRTEC) 10 MG tablet Take 1 tablet (10 mg total) by mouth at bedtime.   dicyclomine (BENTYL) 20 MG tablet Take 1 tablet (20 mg total) by mouth 3 (three) times daily as needed (for cramps).   famotidine (PEPCID) 20 MG tablet Take 1 tablet (20 mg total) by mouth at bedtime.   glucose blood test strip Use as instructed   levothyroxine (SYNTHROID) 150 MCG tablet Take 1 tablet (150 mcg total) by mouth daily. Repeat labs prior to completion.   losartan-hydrochlorothiazide (HYZAAR) 100-25 MG tablet Take 1 tablet by mouth daily.   metFORMIN (GLUCOPHAGE) 500 MG tablet TAKE 2 TABLETS BY MOUTH  EVERY DAY WITH BREAKFAST   predniSONE (DELTASONE) 10 MG tablet Day 1 & 2 take 6 tablets Day 3 &4 take 5 tablets Day 5 &6 take 4 tablets Day 7 & 8 take 3 tablets Day 9 & 10 take 2 tablets Day 11 & 12 take 1 tablet Day 13 & 14 take 1/2 tablet   tirzepatide (MOUNJARO) 5 MG/0.5ML Pen Inject 5 mg into the skin once a week.   triamcinolone ointment (KENALOG) 0.1 % Apply topically 2 (two) times daily.   Vitamin D, Ergocalciferol, (DRISDOL) 1.25 MG (50000 UNIT) CAPS capsule Take 1 capsule (50,000 Units total) by mouth every 7 (seven) days. Repeat labs in 6 months; best taken with meal.   No  facility-administered medications prior to visit.    Review of Systems  Last CBC Lab Results  Component Value Date   WBC 8.3 02/13/2020   HGB 12.0 02/13/2020   HCT 36.9 02/13/2020   MCV 89 02/13/2020   MCH 28.8 02/13/2020   RDW 14.4 02/13/2020   PLT 307 19/14/7829   Last metabolic panel Lab Results  Component Value Date   GLUCOSE 161 (H) 05/17/2022   NA 139 05/17/2022   K 4.6 05/17/2022   CL 100 05/17/2022   CO2 22 05/17/2022   BUN 13 05/17/2022   CREATININE 0.75 05/17/2022   EGFR 96 05/17/2022   CALCIUM 9.8 05/17/2022   PROT 7.3 05/17/2022   ALBUMIN 4.2 05/17/2022   LABGLOB 3.1 05/17/2022   AGRATIO 1.4 05/17/2022   BILITOT 0.3 05/17/2022   ALKPHOS 62 05/17/2022   AST 12 05/17/2022   ALT 9 05/17/2022   ANIONGAP 11 08/31/2019   Last lipids Lab Results  Component Value Date   CHOL 179 05/17/2022   HDL 50 05/17/2022   LDLCALC 99 05/17/2022   TRIG 171 (H) 05/17/2022   CHOLHDL 3.6 05/17/2022   Last hemoglobin A1c Lab Results  Component Value Date   HGBA1C 7.2 (A) 05/17/2022   Last thyroid functions Lab Results  Component Value Date   TSH 7.290 (H) 05/17/2022   T4TOTAL 7.1 11/12/2020   Last vitamin D Lab Results  Component Value Date   25OHVITD2 6.2 10/14/2021   25OHVITD3 20 10/14/2021   VD25OH 19.9 (L) 05/17/2022   Last vitamin B12 and Folate Lab Results  Component Value Date   VITAMINB12 220 08/28/2019   FOLATE 12.5 08/28/2019       Objective    BP 128/80 (BP Location: Left Arm, Patient Position: Sitting, Cuff Size: Large)   Pulse 82   Temp 98.3 F (36.8 C) (Oral)   Resp 16   Ht $R'5\' 7"'Pa$  (1.702 m)   Wt 289 lb (131.1 kg)   LMP 05/27/2022 (Approximate)   SpO2 96%   BMI 45.26 kg/m   BP Readings from Last 3 Encounters:  06/17/22 128/80  05/17/22 (!) 172/77  10/14/21 (!) 156/76   Wt Readings from Last 3 Encounters:  06/17/22 289 lb (131.1 kg)  05/17/22 (!) 304 lb (137.9 kg)  10/14/21 (!) 300 lb 14.4 oz (136.5 kg)   SpO2 Readings from  Last 3 Encounters:  06/17/22 96%  05/17/22 98%  10/14/21 98%   Physical Exam Vitals and nursing note reviewed.  Constitutional:      General: She is not in acute distress.    Appearance: Normal appearance. She is obese. She is not ill-appearing, toxic-appearing or diaphoretic.  HENT:     Head: Normocephalic and atraumatic.  Cardiovascular:     Rate and Rhythm: Normal rate and  regular rhythm.     Pulses: Normal pulses.     Heart sounds: Normal heart sounds. No murmur heard.    No friction rub. No gallop.  Pulmonary:     Effort: Pulmonary effort is normal. No respiratory distress.     Breath sounds: Normal breath sounds. No stridor. No wheezing, rhonchi or rales.  Chest:     Chest wall: No tenderness.  Musculoskeletal:        General: No swelling, tenderness, deformity or signs of injury. Normal range of motion.     Right lower leg: No edema.     Left lower leg: No edema.  Skin:    General: Skin is warm and dry.     Capillary Refill: Capillary refill takes less than 2 seconds.     Coloration: Skin is not jaundiced or pale.     Findings: Rash present. No bruising, erythema or lesion.     Comments: Improving; has stopped prednisone, and anti-histamines   Neurological:     General: No focal deficit present.     Mental Status: She is alert and oriented to person, place, and time. Mental status is at baseline.     Cranial Nerves: No cranial nerve deficit.     Sensory: No sensory deficit.     Motor: No weakness.     Coordination: Coordination normal.  Psychiatric:        Mood and Affect: Mood normal.        Behavior: Behavior normal.        Thought Content: Thought content normal.        Judgment: Judgment normal.     No results found for any visits on 06/17/22.  Assessment & Plan     Problem List Items Addressed This Visit       Cardiovascular and Mediastinum   Hypertension associated with diabetes ([place])    Chronic, improved/at goal Goal of <130/<80 Continue Norvasc  5 mg with Hyzaar 100-25 mg        Endocrine   Hypothyroidism due to acquired atrophy of thyroid    Chronic, previously elevated 1 month ago Pt request to repeat TSH and Free T4       Relevant Orders   TSH + free T4     Other   Morbid obesity ([place]) - Primary    Chronic, improved Body mass index is 45.26 kg/m. Discussed importance of healthy weight management Discussed diet and exercise       Need for influenza vaccination    Consented; VIS made available; no immediate side effects following administration; plan to repeat annually        Relevant Orders   Flu Vaccine QUAD 6+ mos PF IM (Fluarix Quad PF) (Completed)   Return in about 3 months (around 09/16/2022) for chonic disease management.     Vonna Kotyk, FNP, have reviewed all documentation for this visit. The documentation on 06/17/22 for the exam, diagnosis, procedures, and orders are all accurate and complete.  Gwyneth Sprout, [place] [phone] (phone) [phone] (fax)  [place]

## 2022-06-17 NOTE — Assessment & Plan Note (Signed)
Consented; VIS made available; no immediate side effects following administration; plan to repeat annually   

## 2022-06-17 NOTE — Assessment & Plan Note (Signed)
Chronic, previously elevated 1 month ago Pt request to repeat TSH and Free T4

## 2022-06-17 NOTE — Assessment & Plan Note (Signed)
Chronic, improved Body mass index is 45.26 kg/m. Discussed importance of healthy weight management Discussed diet and exercise

## 2022-06-18 LAB — TSH+FREE T4
Free T4: 1.67 ng/dL (ref 0.82–1.77)
TSH: 1.23 u[IU]/mL (ref 0.450–4.500)

## 2022-06-19 NOTE — Progress Notes (Signed)
Thyroid is stabilized.  Jacky Kindle, FNP  [facility] [address] [place], [place] [address] [phone] (phone) [phone] (fax) [facility]

## 2022-07-10 ENCOUNTER — Other Ambulatory Visit: Payer: Self-pay | Admitting: Family Medicine

## 2022-07-10 DIAGNOSIS — E1169 Type 2 diabetes mellitus with other specified complication: Secondary | ICD-10-CM

## 2022-07-10 DIAGNOSIS — I152 Hypertension secondary to endocrine disorders: Secondary | ICD-10-CM

## 2022-07-10 DIAGNOSIS — E1165 Type 2 diabetes mellitus with hyperglycemia: Secondary | ICD-10-CM

## 2022-07-10 DIAGNOSIS — E1159 Type 2 diabetes mellitus with other circulatory complications: Secondary | ICD-10-CM

## 2022-07-11 NOTE — Telephone Encounter (Signed)
Requested medication (s) are due for refill today: yes  Requested medication (s) are on the active medication list: yes  Last refill:  06/15/22  Future visit scheduled: yes  Notes to clinic:  medication not assigned to a protocol   Requested Prescriptions  Pending Prescriptions Disp Refills   MOUNJARO 5 MG/0.5ML Pen [Pharmacy Med Name: MOUNJARO 5 MG/0.5 ML PEN]      Sig: INJECT 5 MG SUBCUTANEOUSLY WEEKLY     Off-Protocol Failed - 07/10/2022  1:47 AM      Failed - Medication not assigned to a protocol, review manually.      Passed - Valid encounter within last 12 months    Recent Outpatient Visits           3 weeks ago Morbid obesity [facility])   [facility] Tally Joe T, FNP   1 month ago Type 2 diabetes mellitus with hyperglycemia, without long-term current use of insulin [facility])   [facility] Tally Joe T, FNP   9 months ago Type 2 diabetes mellitus without complication, without long-term current use of insulin [facility])   [facility] Tally Joe T, FNP   1 year ago Type 2 diabetes mellitus without complication, without long-term current use of insulin [facility])   [place], [place], [place]   2 years ago Type 2 diabetes mellitus without complication, without long-term current use of insulin [facility])   [facility] [place], Clearnce Sorrel, [place]       Future Appointments             In 2 months Rollene Rotunda, Jaci Standard, [place], [place]

## 2022-07-13 ENCOUNTER — Other Ambulatory Visit: Payer: Self-pay | Admitting: Family Medicine

## 2022-07-13 DIAGNOSIS — E034 Atrophy of thyroid (acquired): Secondary | ICD-10-CM

## 2022-07-13 NOTE — Telephone Encounter (Signed)
Requested Prescriptions  Pending Prescriptions Disp Refills  . levothyroxine (SYNTHROID) 150 MCG tablet [Pharmacy Med Name: LEVOTHYROXINE 150 MCG TABLET] 90 tablet 0    Sig: TAKE 1 TABLET (150 MCG TOTAL) BY MOUTH DAILY. REPEAT LABS PRIOR TO COMPLETION.     Endocrinology:  Hypothyroid Agents Passed - 07/13/2022  2:41 AM      Passed - TSH in normal range and within 360 days    TSH  Date Value Ref Range Status  06/17/2022 1.230 0.450 - 4.500 uIU/mL Final         Passed - Valid encounter within last 12 months    Recent Outpatient Visits          3 weeks ago Morbid obesity [facility])   [facility] Tally Joe T, FNP   1 month ago Type 2 diabetes mellitus with hyperglycemia, without long-term current use of insulin [facility])   [facility] Tally Joe T, FNP   9 months ago Type 2 diabetes mellitus without complication, without long-term current use of insulin [facility])   [facility] Tally Joe T, FNP   1 year ago Type 2 diabetes mellitus without complication, without long-term current use of insulin [facility])   [place], [place], [place]   2 years ago Type 2 diabetes mellitus without complication, without long-term current use of insulin [facility])   [facility] [place], Clearnce [place], [place]      Future Appointments            In 2 months Rollene Rotunda, Jaci Standard, [place], [place]

## 2022-08-11 ENCOUNTER — Encounter: Payer: Self-pay | Admitting: Family Medicine

## 2022-08-12 ENCOUNTER — Other Ambulatory Visit: Payer: Self-pay | Admitting: Family Medicine

## 2022-08-12 MED ORDER — TIRZEPATIDE 7.5 MG/0.5ML ~~LOC~~ SOAJ
7.5000 mg | SUBCUTANEOUS | 0 refills | Status: DC
Start: 2022-08-12 — End: 2022-10-18

## 2022-09-19 ENCOUNTER — Ambulatory Visit: Payer: BC Managed Care – PPO | Admitting: Family Medicine

## 2022-09-19 ENCOUNTER — Encounter: Payer: Self-pay | Admitting: Family Medicine

## 2022-09-19 VITALS — BP 116/62 | HR 83 | Temp 98.6°F | Wt 269.0 lb

## 2022-09-19 DIAGNOSIS — Z1211 Encounter for screening for malignant neoplasm of colon: Secondary | ICD-10-CM | POA: Diagnosis not present

## 2022-09-19 DIAGNOSIS — E559 Vitamin D deficiency, unspecified: Secondary | ICD-10-CM | POA: Diagnosis not present

## 2022-09-19 DIAGNOSIS — Z23 Encounter for immunization: Secondary | ICD-10-CM | POA: Diagnosis not present

## 2022-09-19 DIAGNOSIS — E66813 Obesity, class 3: Secondary | ICD-10-CM

## 2022-09-19 DIAGNOSIS — E119 Type 2 diabetes mellitus without complications: Secondary | ICD-10-CM | POA: Diagnosis not present

## 2022-09-19 DIAGNOSIS — E034 Atrophy of thyroid (acquired): Secondary | ICD-10-CM | POA: Diagnosis not present

## 2022-09-19 DIAGNOSIS — I152 Hypertension secondary to endocrine disorders: Secondary | ICD-10-CM

## 2022-09-19 DIAGNOSIS — E1159 Type 2 diabetes mellitus with other circulatory complications: Secondary | ICD-10-CM

## 2022-09-19 NOTE — Assessment & Plan Note (Signed)
Chronic, previously low- on supplementation Repeat labs

## 2022-09-19 NOTE — Assessment & Plan Note (Signed)
Chronic, improved Body mass index is 42.13 kg/m. Continue to recommend balanced, lower carb meals. Smaller meal size, adding snacks. Choosing water as drink of choice and increasing purposeful exercise.

## 2022-09-19 NOTE — Assessment & Plan Note (Signed)
Chronic, previously variable Repeat labs Currently on synthroid at 150 mcg Reports weight loss; however, in s/o use of Mounjaro for DM control

## 2022-09-19 NOTE — Assessment & Plan Note (Signed)
Chronic, stable At goal of <130/<80 Continue Norvasc 5 mg, Hyzaar 100-25 mg

## 2022-09-19 NOTE — Progress Notes (Signed)
I,Connie R Striblin,acting as a Education administrator for Gwyneth Sprout, FNP.,have documented all relevant documentation on the behalf of Gwyneth Sprout, FNP,as directed by  Gwyneth Sprout, FNP while in the presence of Gwyneth Sprout, FNP.   Established patient visit  Patient: Kristin Chavez   DOB: 06/03/1971   51 y.o. Female  MRN: [id number] Visit Date: 09/19/2022  Today's healthcare provider: Gwyneth Sprout, FNP  Re Introduced to nurse practitioner role and practice setting.  All questions answered.  Discussed provider/patient relationship and expectations.  Subjective    HPI  Diabetes Mellitus Type II, follow-up  Lab Results  Component Value Date   HGBA1C 7.2 (A) 05/17/2022   HGBA1C 7.0 (A) 10/14/2021   HGBA1C 6.4 (A) 11/12/2020   Last seen for diabetes 3 months ago.  Management since then includes continuing the same treatment. She reports excellent compliance with treatment. She is having side effects.   Home blood sugar records: fasting range: 120-130  Episodes of hypoglycemia? No    Current insulin regiment: none Most Recent Eye Exam: 09/2021  --------------------------------------------------------------------------------------------------- Hypertension, follow-up  BP Readings from Last 3 Encounters:  09/19/22 116/62  06/17/22 128/80  05/17/22 (!) 172/77   Wt Readings from Last 3 Encounters:  09/19/22 269 lb (122 kg)  06/17/22 289 lb (131.1 kg)  05/17/22 (!) 304 lb (137.9 kg)     She was last seen for hypertension 3 months ago.  BP at that visit was 128/80. Management since that visit includes monoriting at home. She reports excellent compliance with treatment. She is having side effects.  She is not exercising. She is not adherent to low salt diet.   Outside blood pressures are raning around 140/80   Use of agents associated with hypertension: none.    --------------------------------------------------------------------------------------------------- Lipid/Cholesterol, follow-up  Last Lipid Panel: Lab Results  Component Value Date   CHOL 179 05/17/2022   LDLCALC 99 05/17/2022   HDL 50 05/17/2022   TRIG 171 (H) 05/17/2022    She was last seen for this 3 months ago.  Management since that visit includes moujaro 7.5 mg.  She reports excellent compliance with treatment. She is not having side effects.   Symptoms: No appetite changes No foot ulcerations  No chest pain No chest pressure/discomfort  No dyspnea No orthopnea  No fatigue No lower extremity edema  No palpitations No paroxysmal nocturnal dyspnea  No nausea No numbness or tingling of extremity  No polydipsia No polyuria  No speech difficulty No syncope   She is following a Regular diet. Current exercise: none  Last metabolic panel Lab Results  Component Value Date   GLUCOSE 161 (H) 05/17/2022   NA 139 05/17/2022   K 4.6 05/17/2022   BUN 13 05/17/2022   CREATININE 0.75 05/17/2022   EGFR 96 05/17/2022   GFRNONAA 84 02/13/2020   CALCIUM 9.8 05/17/2022   AST 12 05/17/2022   ALT 9 05/17/2022   The 10-year ASCVD risk score (Arnett DK, et al., 2019) is: 3.1%  ---------------------------------------------------------------------------------------------------   Medications: Outpatient Medications Prior to Visit  Medication Sig   amLODipine (NORVASC) 5 MG tablet Take 1 tablet (5 mg total) by mouth daily. Take in addition to other BP medication, Hyzaar (Losartan-HCTZ)   blood glucose meter kit and supplies Dispense based on patient and insurance preference. Use up to four times daily as directed. (FOR ICD-10 E10.9, E11.9).   cetirizine (ZYRTEC) 10 MG tablet Take 1 tablet (10 mg total) by mouth at bedtime.  dicyclomine (BENTYL) 20 MG tablet Take 1 tablet (20 mg total) by mouth 3 (three) times daily as needed (for cramps).   famotidine (PEPCID) 20 MG tablet Take  1 tablet (20 mg total) by mouth at bedtime.   glucose blood test strip Use as instructed   levothyroxine (SYNTHROID) 150 MCG tablet TAKE 1 TABLET (150 MCG TOTAL) BY MOUTH DAILY. REPEAT LABS PRIOR TO COMPLETION.   losartan-hydrochlorothiazide (HYZAAR) 100-25 MG tablet Take 1 tablet by mouth daily.   metFORMIN (GLUCOPHAGE) 500 MG tablet TAKE 2 TABLETS BY MOUTH EVERY DAY WITH BREAKFAST   tirzepatide (MOUNJARO) 7.5 MG/0.5ML Pen Inject 7.5 mg into the skin once a week.   triamcinolone ointment (KENALOG) 0.1 % Apply topically 2 (two) times daily.   Vitamin D, Ergocalciferol, (DRISDOL) 1.25 MG (50000 UNIT) CAPS capsule Take 1 capsule (50,000 Units total) by mouth every 7 (seven) days. Repeat labs in 6 months; best taken with meal.   [DISCONTINUED] predniSONE (DELTASONE) 10 MG tablet Day 1 & 2 take 6 tablets Day 3 &4 take 5 tablets Day 5 &6 take 4 tablets Day 7 & 8 take 3 tablets Day 9 & 10 take 2 tablets Day 11 & 12 take 1 tablet Day 13 & 14 take 1/2 tablet   No facility-administered medications prior to visit.    Review of Systems  Last CBC Lab Results  Component Value Date   WBC 8.3 02/13/2020   HGB 12.0 02/13/2020   HCT 36.9 02/13/2020   MCV 89 02/13/2020   MCH 28.8 02/13/2020   RDW 14.4 02/13/2020   PLT 307 51/88/4166   Last metabolic panel Lab Results  Component Value Date   GLUCOSE 161 (H) 05/17/2022   NA 139 05/17/2022   K 4.6 05/17/2022   CL 100 05/17/2022   CO2 22 05/17/2022   BUN 13 05/17/2022   CREATININE 0.75 05/17/2022   EGFR 96 05/17/2022   CALCIUM 9.8 05/17/2022   PROT 7.3 05/17/2022   ALBUMIN 4.2 05/17/2022   LABGLOB 3.1 05/17/2022   AGRATIO 1.4 05/17/2022   BILITOT 0.3 05/17/2022   ALKPHOS 62 05/17/2022   AST 12 05/17/2022   ALT 9 05/17/2022   ANIONGAP 11 08/31/2019   Last hemoglobin A1c Lab Results  Component Value Date   HGBA1C 7.2 (A) 05/17/2022       Objective    BP 116/62 (BP Location: Right Arm, Patient Position: Sitting, Cuff Size: Large)    Pulse 83   Temp 98.6 F (37 C) (Oral)   Wt 269 lb (122 kg)   SpO2 100%   BMI 42.13 kg/m  BP Readings from Last 3 Encounters:  09/19/22 116/62  06/17/22 128/80  05/17/22 (!) 172/77   Wt Readings from Last 3 Encounters:  09/19/22 269 lb (122 kg)  06/17/22 289 lb (131.1 kg)  05/17/22 (!) 304 lb (137.9 kg)   SpO2 Readings from Last 3 Encounters:  09/19/22 100%  06/17/22 96%  05/17/22 98%      Physical Exam Vitals and nursing note reviewed.  Constitutional:      General: She is not in acute distress.    Appearance: Normal appearance. She is obese. She is not ill-appearing, toxic-appearing or diaphoretic.  HENT:     Head: Normocephalic and atraumatic.  Cardiovascular:     Rate and Rhythm: Normal rate and regular rhythm.     Pulses: Normal pulses.     Heart sounds: Normal heart sounds. No murmur heard.    No friction rub. No gallop.  Pulmonary:  Effort: Pulmonary effort is normal. No respiratory distress.     Breath sounds: Normal breath sounds. No stridor. No wheezing, rhonchi or rales.  Chest:     Chest wall: No tenderness.  Abdominal:     General: Bowel sounds are normal.     Palpations: Abdomen is soft.  Musculoskeletal:        General: No swelling, tenderness, deformity or signs of injury. Normal range of motion.     Right lower leg: No edema.     Left lower leg: No edema.  Skin:    General: Skin is warm and dry.     Capillary Refill: Capillary refill takes less than 2 seconds.     Coloration: Skin is not jaundiced or pale.     Findings: No bruising, erythema, lesion or rash.  Neurological:     General: No focal deficit present.     Mental Status: She is alert and oriented to person, place, and time. Mental status is at baseline.     Cranial Nerves: No cranial nerve deficit.     Sensory: No sensory deficit.     Motor: No weakness.     Coordination: Coordination normal.  Psychiatric:        Mood and Affect: Mood normal.        Behavior: Behavior normal.         Thought Content: Thought content normal.        Judgment: Judgment normal.     No results found for any visits on 09/19/22.  Assessment & Plan     Problem List Items Addressed This Visit       Endocrine   Hypothyroidism due to acquired atrophy of thyroid    Chronic, previously variable Repeat labs Currently on synthroid at 150 mcg Reports weight loss; however, in s/o use of Mounjaro for DM control      Relevant Orders   TSH + free T4   Type 2 diabetes mellitus without complication, without long-term current use of insulin (HCC)    Chronic, previously stable Pt unable to void for urine micro Will repeat A1c FBGs are improved per pt report Continue to recommend balanced, lower carb meals. Smaller meal size, adding snacks. Choosing water as drink of choice and increasing purposeful exercise. Continue 1000 mg Metformbin BID and 7.5 Mounjaro weekly       Relevant Orders   Hemoglobin A1c     Other   Avitaminosis D    Chronic, previously low- on supplementation Repeat labs      Relevant Orders   Vitamin D (25 hydroxy)   Screening for colon cancer    Agreeable for referral to GI for traditional colon cancer screening; no complaints at this time       Relevant Orders   Ambulatory referral to Gastroenterology   Other Visit Diagnoses     Need for COVID-19 vaccine    -  Primary   Relevant Orders   PFIZER Comirnaty(GRAY TOP)COVID-19 Vaccine (Completed)      No follow-ups on file.     Vonna Kotyk, FNP, have reviewed all documentation for this visit. The documentation on 09/19/22 for the exam, diagnosis, procedures, and orders are all accurate and complete.  Gwyneth Sprout, [place] [phone] (phone) [phone] (fax)  [place]

## 2022-09-19 NOTE — Assessment & Plan Note (Signed)
Chronic, previously stable Pt unable to void for urine micro Will repeat A1c FBGs are improved per pt report Continue to recommend balanced, lower carb meals. Smaller meal size, adding snacks. Choosing water as drink of choice and increasing purposeful exercise. Continue 1000 mg Metformbin BID and 7.5 Mounjaro weekly

## 2022-09-19 NOTE — Assessment & Plan Note (Signed)
Agreeable for referral to GI for traditional colon cancer screening; no complaints at this time

## 2022-09-20 LAB — VITAMIN D 25 HYDROXY (VIT D DEFICIENCY, FRACTURES): Vit D, 25-Hydroxy: 36.8 ng/mL (ref 30.0–100.0)

## 2022-09-20 LAB — TSH+FREE T4
Free T4: 1.39 ng/dL (ref 0.82–1.77)
TSH: 5.29 u[IU]/mL — ABNORMAL HIGH (ref 0.450–4.500)

## 2022-09-20 LAB — HEMOGLOBIN A1C
Est. average glucose Bld gHb Est-mCnc: 151 mg/dL
Hgb A1c MFr Bld: 6.9 % — ABNORMAL HIGH (ref 4.8–5.6)

## 2022-09-20 NOTE — Progress Notes (Signed)
A1c shows improvement ow at 6.9%. Under 7% shows good glycemic control. Continue to recommend balanced, lower carb meals. Smaller meal size, adding snacks. Choosing water as drink of choice and increasing purposeful exercise.  Vit D is slowly coming up; continue high dose Rx supplements.  As for TSH... we went from 112+50 to 150 mcg and now it is elevated; however, it is lower than it was in August. I would recommend we continue 150 mcg and repeat in 3 months with A1c if you are agreeable.

## 2022-10-18 ENCOUNTER — Other Ambulatory Visit: Payer: Self-pay | Admitting: Family Medicine

## 2022-10-18 ENCOUNTER — Encounter: Payer: Self-pay | Admitting: Family Medicine

## 2022-10-18 MED ORDER — TIRZEPATIDE 10 MG/0.5ML ~~LOC~~ SOAJ: 10.0000 mg | SUBCUTANEOUS | 0 refills | Status: DC

## 2022-10-21 ENCOUNTER — Ambulatory Visit: Payer: BC Managed Care – PPO | Admitting: Podiatry

## 2022-10-21 DIAGNOSIS — L853 Xerosis cutis: Secondary | ICD-10-CM | POA: Diagnosis not present

## 2022-10-21 DIAGNOSIS — B351 Tinea unguium: Secondary | ICD-10-CM | POA: Diagnosis not present

## 2022-10-21 MED ORDER — AMMONIUM LACTATE 12 % EX LOTN: 1.0000 | TOPICAL_LOTION | CUTANEOUS | 0 refills | Status: DC | PRN

## 2022-10-21 NOTE — Progress Notes (Signed)
Subjective:  Patient ID: Kristin Chavez, female    DOB: March 30, 1971,  MRN: [id number]  Chief Complaint  Patient presents with   Nail Problem    Nail fungus    52 y.o. female presents with the above complaint.  Patient presents with thickened elongated dystrophic mycotic toenails x 10.  She states that been present for quite some time she wanted discuss treatment options for it.  She does not want to take oral medication she has liver disease.  She is a diabetic.  She would like to discuss other treatment options for nail fungus.  She has secondary complaint of dry skin as well.  She wanted discuss treatment options with prescription drug.  She is tried over-the-counter medication which has not helped.  n   Review of Systems: Negative except as noted in the HPI. Denies N/V/F/Ch.  Past Medical History:  Diagnosis Date   Diabetes mellitus without complication (HCC)    Hypertension    Hypothyroidism    Liver disease, chronic    Thyroid disease    Thyroid disease     Current Outpatient Medications:    ammonium lactate (AMLACTIN DAILY) 12 % lotion, Apply 1 Application topically as needed for dry skin., Disp: 400 g, Rfl: 0   tirzepatide (MOUNJARO) 10 MG/0.5ML Pen, Inject 10 mg into the skin once a week., Disp: 2 mL, Rfl: 0   amLODipine (NORVASC) 5 MG tablet, Take 1 tablet (5 mg total) by mouth daily. Take in addition to other BP medication, Hyzaar (Losartan-HCTZ), Disp: 90 tablet, Rfl: 3   blood glucose meter kit and supplies, Dispense based on patient and insurance preference. Use up to four times daily as directed. (FOR ICD-10 E10.9, E11.9)., Disp: 1 each, Rfl: 0   cetirizine (ZYRTEC) 10 MG tablet, Take 1 tablet (10 mg total) by mouth at bedtime., Disp: 30 tablet, Rfl: 11   dicyclomine (BENTYL) 20 MG tablet, Take 1 tablet (20 mg total) by mouth 3 (three) times daily as needed (for cramps)., Disp: 90 tablet, Rfl: 5   famotidine (PEPCID) 20 MG tablet, Take 1 tablet (20 mg total) by mouth at  bedtime., Disp: 30 tablet, Rfl: 11   glucose blood test strip, Use as instructed, Disp: 100 each, Rfl: 12   levothyroxine (SYNTHROID) 150 MCG tablet, TAKE 1 TABLET (150 MCG TOTAL) BY MOUTH DAILY. REPEAT LABS PRIOR TO COMPLETION., Disp: 90 tablet, Rfl: 0   losartan-hydrochlorothiazide (HYZAAR) 100-25 MG tablet, Take 1 tablet by mouth daily., Disp: 90 tablet, Rfl: 3   metFORMIN (GLUCOPHAGE) 500 MG tablet, TAKE 2 TABLETS BY MOUTH EVERY DAY WITH BREAKFAST, Disp: 180 tablet, Rfl: 1   triamcinolone ointment (KENALOG) 0.1 %, Apply topically 2 (two) times daily., Disp: 30 g, Rfl: 11   Vitamin D, Ergocalciferol, (DRISDOL) 1.25 MG (50000 UNIT) CAPS capsule, Take 1 capsule (50,000 Units total) by mouth every 7 (seven) days. Repeat labs in 6 months; best taken with meal., Disp: 26 capsule, Rfl: 0  Social History   Tobacco Use  Smoking Status Never  Smokeless Tobacco Never    No Known Allergies Objective:  There were no vitals filed for this visit. There is no height or weight on file to calculate BMI. Constitutional Well developed. Well nourished.  Vascular Dorsalis pedis pulses palpable bilaterally. Posterior tibial pulses palpable bilaterally. Capillary refill normal to all digits.  No cyanosis or clubbing noted. Pedal hair growth normal.  Neurologic Normal speech. Oriented to person, place, and time. Epicritic sensation to light touch grossly present bilaterally.  Dermatologic Nails thickened elongated dystrophic mycotic toenails x 10 nail fungus associated with Skin dry skin with no skin fissure noted.  Moderate xerosis noted  Orthopedic: Normal joint ROM without pain or crepitus bilaterally. No visible deformities. No bony tenderness.   Radiographs: None Assessment:   1. Xerosis of skin   2. Onychomycosis due to dermatophyte   3. Nail fungus    Plan:  Patient was evaluated and treated and all questions answered.  Onychomycosis toenails x 10 -Educated the patient on the etiology  of onychomycosis and various treatment options associated with improving the fungal load.  I explained to the patient that there is 3 treatment options available to treat the onychomycosis including topical, p.o., laser treatment.  Patient elected to undergo laser therapy.  I encouraged her that it would take about 6-7 sessions a month apart.  She states understanding like to proceed with laser  Xerosis -I explained to the patient the etiology of xerosis and various treatment options were extensively discussed.  I explained to the patient the importance of maintaining moisturization of the skin with application of over-the-counter lotion such as Eucerin or Luciderm.  Ammonium lactate was sent to the pharmacy.  I have asked her to apply twice a day.  She states understanding.    No follow-ups on file.

## 2022-10-26 ENCOUNTER — Encounter: Payer: Self-pay | Admitting: *Deleted

## 2022-11-02 ENCOUNTER — Ambulatory Visit (INDEPENDENT_AMBULATORY_CARE_PROVIDER_SITE_OTHER): Payer: BC Managed Care – PPO

## 2022-11-02 DIAGNOSIS — B351 Tinea unguium: Secondary | ICD-10-CM

## 2022-11-02 NOTE — Progress Notes (Signed)
Patient presents today for the 1st laser treatment. Diagnosed with mycotic nail infection by Dr. Posey Pronto.   Toenail most affected 1st right.  All other systems are negative.  Nails were filed thin. Laser therapy was administered to 1-5 toenails bilateral and patient tolerated the treatment well. All safety precautions were in place.   Single laser pass was done on non-affected nails.   Follow up in 4 weeks for laser # 2.

## 2022-11-02 NOTE — Patient Instructions (Signed)

## 2022-11-21 ENCOUNTER — Other Ambulatory Visit: Payer: Self-pay | Admitting: Family Medicine

## 2022-11-22 ENCOUNTER — Other Ambulatory Visit: Payer: Self-pay | Admitting: Family Medicine

## 2022-11-22 ENCOUNTER — Encounter: Payer: Self-pay | Admitting: Family Medicine

## 2022-11-22 DIAGNOSIS — E559 Vitamin D deficiency, unspecified: Secondary | ICD-10-CM

## 2022-11-22 MED ORDER — VITAMIN D (ERGOCALCIFEROL) 1.25 MG (50000 UNIT) PO CAPS: 50000.0000 [IU] | ORAL_CAPSULE | ORAL | 0 refills | Status: DC

## 2022-11-22 MED ORDER — METFORMIN HCL 500 MG PO TABS: ORAL_TABLET | ORAL | 1 refills | Status: AC

## 2022-12-01 ENCOUNTER — Ambulatory Visit (INDEPENDENT_AMBULATORY_CARE_PROVIDER_SITE_OTHER): Payer: BC Managed Care – PPO

## 2022-12-01 DIAGNOSIS — B351 Tinea unguium: Secondary | ICD-10-CM

## 2022-12-01 NOTE — Progress Notes (Signed)
Patient presents today for the 2nd laser treatment. Diagnosed with mycotic nail infection by Dr. Posey Pronto.   Toenail most affected 1st right.  All other systems are negative.  Nails were filed thin. Laser therapy was administered to 1-5 toenails bilateral and patient tolerated the treatment well. All safety precautions were in place.   Single laser pass was done on non-affected nails.   Follow up in 4 weeks for laser # 3.

## 2022-12-29 ENCOUNTER — Ambulatory Visit (INDEPENDENT_AMBULATORY_CARE_PROVIDER_SITE_OTHER): Payer: BC Managed Care – PPO

## 2022-12-29 DIAGNOSIS — B351 Tinea unguium: Secondary | ICD-10-CM

## 2022-12-29 NOTE — Progress Notes (Signed)
Patient presents today for the 3rd laser treatment. Diagnosed with mycotic nail infection by Dr. Posey Pronto.    Toenail most affected 1st right.   All other systems are negative.   Nails were filed thin. Laser therapy was administered to 1-5 toenails bilateral and patient tolerated the treatment well. All safety precautions were in place.    Single laser pass was done on non-affected nails.     Follow up in 6 weeks for laser # 4

## 2023-01-01 ENCOUNTER — Other Ambulatory Visit: Payer: Self-pay | Admitting: Family Medicine

## 2023-01-01 DIAGNOSIS — E034 Atrophy of thyroid (acquired): Secondary | ICD-10-CM

## 2023-01-02 NOTE — Telephone Encounter (Signed)
Requested Prescriptions  Pending Prescriptions Disp Refills   losartan-hydrochlorothiazide (HYZAAR) 100-25 MG tablet [Pharmacy Med Name: LOSARTAN-HCTZ 100-25 MG TAB] 90 tablet 3    Sig: TAKE 1 TABLET BY MOUTH EVERY DAY     Cardiovascular: ARB + Diuretic Combos Failed - 01/01/2023  1:10 AM      Failed - K in normal range and within 180 days    Potassium  Date Value Ref Range Status  05/17/2022 4.6 3.5 - 5.2 mmol/L Final         Failed - Na in normal range and within 180 days    Sodium  Date Value Ref Range Status  05/17/2022 139 134 - 144 mmol/L Final         Failed - Cr in normal range and within 180 days    Creatinine, Ser  Date Value Ref Range Status  05/17/2022 0.75 0.57 - 1.00 mg/dL Final         Failed - eGFR is 10 or above and within 180 days    GFR calc Af Amer  Date Value Ref Range Status  02/13/2020 97 >59 mL/min/1.73 Final    Comment:    **[facility] currently reports eGFR in compliance with the current**   recommendations of the [employer]. [facility] will   update reporting as new guidelines are published from the NKF-ASN   Task force.    GFR calc non Af Amer  Date Value Ref Range Status  02/13/2020 84 >59 mL/min/1.73 Final   eGFR  Date Value Ref Range Status  05/17/2022 96 >59 mL/min/1.73 Final         Passed - Patient is not pregnant      Passed - Last BP in normal range    BP Readings from Last 1 Encounters:  09/19/22 116/62         Passed - Valid encounter within last 6 months    Recent Outpatient Visits           3 months ago Need for COVID-19 vaccine   [facility] Tally Joe T, FNP   6 months ago Morbid obesity [facility])   [place] Tally Joe T, FNP   7 months ago Type 2 diabetes mellitus with hyperglycemia, without long-term current use of insulin [facility])   [place] Tally Joe T, FNP   1 year ago Type 2 diabetes mellitus without complication,  without long-term current use of insulin [facility])   [place] Tally Joe T, FNP   2 years ago Type 2 diabetes mellitus without complication, without long-term current use of insulin [facility])   [place], Clearnce Sorrel, [place]       Future Appointments             In 2 months Gwyneth Sprout, [place], [facility]             levothyroxine (SYNTHROID) 150 MCG tablet [Pharmacy Med Name: LEVOTHYROXINE 150 MCG TABLET] 90 tablet 0    Sig: TAKE 1 TABLET (150 MCG TOTAL) BY MOUTH DAILY. REPEAT LABS PRIOR TO COMPLETION.     Endocrinology:  Hypothyroid Agents Failed - 01/01/2023  1:10 AM      Failed - TSH in normal range and within 360 days    TSH  Date Value Ref Range Status  09/19/2022 5.290 (H) 0.450 - 4.500 uIU/mL Final  Passed - Valid encounter within last 12 months    Recent Outpatient Visits           3 months ago Need for COVID-19 vaccine   [facility] Tally Joe T, FNP   6 months ago Morbid obesity [facility])   [place] Tally Joe T, FNP   7 months ago Type 2 diabetes mellitus with hyperglycemia, without long-term current use of insulin [facility])   [place] Tally Joe T, FNP   1 year ago Type 2 diabetes mellitus without complication, without long-term current use of insulin [facility])   [place] Tally Joe T, FNP   2 years ago Type 2 diabetes mellitus without complication, without long-term current use of insulin [facility])   [place], Clearnce Sorrel, [place]       Future Appointments             In 2 months Rollene Rotunda, Jaci Standard, [place], [facility]

## 2023-02-10 ENCOUNTER — Other Ambulatory Visit: Payer: Self-pay | Admitting: Family Medicine

## 2023-02-10 ENCOUNTER — Ambulatory Visit (INDEPENDENT_AMBULATORY_CARE_PROVIDER_SITE_OTHER): Payer: BC Managed Care – PPO

## 2023-02-10 ENCOUNTER — Encounter: Payer: Self-pay | Admitting: Family Medicine

## 2023-02-10 DIAGNOSIS — B351 Tinea unguium: Secondary | ICD-10-CM

## 2023-02-10 NOTE — Progress Notes (Signed)
Patient presents today for the 4th laser treatment. Diagnosed with mycotic nail infection by Dr. Allena Katz.    Toenail most affected 1st right.   All other systems are negative.   Nails were filed thin. Laser therapy was administered to 1-5 toenails bilateral and patient tolerated the treatment well. All safety precautions were in place.    Single laser pass was done on non-affected nails.     Follow up in 6 weeks for laser # 5

## 2023-03-17 ENCOUNTER — Other Ambulatory Visit (HOSPITAL_COMMUNITY)
Admission: RE | Admit: 2023-03-17 | Discharge: 2023-03-17 | Disposition: A | Discharge status: Home / Self Care | Payer: BC Managed Care – PPO | Source: Ambulatory Visit | Attending: Family Medicine | Admitting: Family Medicine

## 2023-03-17 ENCOUNTER — Encounter: Payer: Self-pay | Admitting: Family Medicine

## 2023-03-17 ENCOUNTER — Ambulatory Visit (INDEPENDENT_AMBULATORY_CARE_PROVIDER_SITE_OTHER): Payer: BC Managed Care – PPO | Admitting: Family Medicine

## 2023-03-17 VITALS — BP 130/69 | HR 86 | Ht 65.0 in | Wt 239.0 lb

## 2023-03-17 DIAGNOSIS — E559 Vitamin D deficiency, unspecified: Secondary | ICD-10-CM

## 2023-03-17 DIAGNOSIS — Z124 Encounter for screening for malignant neoplasm of cervix: Secondary | ICD-10-CM

## 2023-03-17 DIAGNOSIS — K760 Fatty (change of) liver, not elsewhere classified: Secondary | ICD-10-CM

## 2023-03-17 DIAGNOSIS — I152 Hypertension secondary to endocrine disorders: Secondary | ICD-10-CM

## 2023-03-17 DIAGNOSIS — Z1211 Encounter for screening for malignant neoplasm of colon: Secondary | ICD-10-CM

## 2023-03-17 DIAGNOSIS — E1159 Type 2 diabetes mellitus with other circulatory complications: Secondary | ICD-10-CM | POA: Diagnosis not present

## 2023-03-17 DIAGNOSIS — E1165 Type 2 diabetes mellitus with hyperglycemia: Secondary | ICD-10-CM

## 2023-03-17 DIAGNOSIS — E785 Hyperlipidemia, unspecified: Secondary | ICD-10-CM | POA: Diagnosis not present

## 2023-03-17 DIAGNOSIS — B351 Tinea unguium: Secondary | ICD-10-CM

## 2023-03-17 DIAGNOSIS — Z1231 Encounter for screening mammogram for malignant neoplasm of breast: Secondary | ICD-10-CM

## 2023-03-17 DIAGNOSIS — E034 Atrophy of thyroid (acquired): Secondary | ICD-10-CM | POA: Diagnosis not present

## 2023-03-17 DIAGNOSIS — E1169 Type 2 diabetes mellitus with other specified complication: Secondary | ICD-10-CM | POA: Diagnosis not present

## 2023-03-17 DIAGNOSIS — Z0000 Encounter for general adult medical examination without abnormal findings: Secondary | ICD-10-CM | POA: Diagnosis not present

## 2023-03-18 LAB — CBC WITH DIFFERENTIAL/PLATELET
Basophils Absolute: 0.1 10*3/uL (ref 0.0–0.2)
Basos: 2 %
EOS (ABSOLUTE): 0.3 10*3/uL (ref 0.0–0.4)
Eos: 3 %
Hematocrit: 28.6 % — ABNORMAL LOW (ref 34.0–46.6)
Hemoglobin: 7.9 g/dL — ABNORMAL LOW (ref 11.1–15.9)
Immature Grans (Abs): 0 10*3/uL (ref 0.0–0.1)
Immature Granulocytes: 0 %
Lymphocytes Absolute: 2.7 10*3/uL (ref 0.7–3.1)
Lymphs: 33 %
MCH: 18.1 pg — ABNORMAL LOW (ref 26.6–33.0)
MCHC: 27.6 g/dL — ABNORMAL LOW (ref 31.5–35.7)
MCV: 65 fL — ABNORMAL LOW (ref 79–97)
Monocytes Absolute: 0.6 10*3/uL (ref 0.1–0.9)
Monocytes: 8 %
Neutrophils Absolute: 4.4 10*3/uL (ref 1.4–7.0)
Neutrophils: 54 %
Platelets: 501 10*3/uL — ABNORMAL HIGH (ref 150–450)
RBC: 4.37 x10E6/uL (ref 3.77–5.28)
RDW: 18.7 % — ABNORMAL HIGH (ref 11.7–15.4)
WBC: 8.2 10*3/uL (ref 3.4–10.8)

## 2023-03-18 LAB — COMPREHENSIVE METABOLIC PANEL
ALT: 12 IU/L (ref 0–32)
AST: 13 IU/L (ref 0–40)
Albumin/Globulin Ratio: 1.6 (ref 1.2–2.2)
Albumin: 4.6 g/dL (ref 3.8–4.9)
Alkaline Phosphatase: 61 IU/L (ref 44–121)
BUN/Creatinine Ratio: 21 (ref 9–23)
BUN: 17 mg/dL (ref 6–24)
Bilirubin Total: 0.4 mg/dL (ref 0.0–1.2)
CO2: 25 mmol/L (ref 20–29)
Calcium: 9.8 mg/dL (ref 8.7–10.2)
Chloride: 100 mmol/L (ref 96–106)
Creatinine, Ser: 0.81 mg/dL (ref 0.57–1.00)
Globulin, Total: 2.9 g/dL (ref 1.5–4.5)
Glucose: 110 mg/dL — ABNORMAL HIGH (ref 70–99)
Potassium: 4 mmol/L (ref 3.5–5.2)
Sodium: 138 mmol/L (ref 134–144)
Total Protein: 7.5 g/dL (ref 6.0–8.5)
eGFR: 87 mL/min/{1.73_m2} (ref >59)

## 2023-03-18 LAB — LIPID PANEL
Chol/HDL Ratio: 3.5 ratio (ref 0.0–4.4)
Cholesterol, Total: 166 mg/dL (ref 100–199)
HDL: 48 mg/dL (ref >39)
LDL Chol Calc (NIH): 96 mg/dL (ref 0–99)
Triglycerides: 125 mg/dL (ref 0–149)
VLDL Cholesterol Cal: 22 mg/dL (ref 5–40)

## 2023-03-18 LAB — GAMMA GT: GGT: 13 IU/L (ref 0–60)

## 2023-03-18 LAB — VITAMIN D 25 HYDROXY (VIT D DEFICIENCY, FRACTURES): Vit D, 25-Hydroxy: 48.9 ng/mL (ref 30.0–100.0)

## 2023-03-18 LAB — MICROALBUMIN / CREATININE URINE RATIO
Creatinine, Urine: 63.4 mg/dL
Microalb/Creat Ratio: 7 mg/g creat (ref 0–29)
Microalbumin, Urine: 4.2 ug/mL

## 2023-03-18 LAB — HEMOGLOBIN A1C
Est. average glucose Bld gHb Est-mCnc: 131 mg/dL
Hgb A1c MFr Bld: 6.2 % — ABNORMAL HIGH (ref 4.8–5.6)

## 2023-03-18 LAB — TSH: TSH: 2.84 u[IU]/mL (ref 0.450–4.500)

## 2023-03-19 DIAGNOSIS — B351 Tinea unguium: Secondary | ICD-10-CM | POA: Insufficient documentation

## 2023-03-19 DIAGNOSIS — Z124 Encounter for screening for malignant neoplasm of cervix: Secondary | ICD-10-CM | POA: Insufficient documentation

## 2023-03-19 MED ORDER — TERBINAFINE HCL 250 MG PO TABS: 250.0000 mg | ORAL_TABLET | Freq: Every day | ORAL | 0 refills | Status: DC

## 2023-03-19 NOTE — Assessment & Plan Note (Signed)
PAP completed; pt tolerated well

## 2023-03-19 NOTE — Assessment & Plan Note (Signed)
Chronic, borderline Goal 129/79 or less Remains on 5 mg norvasc as well as hyzaar 100-25

## 2023-03-19 NOTE — Patient Instructions (Addendum)
Please call and schedule your DM eye exam Please schedule colonoscopy Please schedule mammogram Please call and schedule your mammogram:  [facility]  [address], [address] [facility] [place],  [place]  [address] Get Driving Directions Main: [phone]  Sunday:Closed Monday:7:20 AM - 5:00 PM Tuesday:7:20 AM - 5:00 PM Wednesday:7:20 AM - 5:00 PM Thursday:7:20 AM - 5:00 PM Friday:7:20 AM - 4:30 PM Saturday:Closed

## 2023-03-19 NOTE — Assessment & Plan Note (Signed)
Chronic, previously stable Pt concern given no oral antifungal option for toe nail fungus Will repeat CMP and GGT at this time

## 2023-03-19 NOTE — Assessment & Plan Note (Signed)
Due for screening for mammogram, denies breast concerns, provided with phone number to call and schedule appointment for mammogram. Encouraged to repeat breast cancer screening every 1-2 years.  

## 2023-03-19 NOTE — Assessment & Plan Note (Signed)
Chronic, previously stable A1c goal <7% Continue to recommend balanced, lower carb meals. Smaller meal size, adding snacks. Choosing water as drink of choice and increasing purposeful exercise. Remains on mounjaro 10 mg, metfomrin 1000 mg qam as well as BP medication; defers statin Encouraged to schedule DM eye exam

## 2023-03-19 NOTE — Assessment & Plan Note (Signed)
Referral placed to GI; pt wishes to defer scheduling at this time d/t cost concerns

## 2023-03-19 NOTE — Progress Notes (Signed)
Recommend repeat CBC; if remains low/anemia- recommend GI and Hematology referrals. A1c now to 6.2; congrats! You're doing great. Continue to recommend balanced, lower carb meals. Smaller meal size, adding snacks. Choosing water as drink of choice and increasing purposeful exercise. Thyroid is stabilized; LDL remains elevated at 96. Goal of 55-70;however, the 10-year ASCVD risk score (Arnett DK, et al., 2019) is: 3.7% Vit D is good; GGT [additional liver test] normal. Normal urine micro. PAP pending.

## 2023-03-19 NOTE — Assessment & Plan Note (Signed)
Improved Body mass index is 39.77 kg/m. Discussed importance of healthy weight management Discussed diet and exercise Associated with HTN, HLD, DM

## 2023-03-19 NOTE — Progress Notes (Signed)
Complete physical exam   Patient: Kristin Chavez   DOB: March 12, 1971   52 y.o. Female  MRN: [id number] Visit Date: 03/17/2023  Today's healthcare provider: Jacky Kindle, FNP  Re Introduced to nurse practitioner role and practice setting.  All questions answered.  Discussed provider/patient relationship and expectations.  Chief Complaint  Patient presents with   Annual Exam   Subjective    Kristin Chavez is a 52 y.o. female who presents today for a complete physical exam.  She reports consuming a general diet. The patient has a physically strenuous job, but has no regular exercise apart from work.  She generally feels well. She reports sleeping well. She does have additional problems to discuss today.  HPI     Past Medical History:  Diagnosis Date   Diabetes mellitus without complication (HCC)    Hypertension    Hypothyroidism    Liver disease, chronic    Thyroid disease    Thyroid disease    Past Surgical History:  Procedure Laterality Date   CESAREAN SECTION     COLONOSCOPY WITH PROPOFOL N/A 04/01/2016   Procedure: COLONOSCOPY WITH PROPOFOL;  Surgeon: Christena Deem, MD;  Location: [facility] ENDOSCOPY;  Service: Endoscopy;  Laterality: N/A;   Hx of laparoscopy  1998   Social History   Socioeconomic History   Marital status: Married    Spouse name: Not on file   Number of children: Not on file   Years of education: Not on file   Highest education level: Not on file  Occupational History   Not on file  Tobacco Use   Smoking status: Never   Smokeless tobacco: Never  Substance and Sexual Activity   Alcohol use: No   Drug use: No   Sexual activity: Yes    Birth control/protection: Other-see comments  Other Topics Concern   Not on file  Social History Narrative   Not on file   Social Determinants of Health   Financial Resource Strain: Not on file  Food Insecurity: Not on file  Transportation Needs: Not on file  Physical Activity: Not on file  Stress: Not  on file  Social Connections: Not on file  Intimate Partner Violence: Not on file   Family Status  Relation Name Status   Mother  Alive   Father  Alive   Sister  Alive   Emelda Brothers  (Not Specified)   MGM  Deceased at age 69       Congestive heart failure   MGF  Alive   PGM  Deceased at age 7       Ovarian cancer   PGF  Deceased       Died from a stroke   Brother  Alive   Family History  Problem Relation Age of Onset   Hypertension Mother    Breast cancer Paternal Aunt    Cancer Paternal Grandmother    No Known Allergies  Patient Care Team: Jacky Kindle, FNP as PCP - General (Family Medicine)   Medications: Outpatient Medications Prior to Visit  Medication Sig   amLODipine (NORVASC) 5 MG tablet Take 1 tablet (5 mg total) by mouth daily. Take in addition to other BP medication, Hyzaar (Losartan-HCTZ)   ammonium lactate (AMLACTIN DAILY) 12 % lotion Apply 1 Application topically as needed for dry skin.   blood glucose meter kit and supplies Dispense based on patient and insurance preference. Use up to four times daily as directed. (FOR ICD-10 E10.9, E11.9).  dicyclomine (BENTYL) 20 MG tablet Take 1 tablet (20 mg total) by mouth 3 (three) times daily as needed (for cramps).   glucose blood test strip Use as instructed   levothyroxine (SYNTHROID) 150 MCG tablet TAKE 1 TABLET (150 MCG TOTAL) BY MOUTH DAILY. REPEAT LABS PRIOR TO COMPLETION.   losartan-hydrochlorothiazide (HYZAAR) 100-25 MG tablet TAKE 1 TABLET BY MOUTH EVERY DAY   metFORMIN (GLUCOPHAGE) 500 MG tablet TAKE 2 TABLETS BY MOUTH EVERY DAY WITH BREAKFAST   Omega-3 Fatty Acids (FISH OIL OMEGA-3 PO) Take by mouth.   Probiotic Product (PROBIOTIC DAILY PO) Take by mouth.   tirzepatide (MOUNJARO) 10 MG/0.5ML Pen INJECT 10 MG INTO THE SKIN ONCE WEEKLY   Vitamin D, Ergocalciferol, (DRISDOL) 1.25 MG (50000 UNIT) CAPS capsule Take 1 capsule (50,000 Units total) by mouth every 7 (seven) days. Repeat labs in 6 months; best taken  with meal.   No facility-administered medications prior to visit.    Review of Systems  Last CBC Lab Results  Component Value Date   WBC 8.2 03/17/2023   HGB 7.9 (L) 03/17/2023   HCT 28.6 (L) 03/17/2023   MCV 65 (L) 03/17/2023   MCH 18.1 (L) 03/17/2023   RDW 18.7 (H) 03/17/2023   PLT 501 (H) 03/17/2023   Last metabolic panel Lab Results  Component Value Date   GLUCOSE 110 (H) 03/17/2023   NA 138 03/17/2023   K 4.0 03/17/2023   CL 100 03/17/2023   CO2 25 03/17/2023   BUN 17 03/17/2023   CREATININE 0.81 03/17/2023   EGFR 87 03/17/2023   CALCIUM 9.8 03/17/2023   PROT 7.5 03/17/2023   ALBUMIN 4.6 03/17/2023   LABGLOB 2.9 03/17/2023   AGRATIO 1.6 03/17/2023   BILITOT 0.4 03/17/2023   ALKPHOS 61 03/17/2023   AST 13 03/17/2023   ALT 12 03/17/2023   ANIONGAP 11 08/31/2019   Last lipids Lab Results  Component Value Date   CHOL 166 03/17/2023   HDL 48 03/17/2023   LDLCALC 96 03/17/2023   TRIG 125 03/17/2023   CHOLHDL 3.5 03/17/2023   Last hemoglobin A1c Lab Results  Component Value Date   HGBA1C 6.2 (H) 03/17/2023   Last thyroid functions Lab Results  Component Value Date   TSH 2.840 03/17/2023   T4TOTAL 7.1 11/12/2020   Last vitamin D Lab Results  Component Value Date   25OHVITD2 6.2 10/14/2021   25OHVITD3 20 10/14/2021   VD25OH 48.9 03/17/2023    Objective    BP 130/69 (BP Location: Left Arm, Patient Position: Sitting, Cuff Size: Large)   Pulse 86   Ht 5\' 5"  (1.651 m)   Wt 239 lb (108.4 kg)   SpO2 98%   BMI 39.77 kg/m   BP Readings from Last 3 Encounters:  03/17/23 130/69  09/19/22 116/62  06/17/22 128/80   Wt Readings from Last 3 Encounters:  03/17/23 239 lb (108.4 kg)  09/19/22 269 lb (122 kg)  06/17/22 289 lb (131.1 kg)   SpO2 Readings from Last 3 Encounters:  03/17/23 98%  09/19/22 100%  06/17/22 96%   Physical Exam Vitals and nursing note reviewed.  Constitutional:      General: She is awake. She is not in acute distress.     Appearance: Normal appearance. She is well-developed and well-groomed. She is obese. She is not ill-appearing, toxic-appearing or diaphoretic.  HENT:     Head: Normocephalic and atraumatic.     Jaw: There is normal jaw occlusion. No trismus, tenderness, swelling or pain on movement.  Right Ear: Hearing, tympanic membrane, ear canal and external ear normal. There is no impacted cerumen.     Left Ear: Hearing, tympanic membrane, ear canal and external ear normal. There is no impacted cerumen.     Nose: Nose normal. No congestion or rhinorrhea.     Right Turbinates: Not enlarged, swollen or pale.     Left Turbinates: Not enlarged, swollen or pale.     Right Sinus: No maxillary sinus tenderness or frontal sinus tenderness.     Left Sinus: No maxillary sinus tenderness or frontal sinus tenderness.     Mouth/Throat:     Lips: Pink.     Mouth: Mucous membranes are moist. No injury.     Tongue: No lesions.     Pharynx: Oropharynx is clear. Uvula midline. No pharyngeal swelling, oropharyngeal exudate, posterior oropharyngeal erythema or uvula swelling.     Tonsils: No tonsillar exudate or tonsillar abscesses.  Eyes:     General: Lids are normal. Lids are everted, no foreign bodies appreciated. Vision grossly intact. Gaze aligned appropriately. No allergic shiner or visual field deficit.       Right eye: No discharge.        Left eye: No discharge.     Extraocular Movements: Extraocular movements intact.     Conjunctiva/sclera: Conjunctivae normal.     Right eye: Right conjunctiva is not injected. No exudate.    Left eye: Left conjunctiva is not injected. No exudate.    Pupils: Pupils are equal, round, and reactive to light.  Neck:     Thyroid: No thyroid mass, thyromegaly or thyroid tenderness.     Vascular: No carotid bruit.     Trachea: Trachea normal.  Cardiovascular:     Rate and Rhythm: Normal rate and regular rhythm.     Pulses: Normal pulses.          Carotid pulses are 2+ on the  right side and 2+ on the left side.      Radial pulses are 2+ on the right side and 2+ on the left side.       Dorsalis pedis pulses are 2+ on the right side and 2+ on the left side.       Posterior tibial pulses are 2+ on the right side and 2+ on the left side.     Heart sounds: Normal heart sounds, S1 normal and S2 normal. No murmur heard.    No friction rub. No gallop.  Pulmonary:     Effort: Pulmonary effort is normal. No respiratory distress.     Breath sounds: Normal breath sounds and air entry. No stridor. No wheezing, rhonchi or rales.  Chest:     Chest wall: No tenderness.  Breasts:    Tanner Score is 5.     Comments: Breasts: breasts appear normal, no suspicious masses, no skin or nipple changes or axillary nodes, right breast normal without mass, skin or nipple changes or axillary nodes, left breast normal without mass, skin or nipple changes or axillary nodes, unchanged from previous exams, risk and benefit of breast self-exam was discussed. Pendulous breasts iso weight loss.  Abdominal:     General: Abdomen is flat. Bowel sounds are normal. There is no distension.     Palpations: Abdomen is soft. There is no mass.     Tenderness: There is no abdominal tenderness. There is no right CVA tenderness, left CVA tenderness, guarding or rebound.     Hernia: No hernia is present.  Genitourinary:  General: Normal vulva.     Exam position: Lithotomy position.     Tanner stage (genital): 5.     Vagina: Normal.     Cervix: Normal.     Uterus: Normal.      Adnexa: Right adnexa normal and left adnexa normal.     Comments: Normal looking cervix; PAP completed without difficulty. Internal exam normal. Musculoskeletal:        General: No swelling, tenderness, deformity or signs of injury. Normal range of motion.     Cervical back: Full passive range of motion without pain, normal range of motion and neck supple. No edema, rigidity or tenderness. No muscular tenderness.     Right lower  leg: No edema.     Left lower leg: No edema.  Lymphadenopathy:     Cervical: No cervical adenopathy.     Right cervical: No superficial, deep or posterior cervical adenopathy.    Left cervical: No superficial, deep or posterior cervical adenopathy.  Skin:    General: Skin is warm and dry.     Capillary Refill: Capillary refill takes less than 2 seconds.     Coloration: Skin is not jaundiced or pale.     Findings: No bruising, erythema, lesion or rash.  Neurological:     General: No focal deficit present.     Mental Status: She is alert and oriented to person, place, and time. Mental status is at baseline.     GCS: GCS eye subscore is 4. GCS verbal subscore is 5. GCS motor subscore is 6.     Sensory: Sensation is intact. No sensory deficit.     Motor: Motor function is intact. No weakness.     Coordination: Coordination is intact. Coordination normal.     Gait: Gait is intact. Gait normal.  Psychiatric:        Attention and Perception: Attention and perception normal.        Mood and Affect: Mood and affect normal.        Speech: Speech normal.        Behavior: Behavior normal. Behavior is cooperative.        Thought Content: Thought content normal.        Cognition and Memory: Cognition and memory normal.        Judgment: Judgment normal.      Last depression screening scores    03/17/2023    2:51 PM 09/19/2022    2:41 PM 06/17/2022    2:44 PM  PHQ 2/9 Scores  PHQ - 2 Score 0 0 0  PHQ- 9 Score 1 0 1   Last fall risk screening    03/17/2023    2:51 PM  Fall Risk   Falls in the past year? 0  Number falls in past yr: 0  Injury with Fall? 0   Last Audit-C alcohol use screening    09/19/2022    2:41 PM  Alcohol Use Disorder Test (AUDIT)  1. How often do you have a drink containing alcohol? 0  2. How many drinks containing alcohol do you have on a typical day when you are drinking? 0  3. How often do you have six or more drinks on one occasion? 0  AUDIT-C Score 0   A  score of 3 or more in women, and 4 or more in men indicates increased risk for alcohol abuse, EXCEPT if all of the points are from question 1   Results for orders placed or performed in visit on  03/17/23  Comprehensive Metabolic Panel (CMET)  Result Value Ref Range   Glucose 110 (H) 70 - 99 mg/dL   BUN 17 6 - 24 mg/dL   Creatinine, Ser 8.29 0.57 - 1.00 mg/dL   eGFR 87 >56 OZ/HYQ/6.57   BUN/Creatinine Ratio 21 9 - 23   Sodium 138 134 - 144 mmol/L   Potassium 4.0 3.5 - 5.2 mmol/L   Chloride 100 96 - 106 mmol/L   CO2 25 20 - 29 mmol/L   Calcium 9.8 8.7 - 10.2 mg/dL   Total Protein 7.5 6.0 - 8.5 g/dL   Albumin 4.6 3.8 - 4.9 g/dL   Globulin, Total 2.9 1.5 - 4.5 g/dL   Albumin/Globulin Ratio 1.6 1.2 - 2.2   Bilirubin Total 0.4 0.0 - 1.2 mg/dL   Alkaline Phosphatase 61 44 - 121 IU/L   AST 13 0 - 40 IU/L   ALT 12 0 - 32 IU/L  CBC with Differential/Platelet  Result Value Ref Range   WBC 8.2 3.4 - 10.8 x10E3/uL   RBC 4.37 3.77 - 5.28 x10E6/uL   Hemoglobin 7.9 (L) 11.1 - 15.9 g/dL   Hematocrit 84.6 (L) 96.2 - 46.6 %   MCV 65 (L) 79 - 97 fL   MCH 18.1 (L) 26.6 - 33.0 pg   MCHC 27.6 (L) 31.5 - 35.7 g/dL   RDW 95.2 (H) 84.1 - 32.4 %   Platelets 501 (H) 150 - 450 x10E3/uL   Neutrophils 54 Not Estab. %   Lymphs 33 Not Estab. %   Monocytes 8 Not Estab. %   Eos 3 Not Estab. %   Basos 2 Not Estab. %   Neutrophils Absolute 4.4 1.4 - 7.0 x10E3/uL   Lymphocytes Absolute 2.7 0.7 - 3.1 x10E3/uL   Monocytes Absolute 0.6 0.1 - 0.9 x10E3/uL   EOS (ABSOLUTE) 0.3 0.0 - 0.4 x10E3/uL   Basophils Absolute 0.1 0.0 - 0.2 x10E3/uL   Immature Granulocytes 0 Not Estab. %   Immature Grans (Abs) 0.0 0.0 - 0.1 x10E3/uL  TSH  Result Value Ref Range   TSH 2.840 0.450 - 4.500 uIU/mL  Hemoglobin A1c  Result Value Ref Range   Hgb A1c MFr Bld 6.2 (H) 4.8 - 5.6 %   Est. average glucose Bld gHb Est-mCnc 131 mg/dL  Lipid panel  Result Value Ref Range   Cholesterol, Total 166 100 - 199 mg/dL   Triglycerides  401 0 - 149 mg/dL   HDL 48 >02 mg/dL   VLDL Cholesterol Cal 22 5 - 40 mg/dL   LDL Chol Calc (NIH) 96 0 - 99 mg/dL   Chol/HDL Ratio 3.5 0.0 - 4.4 ratio  Vitamin D (25 hydroxy)  Result Value Ref Range   Vit D, 25-Hydroxy 48.9 30.0 - 100.0 ng/mL  Gamma GT  Result Value Ref Range   GGT 13 0 - 60 IU/L  Urine Microalbumin w/creat. ratio  Result Value Ref Range   Creatinine, Urine 63.4 Not Estab. mg/dL   Microalbumin, Urine 4.2 Not Estab. ug/mL   Microalb/Creat Ratio 7 0 - 29 mg/g creat    Assessment & Plan    Routine Health Maintenance and Physical Exam  Exercise Activities and Dietary recommendations  Goals   None     Immunization History  Administered Date(s) Administered   Influenza,inj,Quad PF,6+ Mos 06/19/2013, 08/28/2019, 06/17/2022   Moderna Sars-Covid-2 Vaccination 10/12/2020   PFIZER Comirnaty(Gray Top)Covid-19 Tri-Sucrose Vaccine 09/19/2022   PFIZER(Purple Top)SARS-COV-2 Vaccination 08/23/2021   Pneumococcal Polysaccharide-23 08/28/2019    Health Maintenance  Topic  Date Due   OPHTHALMOLOGY EXAM  Never done   DTaP/Tdap/Td (1 - Tdap) Never done   PAP SMEAR-Modifier  Never done   Zoster Vaccines- Shingrix (1 of 2) Never done   Colonoscopy  04/01/2021   COVID-19 Vaccine (4 - 2023-24 season) 11/14/2022   INFLUENZA VACCINE  05/11/2023   HEMOGLOBIN A1C  09/16/2023   Diabetic kidney evaluation - eGFR measurement  03/16/2024   Diabetic kidney evaluation - Urine ACR  03/16/2024   FOOT EXAM  03/16/2024   MAMMOGRAM  06/10/2024   Hepatitis C Screening  Completed   HIV Screening  Completed   HPV VACCINES  Aged Out    Discussed health benefits of physical activity, and encouraged her to engage in regular exercise appropriate for her age and condition.  Problem List Items Addressed This Visit       Cardiovascular and Mediastinum   Hypertension associated with diabetes (HCC)    Chronic, borderline Goal 129/79 or less Remains on 5 mg norvasc as well as hyzaar  100-25      Relevant Orders   Comprehensive Metabolic Panel (CMET) (Completed)   CBC with Differential/Platelet (Completed)   Urine Microalbumin w/creat. ratio (Completed)     Digestive   Metabolic dysfunction-associated steatotic liver disease (MASLD)    Chronic, previously stable Pt concern given no oral antifungal option for toe nail fungus Will repeat CMP and GGT at this time      Relevant Orders   Lipid panel (Completed)   Gamma GT (Completed)     Endocrine   Hyperlipidemia associated with type 2 diabetes mellitus (HCC)    Chronic, previously elevated recommend diet low in saturated fat and regular exercise - 30 min at least 5 times per week Repeat LP Goal LDL 55-70 Remains on omega 3s daily Consider statin start The 10-year ASCVD risk score (Arnett DK, et al., 2019) is: 3.7%       Relevant Orders   Lipid panel (Completed)   Urine Microalbumin w/creat. ratio (Completed)   Hypothyroidism due to acquired atrophy of thyroid    Chronic, denies s/s Remains on 150 mcg Repeat labs as previously off      Relevant Orders   TSH (Completed)   Type 2 diabetes mellitus with hyperglycemia, without long-term current use of insulin (HCC)    Chronic, previously stable A1c goal <7% Continue to recommend balanced, lower carb meals. Smaller meal size, adding snacks. Choosing water as drink of choice and increasing purposeful exercise. Remains on mounjaro 10 mg, metfomrin 1000 mg qam as well as BP medication; defers statin Encouraged to schedule DM eye exam        Musculoskeletal and Integument   Nail fungus    Chronic, ongoing; currently receiving laser treatments but noting they are cost prohibitive Will start on oral antifungal x12 weeks as long as LFTs are stable      Relevant Medications   terbinafine (LAMISIL) 250 MG tablet     Other   Annual physical exam - Primary    Due for dental and vision Things to do to keep yourself healthy  - Exercise at least 30-45  minutes a day, 3-4 days a week.  - Eat a low-fat diet with lots of fruits and vegetables, up to 7-9 servings per day.  - Seatbelts can save your life. Wear them always.  - Smoke detectors on every level of your home, check batteries every year.  - Eye Doctor - have an eye exam every 1-2 years  - Safe  sex - if you may be exposed to STDs, use a condom.  - Alcohol -  If you drink, do it moderately, less than 2 drinks per day.  - Health Care Power of Attorney. Choose someone to speak for you if you are not able.  - Depression is common in our stressful world.If you're feeling down or losing interest in things you normally enjoy, please come in for a visit.  - Violence - If anyone is threatening or hurting you, please call immediately. Referrals placed for colon and mammo Encouraged to schedule eye exam  Pt wishes to defer vax at this time Foot exam completed      Relevant Orders   Comprehensive Metabolic Panel (CMET) (Completed)   CBC with Differential/Platelet (Completed)   TSH (Completed)   Hemoglobin A1c (Completed)   Lipid panel (Completed)   Avitaminosis D    Chronic, on 50,000 weekly; due for 6 month lab f/u      Relevant Orders   Vitamin D (25 hydroxy) (Completed)   Morbid obesity (HCC)    Improved Body mass index is 39.77 kg/m. Discussed importance of healthy weight management Discussed diet and exercise Associated with HTN, HLD, DM      Relevant Orders   Comprehensive Metabolic Panel (CMET) (Completed)   CBC with Differential/Platelet (Completed)   TSH (Completed)   Hemoglobin A1c (Completed)   Lipid panel (Completed)   Urine Microalbumin w/creat. ratio (Completed)   Screening for cervical cancer    PAP completed; pt tolerated well      Relevant Orders   Cytology - PAP   Screening for colon cancer    Referral placed to GI; pt wishes to defer scheduling at this time d/t cost concerns      Relevant Orders   Ambulatory referral to Gastroenterology   Screening  mammogram for breast cancer    Due for screening for mammogram, denies breast concerns, provided with phone number to call and schedule appointment for mammogram. Encouraged to repeat breast cancer screening every 1-2 years.       Relevant Orders   MM 3D SCREENING MAMMOGRAM BILATERAL BREAST   Return in about 3 months (around 06/17/2023) for chonic disease management.    Leilani Merl, FNP, have reviewed all documentation for this visit. The documentation on 03/19/23 for the exam, diagnosis, procedures, and orders are all accurate and complete.  Jacky Kindle, FNP  [facility] [phone] (phone) [phone] (fax)  [facility]

## 2023-03-19 NOTE — Assessment & Plan Note (Signed)
Chronic, on 50,000 weekly; due for 6 month lab f/u

## 2023-03-19 NOTE — Assessment & Plan Note (Signed)
Due for dental and vision Things to do to keep yourself healthy  - Exercise at least 30-45 minutes a day, 3-4 days a week.  - Eat a low-fat diet with lots of fruits and vegetables, up to 7-9 servings per day.  - Seatbelts can save your life. Wear them always.  - Smoke detectors on every level of your home, check batteries every year.  - Eye Doctor - have an eye exam every 1-2 years  - Safe sex - if you may be exposed to STDs, use a condom.  - Alcohol -  If you drink, do it moderately, less than 2 drinks per day.  - Health Care Power of Attorney. Choose someone to speak for you if you are not able.  - Depression is common in our stressful world.If you're feeling down or losing interest in things you normally enjoy, please come in for a visit.  - Violence - If anyone is threatening or hurting you, please call immediately. Referrals placed for colon and mammo Encouraged to schedule eye exam  Pt wishes to defer vax at this time Foot exam completed

## 2023-03-19 NOTE — Assessment & Plan Note (Signed)
Chronic, previously elevated recommend diet low in saturated fat and regular exercise - 30 min at least 5 times per week Repeat LP Goal LDL 55-70 Remains on omega 3s daily Consider statin start The 10-year ASCVD risk score (Arnett DK, et al., 2019) is: 3.7%

## 2023-03-19 NOTE — Assessment & Plan Note (Signed)
Chronic, denies s/s Remains on 150 mcg Repeat labs as previously off

## 2023-03-19 NOTE — Assessment & Plan Note (Signed)
Chronic, ongoing; currently receiving laser treatments but noting they are cost prohibitive Will start on oral antifungal x12 weeks as long as LFTs are stable

## 2023-03-20 ENCOUNTER — Other Ambulatory Visit: Payer: Self-pay

## 2023-03-20 DIAGNOSIS — D649 Anemia, unspecified: Secondary | ICD-10-CM

## 2023-03-22 ENCOUNTER — Ambulatory Visit: Payer: Self-pay | Admitting: *Deleted

## 2023-03-22 NOTE — Telephone Encounter (Signed)
Patient advised.

## 2023-03-22 NOTE — Telephone Encounter (Signed)
  Chief Complaint: Pt called in for lab results. L   Symptoms: N/A Frequency: N/A Pertinent Negatives: Patient denies N/A Disposition: [] ED /[] Urgent Care (no appt availability in office) / [] Appointment(In office/virtual)/ []  [facility]/ [] Home Care/ [] Refused Recommended Disposition /[] [facility]/ []  Follow-up with PCP Additional Notes: Pt wanted to know when she needed to come back in for the repeat CBC?

## 2023-03-22 NOTE — Telephone Encounter (Signed)
Pt given lab results per notes of Merita Norton, FNP on 03/19/2023 at 4:17 AM.  Pt verbalized understanding.   Pt wanting to know how long she should wait before coming in for the repeat CBC?   I see where Michelle Nasuti sent this same question on 03/20/2023 at 10:18 AM but there isn't a response yet of when Robynn Pane wanted pt to come back in for CBC.   Reason for Disposition  [1] Follow-up call to recent contact AND [2] information only call, no triage required  Answer Assessment - Initial Assessment Questions 1. REASON FOR CALL or QUESTION: "What is your reason for calling today?" or "How can I best help you?" or "What question do you have that I can help answer?"     Pt called in for lab results and had a question  Protocols used: Information Only Call - No Triage-A-AH

## 2023-03-24 LAB — CYTOLOGY - PAP
Chlamydia: NEGATIVE
Comment: NEGATIVE
Comment: NEGATIVE
Comment: NEGATIVE
Comment: NEGATIVE
Comment: NORMAL
Diagnosis: NEGATIVE
HSV1: NEGATIVE
HSV2: NEGATIVE
High risk HPV: NEGATIVE
Neisseria Gonorrhea: NEGATIVE
Trichomonas: NEGATIVE

## 2023-03-24 NOTE — Progress Notes (Signed)
Normal/negative PAP smear; no STI or cellular changes. Repeat in 5 years.

## 2023-04-05 ENCOUNTER — Other Ambulatory Visit: Payer: Self-pay | Admitting: Family Medicine

## 2023-04-05 DIAGNOSIS — E034 Atrophy of thyroid (acquired): Secondary | ICD-10-CM

## 2023-04-05 NOTE — Telephone Encounter (Signed)
Requested Prescriptions  Pending Prescriptions Disp Refills   losartan-hydrochlorothiazide (HYZAAR) 100-25 MG tablet [Pharmacy Med Name: LOSARTAN-HCTZ 100-25 MG TAB] 90 tablet 0    Sig: TAKE 1 TABLET BY MOUTH EVERY DAY     Cardiovascular: ARB + Diuretic Combos Passed - 04/05/2023  2:12 AM      Passed - K in normal range and within 180 days    Potassium  Date Value Ref Range Status  03/17/2023 4.0 3.5 - 5.2 mmol/L Final         Passed - Na in normal range and within 180 days    Sodium  Date Value Ref Range Status  03/17/2023 138 134 - 144 mmol/L Final         Passed - Cr in normal range and within 180 days    Creatinine, Ser  Date Value Ref Range Status  03/17/2023 0.81 0.57 - 1.00 mg/dL Final         Passed - eGFR is 10 or above and within 180 days    GFR calc Af Amer  Date Value Ref Range Status  02/13/2020 97 >59 mL/min/1.73 Final    Comment:    **[facility] currently reports eGFR in compliance with the current**   recommendations of the [employer]. [facility] will   update reporting as new guidelines are published from the NKF-ASN   Task force.    GFR calc non Af Amer  Date Value Ref Range Status  02/13/2020 84 >59 mL/min/1.73 Final   eGFR  Date Value Ref Range Status  03/17/2023 87 >59 mL/min/1.73 Final         Passed - Patient is not pregnant      Passed - Last BP in normal range    BP Readings from Last 1 Encounters:  03/17/23 130/69         Passed - Valid encounter within last 6 months    Recent Outpatient Visits           2 weeks ago Annual physical exam   [facility] Jacky Kindle, FNP   6 months ago Need for COVID-19 vaccine   [facility] Jacky Kindle, FNP   9 months ago Morbid obesity [facility])   [facility] Merita Norton T, FNP   10 months ago Type 2 diabetes mellitus with hyperglycemia, without long-term current use of insulin [facility])   [facility] Merita Norton T, FNP   1 year ago Type 2 diabetes mellitus without complication, without long-term current use of insulin [facility])   [place] [facility] Merita Norton T, [place]               levothyroxine (SYNTHROID) 150 MCG tablet [Pharmacy Med Name: LEVOTHYROXINE 150 MCG TABLET] 90 tablet 0    Sig: TAKE 1 TABLET (150 MCG TOTAL) BY MOUTH DAILY. REPEAT LABS PRIOR TO COMPLETION.     Endocrinology:  Hypothyroid Agents Passed - 04/05/2023  2:12 AM      Passed - TSH in normal range and within 360 days    TSH  Date Value Ref Range Status  03/17/2023 2.840 0.450 - 4.500 uIU/mL Final         Passed - Valid encounter within last 12 months    Recent Outpatient Visits           2 weeks ago Annual physical exam   [facility] Jacky Kindle, FNP  6 months ago Need for COVID-19 vaccine   [facility]y [facility], FNP   9 months ago Morbid obesity [facility])   [facility] Merita Norton T, FNP   10 months ago Type 2 diabetes mellitus with hyperglycemia, without long-term current use of insulin [facility])   [facility] Merita Norton T, FNP   1 year ago Type 2 diabetes mellitus without complication, without long-term current use of insulin [facility])   [facility] Jacky Kindle, [place]

## 2023-04-07 ENCOUNTER — Ambulatory Visit (INDEPENDENT_AMBULATORY_CARE_PROVIDER_SITE_OTHER): Payer: BC Managed Care – PPO | Admitting: *Deleted

## 2023-04-07 DIAGNOSIS — B351 Tinea unguium: Secondary | ICD-10-CM

## 2023-04-07 NOTE — Progress Notes (Signed)
Patient presents today for the 5th laser treatment. Diagnosed with mycotic nail infection by Dr. Allena Katz.    Toenail most affected 1st right.   All other systems are negative.   Nails were filed thin. Laser therapy was administered to 1-5 toenails bilateral and patient tolerated the treatment well. All safety precautions were in place.    Single laser pass was done on non-affected nails.     Follow up in 8 weeks for laser # 6

## 2023-04-30 ENCOUNTER — Encounter: Payer: Self-pay | Admitting: Family Medicine

## 2023-05-02 ENCOUNTER — Other Ambulatory Visit: Payer: Self-pay | Admitting: Family Medicine

## 2023-05-02 MED ORDER — TIRZEPATIDE 12.5 MG/0.5ML ~~LOC~~ SOAJ: 12.5000 mg | SUBCUTANEOUS | 0 refills | Status: DC

## 2023-05-15 ENCOUNTER — Other Ambulatory Visit: Payer: Self-pay | Admitting: Family Medicine

## 2023-05-15 DIAGNOSIS — I152 Hypertension secondary to endocrine disorders: Secondary | ICD-10-CM

## 2023-05-16 NOTE — Telephone Encounter (Signed)
Requested Prescriptions  Pending Prescriptions Disp Refills   amLODipine (NORVASC) 5 MG tablet [Pharmacy Med Name: AMLODIPINE BESYLATE 5 MG TAB] 90 tablet 1    Sig: TAKE 1 TABLET BY MOUTH DAILY. TAKE IN ADDITION TO OTHER BP MEDICATION, HYZAAR (LOSARTAN-HCTZ)     Cardiovascular: Calcium Channel Blockers 2 Passed - 05/15/2023  2:37 AM      Passed - Last BP in normal range    BP Readings from Last 1 Encounters:  03/17/23 130/69         Passed - Last Heart Rate in normal range    Pulse Readings from Last 1 Encounters:  03/17/23 86         Passed - Valid encounter within last 6 months    Recent Outpatient Visits           2 months ago Annual physical exam   [facility]y [facility], FNP   7 months ago Need for COVID-19 vaccine   [facility], FNP   11 months ago Morbid obesity [facility])   [facility] Merita Norton T, FNP   12 months ago Type 2 diabetes mellitus with hyperglycemia, without long-term current use of insulin [facility])   [facility] Merita Norton T, FNP   1 year ago Type 2 diabetes mellitus without complication, without long-term current use of insulin [facility])   [facility] Jacky Kindle, [place]

## 2023-06-14 DIAGNOSIS — Z01818 Encounter for other preprocedural examination: Secondary | ICD-10-CM | POA: Diagnosis not present

## 2023-06-15 ENCOUNTER — Ambulatory Visit
Admission: RE | Admit: 2023-06-15 | Discharge: 2023-06-15 | Disposition: A | Discharge status: Home / Self Care | Payer: BC Managed Care – PPO | Source: Ambulatory Visit | Attending: Family Medicine | Admitting: Family Medicine

## 2023-06-15 DIAGNOSIS — D649 Anemia, unspecified: Secondary | ICD-10-CM | POA: Diagnosis not present

## 2023-06-15 DIAGNOSIS — Z1231 Encounter for screening mammogram for malignant neoplasm of breast: Secondary | ICD-10-CM | POA: Diagnosis not present

## 2023-06-16 ENCOUNTER — Ambulatory Visit (INDEPENDENT_AMBULATORY_CARE_PROVIDER_SITE_OTHER): Payer: BC Managed Care – PPO | Admitting: Podiatry

## 2023-06-16 ENCOUNTER — Other Ambulatory Visit: Payer: Self-pay | Admitting: Family Medicine

## 2023-06-16 DIAGNOSIS — E559 Vitamin D deficiency, unspecified: Secondary | ICD-10-CM

## 2023-06-16 DIAGNOSIS — D509 Iron deficiency anemia, unspecified: Secondary | ICD-10-CM

## 2023-06-16 DIAGNOSIS — E1165 Type 2 diabetes mellitus with hyperglycemia: Secondary | ICD-10-CM

## 2023-06-16 DIAGNOSIS — B351 Tinea unguium: Secondary | ICD-10-CM

## 2023-06-16 MED ORDER — VITAMIN D (ERGOCALCIFEROL) 1.25 MG (50000 UNIT) PO CAPS
50000.0000 [IU] | ORAL_CAPSULE | ORAL | 0 refills | Status: DC
Start: 2023-06-16 — End: 2023-09-15

## 2023-06-16 NOTE — Progress Notes (Signed)
Patient presents today for the 5th laser treatment. Diagnosed with mycotic nail infection by Dr. Allena Katz.    Toenail most affected 1st right.   All other systems are negative.   Nails were filed thin. Laser therapy was administered to 1-5 toenails bilateral and patient tolerated the treatment well. All safety precautions were in place.    Single laser pass was done on non-affected nails.     She is taking lamisil now prescribed by her primary care doctor.  She will wait to see how the nails do on this medication and will call if she would like to continue laser treatments in the future.

## 2023-06-19 NOTE — Group Note (Deleted)

## 2023-06-19 NOTE — Telephone Encounter (Signed)
CRM created. Ok for [facility] to advise

## 2023-06-23 ENCOUNTER — Inpatient Hospital Stay: Payer: BC Managed Care – PPO

## 2023-06-23 ENCOUNTER — Encounter: Payer: Self-pay | Admitting: Oncology

## 2023-06-23 ENCOUNTER — Inpatient Hospital Stay: Payer: BC Managed Care – PPO | Attending: Oncology | Admitting: Oncology

## 2023-06-23 VITALS — BP 121/72 | HR 75 | Temp 97.3°F | Resp 20 | Wt 223.2 lb

## 2023-06-23 DIAGNOSIS — N922 Excessive menstruation at puberty: Secondary | ICD-10-CM

## 2023-06-23 DIAGNOSIS — D509 Iron deficiency anemia, unspecified: Secondary | ICD-10-CM | POA: Insufficient documentation

## 2023-06-23 DIAGNOSIS — N920 Excessive and frequent menstruation with regular cycle: Secondary | ICD-10-CM | POA: Insufficient documentation

## 2023-06-23 LAB — IRON AND TIBC
Iron: 19 ug/dL — ABNORMAL LOW (ref 28–170)
Saturation Ratios: 4 % — ABNORMAL LOW (ref 10.4–31.8)
TIBC: 441 ug/dL (ref 250–450)
UIBC: 422 ug/dL

## 2023-06-23 LAB — CBC WITH DIFFERENTIAL/PLATELET
Abs Immature Granulocytes: 0.02 10*3/uL (ref 0.00–0.07)
Basophils Absolute: 0.1 10*3/uL (ref 0.0–0.1)
Basophils Relative: 1 %
Eosinophils Absolute: 0.2 10*3/uL (ref 0.0–0.5)
Eosinophils Relative: 3 %
HCT: 27.9 % — ABNORMAL LOW (ref 36.0–46.0)
Hemoglobin: 8 g/dL — ABNORMAL LOW (ref 12.0–15.0)
Immature Granulocytes: 0 %
Lymphocytes Relative: 28 %
Lymphs Abs: 1.9 10*3/uL (ref 0.7–4.0)
MCH: 18.3 pg — ABNORMAL LOW (ref 26.0–34.0)
MCHC: 28.7 g/dL — ABNORMAL LOW (ref 30.0–36.0)
MCV: 63.7 fL — ABNORMAL LOW (ref 80.0–100.0)
Monocytes Absolute: 0.6 10*3/uL (ref 0.1–1.0)
Monocytes Relative: 8 %
Neutro Abs: 4 10*3/uL (ref 1.7–7.7)
Neutrophils Relative %: 60 %
Platelets: 435 10*3/uL — ABNORMAL HIGH (ref 150–400)
RBC: 4.38 MIL/uL (ref 3.87–5.11)
RDW: 20.4 % — ABNORMAL HIGH (ref 11.5–15.5)
WBC: 6.9 10*3/uL (ref 4.0–10.5)
nRBC: 0 % (ref 0.0–0.2)

## 2023-06-23 LAB — VITAMIN B12: Vitamin B-12: 222 pg/mL (ref 180–914)

## 2023-06-23 LAB — FOLATE: Folate: 7.6 ng/mL (ref >5.9)

## 2023-06-23 LAB — FERRITIN: Ferritin: 3 ng/mL — ABNORMAL LOW (ref 11–307)

## 2023-06-23 NOTE — Progress Notes (Signed)
Hematology/Oncology Consult note [facility] [phone] [phone]  Patient Care Team: Jacky Kindle, FNP as PCP - General (Family Medicine)   Name of the patient: Kristin Chavez  [id number]  Jul 08, 1971    Reason for referral-iron deficiency anemia   Referring physician-Elise Suzie Portela FNP  Date of visit: 06/23/23   History of presenting illness-patient is a 52 year old female with a past medical history significant for hypertension hyperlipidemia who has been referred for iron deficiency anemia.  CBC from 06/15/2023 showed white count of 7.2, H&H of 7.6/27.5 with an MCV of 66 and a platelet count of 432.  Of note patient has had chronic microcytic anemia with a hemoglobin that has been mostly between 7.5-8.5 over the last 3 years and chronic microcytosis.  Her last colonoscopy was in 2017 by Dr. Marva Panda which showed a 4 mm polyp in the proximal ascending colon which was resected and was negative for malignancy.  No recent iron studies checked.  Patient is currently premenopausal and gets menstrual cycles that last for about 7 days and the first 2 to 3 days can be heavy.  She is getting a repeat colonoscopy on 07/03/2023 with [facility].  ECOG PS- 0  Pain scale- 0   Review of systems- Review of Systems  Constitutional:  Positive for malaise/fatigue. Negative for chills, fever and weight loss.  HENT:  Negative for congestion, ear discharge and nosebleeds.   Eyes:  Negative for blurred vision.  Respiratory:  Negative for cough, hemoptysis, sputum production, shortness of breath and wheezing.   Cardiovascular:  Negative for chest pain, palpitations, orthopnea and claudication.  Gastrointestinal:  Negative for abdominal pain, blood in stool, constipation, diarrhea, heartburn, melena, nausea and vomiting.  Genitourinary:  Negative for dysuria, flank pain, frequency, hematuria and urgency.  Musculoskeletal:  Negative for back pain, joint pain and  myalgias.  Skin:  Negative for rash.  Neurological:  Negative for dizziness, tingling, focal weakness, seizures, weakness and headaches.  Endo/Heme/Allergies:  Does not bruise/bleed easily.  Psychiatric/Behavioral:  Negative for depression and suicidal ideas. The patient does not have insomnia.     No Known Allergies  Patient Active Problem List   Diagnosis Date Noted   Nail fungus 03/19/2023   Screening for cervical cancer 03/19/2023   Annual physical exam 03/17/2023   Metabolic dysfunction-associated steatotic liver disease (MASLD) 03/17/2023   Screening for colon cancer 09/19/2022   Hypothyroidism due to acquired atrophy of thyroid 05/17/2022   Type 2 diabetes mellitus with hyperglycemia, without long-term current use of insulin (HCC) 05/17/2022   Hyperlipidemia associated with type 2 diabetes mellitus (HCC) 05/17/2022   Morbid obesity (HCC) 10/14/2021   Screening mammogram for breast cancer 10/14/2021   Hypertension associated with diabetes (HCC) 02/26/2020   Avitaminosis D 01/15/2016     Past Medical History:  Diagnosis Date   Diabetes mellitus without complication (HCC)    Hypertension    Hypothyroidism    Liver disease, chronic    Thyroid disease    Thyroid disease      Past Surgical History:  Procedure Laterality Date   CESAREAN SECTION     COLONOSCOPY WITH PROPOFOL N/A 04/01/2016   Procedure: COLONOSCOPY WITH PROPOFOL;  Surgeon: Christena Deem, MD;  Location: [facility] END[facility]COPY;  Service: Endoscopy;  Laterality: N/A;   Hx of laparoscopy  1998    Social History   Socioeconomic History   Marital status: Married    Spouse name: Not on file   Number of children: Not on file  Years of education: Not on file   Highest education level: Not on file  Occupational History   Not on file  Tobacco Use   Smoking status: Never   Smokeless tobacco: Never  Substance and Sexual Activity   Alcohol use: No   Drug use: No   Sexual activity: Yes    Birth  control/protection: Other-see comments  Other Topics Concern   Not on file  Social History Narrative   Not on file   Social Determinants of Health   Financial Resource Strain: Not on file  Food Insecurity: Not on file  Transportation Needs: Not on file  Physical Activity: Not on file  Stress: Not on file  Social Connections: Not on file  Intimate Partner Violence: Not on file     Family History  Problem Relation Age of Onset   Hypertension Mother    Breast cancer Paternal Aunt    Cancer Paternal Grandmother      Current Outpatient Medications:    amLODipine (NORVASC) 5 MG tablet, TAKE 1 TABLET BY MOUTH DAILY. TAKE IN ADDITION TO OTHER BP MEDICATION, HYZAAR (LOSARTAN-HCTZ), Disp: 90 tablet, Rfl: 1   ammonium lactate (AMLACTIN DAILY) 12 % lotion, Apply 1 Application topically as needed for dry skin., Disp: 400 g, Rfl: 0   blood glucose meter kit and supplies, Dispense based on patient and insurance preference. Use up to four times daily as directed. (FOR ICD-10 E10.9, E11.9)., Disp: 1 each, Rfl: 0   dicyclomine (BENTYL) 20 MG tablet, Take 1 tablet (20 mg total) by mouth 3 (three) times daily as needed (for cramps)., Disp: 90 tablet, Rfl: 5   glucose blood test strip, Use as instructed, Disp: 100 each, Rfl: 12   levothyroxine (SYNTHROID) 150 MCG tablet, TAKE 1 TABLET (150 MCG TOTAL) BY MOUTH DAILY. REPEAT LABS PRIOR TO COMPLETION., Disp: 90 tablet, Rfl: 0   losartan-hydrochlorothiazide (HYZAAR) 100-25 MG tablet, TAKE 1 TABLET BY MOUTH EVERY DAY, Disp: 90 tablet, Rfl: 0   metFORMIN (GLUCOPHAGE) 500 MG tablet, TAKE 2 TABLETS BY MOUTH EVERY DAY WITH BREAKFAST, Disp: 180 tablet, Rfl: 1   Omega-3 Fatty Acids (FISH OIL OMEGA-3 PO), Take by mouth., Disp: , Rfl:    Probiotic Product (PROBIOTIC DAILY PO), Take by mouth., Disp: , Rfl:    terbinafine (LAMISIL) 250 MG tablet, Take 1 tablet (250 mg total) by mouth daily., Disp: 100 tablet, Rfl: 0   tirzepatide (MOUNJARO) 12.5 MG/0.5ML Pen,  Inject 12.5 mg into the skin once a week., Disp: 6 mL, Rfl: 0   Vitamin D, Ergocalciferol, (DRISDOL) 1.25 MG (50000 UNIT) CAPS capsule, Take 1 capsule (50,000 Units total) by mouth every 7 (seven) days. Repeat labs in 3 months; best taken with meal., Disp: 13 capsule, Rfl: 0   Physical exam:  Vitals:   06/23/23 1317  Weight: 223 lb 3.2 oz (101.2 kg)   Physical Exam Cardiovascular:     Rate and Rhythm: Normal rate and regular rhythm.     Heart sounds: Normal heart sounds.  Pulmonary:     Effort: Pulmonary effort is normal.     Breath sounds: Normal breath sounds.  Abdominal:     General: Bowel sounds are normal.     Palpations: Abdomen is soft.  Musculoskeletal:     Cervical back: Normal range of motion.  Skin:    General: Skin is warm and dry.  Neurological:     Mental Status: She is alert and oriented to person, place, and time.  Latest Ref Rng & Units 03/17/2023    3:21 PM  CMP  Glucose 70 - 99 mg/dL 782   BUN 6 - 24 mg/dL 17   Creatinine 9.56 - 1.00 mg/dL 2.13   Sodium 086 - 578 mmol/L 138   Potassium 3.5 - 5.2 mmol/L 4.0   Chloride 96 - 106 mmol/L 100   CO2 20 - 29 mmol/L 25   Calcium 8.7 - 10.2 mg/dL 9.8   Total Protein 6.0 - 8.5 g/dL 7.5   Total Bilirubin 0.0 - 1.2 mg/dL 0.4   Alkaline Phos 44 - 121 IU/L 61   AST 0 - 40 IU/L 13   ALT 0 - 32 IU/L 12       Latest Ref Rng & Units 06/15/2023    2:09 PM  CBC  WBC 3.4 - 10.8 x10E3/uL 7.2   Hemoglobin 11.1 - 15.9 g/dL 7.6   Hematocrit 46.9 - 46.6 % 27.5   Platelets 150 - 450 x10E3/uL 432     No images are attached to the encounter.  MM 3D SCREENING MAMMOGRAM BILATERAL BREAST  Result Date: 06/16/2023 CLINICAL DATA:  Screening. The patient states an 85 pound weight loss since her mammogram last year. EXAM: DIGITAL SCREENING BILATERAL MAMMOGRAM WITH TOMOSYNTHESIS AND CAD TECHNIQUE: Bilateral screening digital craniocaudal and mediolateral oblique mammograms were obtained. Bilateral screening digital  breast tomosynthesis was performed. The images were evaluated with computer-aided detection. COMPARISON:  Previous exam(s). ACR Breast Density Category b: There are scattered areas of fibroglandular density. FINDINGS: There are no findings suspicious for malignancy. IMPRESSION: No mammographic evidence of malignancy. A result letter of this screening mammogram will be mailed directly to the patient. RECOMMENDATION: Screening mammogram in one year. (Code:SM-B-01Y) BI-RADS CATEGORY  1: Negative. Electronically Signed   By: Hulan Saas M.D.   On: 06/16/2023 12:52    Assessment and plan- Patient is a 52 y.o. female referred for microcytic anemia  Patient has had chronic microcytic anemia with a hemoglobin between 7.5-8.5 for the last 3 years.  Recent iron studies have not been checked.  I will be checking CBC ferritin and iron studies B12 folate celiac disease panel and stool H. pylori antigen.  We will also reach out to [facility] and see if they can add EGD to her colonoscopy especially if there is evidence of iron deficiency based on today's labs.  We will also refer her to GYN for further evaluation of her menorrhagia as a cause of her iron deficiency.  Once labs confirm that she is iron deficient will proceed with IV iron.  Which brand of IV iron we gave her will depend on what her insurance will allow.  Discussed risks and benefits of IV iron including all but not limited to possible risk of infusion reactions.  Patient understands and agrees to proceed as planned.  Repeat CBC ferritin and iron studies in 2 months and I will see her thereafter.   Thank you for this kind referral and the opportunity to participate in the care of this patient   Visit Diagnosis 1. Microcytic anemia     Dr. Owens Shark, MD, MPH [facility] [phone] 06/23/2023

## 2023-06-23 NOTE — Addendum Note (Signed)
Addended by: Corene Cornea on: 06/23/2023 01:57 PM   Modules accepted: Orders

## 2023-06-25 LAB — CELIAC DISEASE PANEL
Endomysial Ab, IgA: NEGATIVE
IgA: 307 mg/dL (ref 87–352)
Tissue Transglutaminase Ab, IgA: <2 U/mL (ref 0–3)

## 2023-06-26 ENCOUNTER — Other Ambulatory Visit: Payer: Self-pay | Admitting: *Deleted

## 2023-06-27 DIAGNOSIS — D509 Iron deficiency anemia, unspecified: Secondary | ICD-10-CM | POA: Diagnosis not present

## 2023-06-27 DIAGNOSIS — Z86010 Personal history of colonic polyps: Secondary | ICD-10-CM | POA: Diagnosis not present

## 2023-06-27 DIAGNOSIS — Z83719 Family history of colon polyps, unspecified: Secondary | ICD-10-CM | POA: Diagnosis not present

## 2023-07-03 ENCOUNTER — Ambulatory Visit: Payer: BC Managed Care – PPO

## 2023-07-03 DIAGNOSIS — K449 Diaphragmatic hernia without obstruction or gangrene: Secondary | ICD-10-CM | POA: Diagnosis not present

## 2023-07-03 DIAGNOSIS — K640 First degree hemorrhoids: Secondary | ICD-10-CM | POA: Diagnosis not present

## 2023-07-03 DIAGNOSIS — D509 Iron deficiency anemia, unspecified: Secondary | ICD-10-CM | POA: Diagnosis not present

## 2023-07-03 DIAGNOSIS — Z86010 Personal history of colonic polyps: Secondary | ICD-10-CM | POA: Diagnosis not present

## 2023-07-03 DIAGNOSIS — Z09 Encounter for follow-up examination after completed treatment for conditions other than malignant neoplasm: Secondary | ICD-10-CM | POA: Diagnosis not present

## 2023-07-03 DIAGNOSIS — Z1211 Encounter for screening for malignant neoplasm of colon: Secondary | ICD-10-CM | POA: Diagnosis not present

## 2023-07-06 ENCOUNTER — Other Ambulatory Visit: Payer: Self-pay | Admitting: Oncology

## 2023-07-06 ENCOUNTER — Encounter: Payer: Self-pay | Admitting: Oncology

## 2023-07-06 ENCOUNTER — Inpatient Hospital Stay: Payer: BC Managed Care – PPO

## 2023-07-06 VITALS — BP 118/69 | HR 64 | Temp 96.0°F | Resp 16

## 2023-07-06 DIAGNOSIS — D509 Iron deficiency anemia, unspecified: Secondary | ICD-10-CM | POA: Diagnosis not present

## 2023-07-06 DIAGNOSIS — D508 Other iron deficiency anemias: Secondary | ICD-10-CM

## 2023-07-06 DIAGNOSIS — N920 Excessive and frequent menstruation with regular cycle: Secondary | ICD-10-CM | POA: Diagnosis not present

## 2023-07-06 MED ORDER — SODIUM CHLORIDE 0.9 % IV SOLN
510.0000 mg | INTRAVENOUS | Status: DC
  Administered 2023-07-06: 510 mg via INTRAVENOUS
  Filled 2023-07-06: qty 510

## 2023-07-06 MED ORDER — SODIUM CHLORIDE 0.9 % IV SOLN
Freq: Once | INTRAVENOUS | Status: AC
  Filled 2023-07-06: qty 250

## 2023-07-06 MED ORDER — CYANOCOBALAMIN 1000 MCG/ML IJ SOLN
1000.0000 ug | INTRAMUSCULAR | Status: DC
  Administered 2023-07-06: 1000 ug via INTRAMUSCULAR
  Filled 2023-07-06: qty 1

## 2023-07-06 NOTE — Patient Instructions (Signed)
 Ferumoxytol Injection What is this medication? FERUMOXYTOL (FER ue MOX i tol) treats low levels of iron in your body (iron deficiency anemia). Iron is a mineral that plays an important role in making red blood cells, which carry oxygen from your lungs to the rest of your body. This medicine may be used for other purposes; ask your health care provider or pharmacist if you have questions. COMMON BRAND NAME(S): Feraheme What should I tell my care team before I take this medication? They need to know if you have any of these conditions: Anemia not caused by low iron levels High levels of iron in the blood Magnetic resonance imaging (MRI) test scheduled An unusual or allergic reaction to iron, other medications, foods, dyes, or preservatives Pregnant or trying to get pregnant Breastfeeding How should I use this medication? This medication is injected into a vein. It is given by your care team in a hospital or clinic setting. Talk to your care team the use of this medication in children. Special care may be needed. Overdosage: If you think you have taken too much of this medicine contact a poison control center or emergency room at once. NOTE: This medicine is only for you. Do not share this medicine with others. What if I miss a dose? It is important not to miss your dose. Call your care team if you are unable to keep an appointment. What may interact with this medication? Other iron products This list may not describe all possible interactions. Give your health care provider a list of all the medicines, herbs, non-prescription drugs, or dietary supplements you use. Also tell them if you smoke, drink alcohol, or use illegal drugs. Some items may interact with your medicine. What should I watch for while using this medication? Visit your care team regularly. Tell your care team if your symptoms do not start to get better or if they get worse. You may need blood work done while you are taking this  medication. You may need to follow a special diet. Talk to your care team. Foods that contain iron include: whole grains/cereals, dried fruits, beans, or peas, leafy green vegetables, and organ meats (liver, kidney). What side effects may I notice from receiving this medication? Side effects that you should report to your care team as soon as possible: Allergic reactions--skin rash, itching, hives, swelling of the face, lips, tongue, or throat Low blood pressure--dizziness, feeling faint or lightheaded, blurry vision Shortness of breath Side effects that usually do not require medical attention (report to your care team if they continue or are bothersome): Flushing Headache Joint pain Muscle pain Nausea Pain, redness, or irritation at injection site This list may not describe all possible side effects. Call your doctor for medical advice about side effects. You may report side effects to FDA at 1-800-FDA-1088. Where should I keep my medication? This medication is given in a hospital or clinic. It will not be stored at home. NOTE: This sheet is a summary. It may not cover all possible information. If you have questions about this medicine, talk to your doctor, pharmacist, or health care provider.  2024 Elsevier/Gold Standard (2023-03-03 00:00:00)

## 2023-07-13 ENCOUNTER — Inpatient Hospital Stay: Payer: BC Managed Care – PPO | Attending: Oncology

## 2023-07-13 ENCOUNTER — Other Ambulatory Visit: Payer: Self-pay | Admitting: Family Medicine

## 2023-07-13 VITALS — BP 126/75 | HR 68 | Temp 98.9°F

## 2023-07-13 DIAGNOSIS — D508 Other iron deficiency anemias: Secondary | ICD-10-CM

## 2023-07-13 DIAGNOSIS — E034 Atrophy of thyroid (acquired): Secondary | ICD-10-CM

## 2023-07-13 DIAGNOSIS — D509 Iron deficiency anemia, unspecified: Secondary | ICD-10-CM | POA: Diagnosis not present

## 2023-07-13 MED ORDER — SODIUM CHLORIDE 0.9 % IV SOLN
510.0000 mg | INTRAVENOUS | Status: DC
  Administered 2023-07-13: 510 mg via INTRAVENOUS
  Filled 2023-07-13: qty 510

## 2023-07-13 MED ORDER — SODIUM CHLORIDE 0.9 % IV SOLN
Freq: Once | INTRAVENOUS | Status: AC
  Filled 2023-07-13: qty 250

## 2023-07-13 MED ORDER — CYANOCOBALAMIN 1000 MCG/ML IJ SOLN
1000.0000 ug | INTRAMUSCULAR | Status: DC
  Administered 2023-07-13: 1000 ug via INTRAMUSCULAR

## 2023-07-13 NOTE — Telephone Encounter (Signed)
Requested Prescriptions  Pending Prescriptions Disp Refills   losartan-hydrochlorothiazide (HYZAAR) 100-25 MG tablet [Pharmacy Med Name: LOSARTAN-HCTZ 100-25 MG TAB] 90 tablet 0    Sig: TAKE 1 TABLET BY MOUTH EVERY DAY     Cardiovascular: ARB + Diuretic Combos Passed - 07/13/2023  1:37 AM      Passed - K in normal range and within 180 days    Potassium  Date Value Ref Range Status  03/17/2023 4.0 3.5 - 5.2 mmol/L Final         Passed - Na in normal range and within 180 days    Sodium  Date Value Ref Range Status  03/17/2023 138 134 - 144 mmol/L Final         Passed - Cr in normal range and within 180 days    Creatinine, Ser  Date Value Ref Range Status  03/17/2023 0.81 0.57 - 1.00 mg/dL Final         Passed - eGFR is 10 or above and within 180 days    GFR calc Af Amer  Date Value Ref Range Status  02/13/2020 97 >59 mL/min/1.73 Final    Comment:    **[facility] currently reports eGFR in compliance with the current**   recommendations of the [employer]. [facility] will   update reporting as new guidelines are published from the NKF-ASN   Task force.    GFR calc non Af Amer  Date Value Ref Range Status  02/13/2020 84 >59 mL/min/1.73 Final   eGFR  Date Value Ref Range Status  03/17/2023 87 >59 mL/min/1.73 Final         Passed - Patient is not pregnant      Passed - Last BP in normal range    BP Readings from Last 1 Encounters:  07/06/23 118/69         Passed - Valid encounter within last 6 months    Recent Outpatient Visits           3 months ago Annual physical exam   [facility] Jacky Kindle, FNP   9 months ago Need for COVID-19 vaccine   [facility] Jacky Kindle, FNP   1 year ago Morbid obesity [facility])   [facility] Merita Norton T, FNP   1 year ago Type 2 diabetes mellitus with hyperglycemia, without long-term current use of insulin [facility])   [facility] Merita Norton T, FNP   1 year ago Type 2 diabetes mellitus without complication, without long-term current use of insulin [facility])   [facility] Merita Norton T, [place]               levothyroxine (SYNTHROID) 150 MCG tablet [Pharmacy Med Name: LEVOTHYROXINE 150 MCG TABLET] 90 tablet 0    Sig: TAKE 1 TABLET (150 MCG TOTAL) BY MOUTH DAILY. REPEAT LABS PRIOR TO COMPLETION.     Endocrinology:  Hypothyroid Agents Passed - 07/13/2023  1:37 AM      Passed - TSH in normal range and within 360 days    TSH  Date Value Ref Range Status  03/17/2023 2.840 0.450 - 4.500 uIU/mL Final         Passed - Valid encounter within last 12 months    Recent Outpatient Visits           3 months ago Annual physical exam   [facility] Jacky Kindle, FNP  9 months ago Need for COVID-19 vaccine   [facility] Jacky Kindle, FNP   1 year ago Morbid obesity [facility])   [place] [facility] Merita Norton T, FNP   1 year ago Type 2 diabetes mellitus with hyperglycemia, without long-term current use of insulin [facility])   [place] Merita Norton T, FNP   1 year ago Type 2 diabetes mellitus without complication, without long-term current use of insulin [facility])   [facility] Jacky Kindle, [place]

## 2023-07-13 NOTE — Progress Notes (Signed)
GYNECOLOGY ANNUAL PHYSICAL EXAM PROGRESS NOTE  Subjective:    Kristin Chavez is a 52 y.o. No obstetric history on file. female who presents for as a referral from her PCP for menorrhagia and chronic anemia.  Currently receiving iron infusions.  Patient reports that this is not a new issue for her. She has been dealing with anemia since 2017.  She has had menorrhagia since onset of menarche at 52 years of age. Denies chest pain, SOB, dizziness. Has not had any evaluation to date for the cause of her menorrhagia. Does have associated significant cramping. Days 2-3 are typically the heaviest for her, however only changes a pad every 3 hours.  Menstrual History: Menarche age: 24 No LMP recorded (lmp unknown). Period Cycle (Days): 30 Period Duration (Days): 7 Period Pattern: Regular Menstrual Flow: Heavy Menstrual Control: Hospital pad, Tampon Menstrual Control Change Freq (Hours): 2-3 Dysmenorrhea: (!) Severe Dysmenorrhea Symptoms: Headache, Other (Comment), Cramping   Gynecologic History:  Contraception: none History of STI's: HSV Last Pap: 03/17/2023. Results were: normal. Notes h/o abnormal pap smears. Last mammogram: 06/15/2023. Results were: normal Last colonoscopy: 07/11/2023. Results were: normal   OB History  No obstetric history on file.    Past Medical History:  Diagnosis Date   Diabetes mellitus without complication (HCC)    Hypertension    Hypothyroidism    Liver disease, chronic    Thyroid disease    Thyroid disease     Past Surgical History:  Procedure Laterality Date   CESAREAN SECTION     COLONOSCOPY WITH PROPOFOL N/A 04/01/2016   Procedure: COLONOSCOPY WITH PROPOFOL;  Surgeon: Christena Deem, MD;  Location: [facility] ENDOSCOPY;  Service: Endoscopy;  Laterality: N/A;   Hx of laparoscopy  1998    Family History  Problem Relation Age of Onset   Hypertension Mother    Cancer Paternal Grandmother    Ovarian cancer Paternal Grandmother    Breast cancer  Paternal Aunt    Uterine cancer Paternal Aunt    Breast cancer Maternal Great-grandmother     Social History   Socioeconomic History   Marital status: Married    Spouse name: Not on file   Number of children: Not on file   Years of education: Not on file   Highest education level: Not on file  Occupational History   Not on file  Tobacco Use   Smoking status: Never   Smokeless tobacco: Never  Substance and Sexual Activity   Alcohol use: No   Drug use: No   Sexual activity: Yes    Birth control/protection: Other-see comments  Other Topics Concern   Not on file  Social History Narrative   Not on file   Social Determinants of Health   Financial Resource Strain: Not on file  Food Insecurity: Not on file  Transportation Needs: Not on file  Physical Activity: Not on file  Stress: Not on file  Social Connections: Not on file  Intimate Partner Violence: Not on file    Current Outpatient Medications on File Prior to Visit  Medication Sig Dispense Refill   amLODipine (NORVASC) 5 MG tablet TAKE 1 TABLET BY MOUTH DAILY. TAKE IN ADDITION TO OTHER BP MEDICATION, HYZAAR (LOSARTAN-HCTZ) 90 tablet 1   ammonium lactate (AMLACTIN DAILY) 12 % lotion Apply 1 Application topically as needed for dry skin. 400 g 0   blood glucose meter kit and supplies Dispense based on patient and insurance preference. Use up to four times daily as directed. (FOR ICD-10  E10.9, E11.9). 1 each 0   dicyclomine (BENTYL) 20 MG tablet Take 1 tablet (20 mg total) by mouth 3 (three) times daily as needed (for cramps). 90 tablet 5   glucose blood test strip Use as instructed 100 each 12   levothyroxine (SYNTHROID) 150 MCG tablet TAKE 1 TABLET (150 MCG TOTAL) BY MOUTH DAILY. REPEAT LABS PRIOR TO COMPLETION. 90 tablet 0   losartan-hydrochlorothiazide (HYZAAR) 100-25 MG tablet TAKE 1 TABLET BY MOUTH EVERY DAY 90 tablet 0   metFORMIN (GLUCOPHAGE) 500 MG tablet TAKE 2 TABLETS BY MOUTH EVERY DAY WITH BREAKFAST 180 tablet 1    Omega-3 Fatty Acids (FISH OIL OMEGA-3 PO) Take by mouth.     Probiotic Product (PROBIOTIC DAILY PO) Take by mouth.     terbinafine (LAMISIL) 250 MG tablet Take 1 tablet (250 mg total) by mouth daily. 100 tablet 0   tirzepatide (MOUNJARO) 12.5 MG/0.5ML Pen Inject 12.5 mg into the skin once a week. 6 mL 0   Vitamin D, Ergocalciferol, (DRISDOL) 1.25 MG (50000 UNIT) CAPS capsule Take 1 capsule (50,000 Units total) by mouth every 7 (seven) days. Repeat labs in 3 months; best taken with meal. 13 capsule 0   No current facility-administered medications on file prior to visit.    No Known Allergies   Review of Systems Comprehensive review of systems negative except what is noted in HPI.   Objective:  Blood pressure 114/67, pulse 77, resp. rate 16, height 5\' 6"  (1.676 m), weight 221 lb 6.4 oz (100.4 kg), last menstrual period 06/25/2023. Body mass index is 35.73 kg/m.  General Appearance:    Alert, cooperative, no distress, appears stated age  Head:    Normocephalic, without obvious abnormality, atraumatic  Eyes:    PERRL, conjunctiva/corneas clear, EOM's intact, both eyes  Ears:    Normal external ear canals, both ears  Nose:   Nares normal, septum midline, mucosa normal, no drainage or sinus tenderness  Throat:   Lips, mucosa, and tongue normal; teeth and gums normal  Neck:   Supple, symmetrical, trachea midline, no adenopathy; thyroid: no enlargement/tenderness/nodules; no carotid bruit or JVD  Back:     Symmetric, no curvature, ROM normal, no CVA tenderness  Lungs:     Clear to auscultation bilaterally, respirations unlabored  Chest Wall:    No tenderness or deformity   Heart:    Regular rate and rhythm, S1 and S2 normal, no murmur, rub or gallop  Breast Exam:    No tenderness, masses, or nipple abnormality  Abdomen:     Soft, non-tender, bowel sounds active all four quadrants, no masses, no organomegaly.    Genitalia:    Pelvic:external genitalia normal, vagina without lesions,  discharge, or tenderness, rectovaginal septum  normal. Cervix normal in appearance, no cervical motion tenderness, no adnexal masses or tenderness.  Uterus normal size, shape, mobile, regular contours, nontender.  Rectal:    Normal external sphincter.  No hemorrhoids appreciated. Internal exam not done.   Extremities:   Extremities normal, atraumatic, no cyanosis or edema  Pulses:   2+ and symmetric all extremities  Skin:   Skin color, texture, turgor normal, no rashes or lesions  Lymph nodes:   Cervical, supraclavicular, and axillary nodes normal  Neurologic:   CNII-XII intact, normal strength, sensation and reflexes throughout   .  Labs:     Latest Ref Rng & Units 06/23/2023    1:51 PM 06/15/2023    2:09 PM 03/17/2023    3:21 PM  CBC  WBC  4.0 - 10.5 K/uL 6.9  7.2  8.2   Hemoglobin 12.0 - 15.0 g/dL 8.0  7.6  7.9   Hematocrit 36.0 - 46.0 % 27.9  27.5  28.6   Platelets 150 - 400 K/uL 435  432  501      Lab Results  Component Value Date   CREATININE 0.81 03/17/2023   BUN 17 03/17/2023   NA 138 03/17/2023   K 4.0 03/17/2023   CL 100 03/17/2023   CO2 25 03/17/2023    Lab Results  Component Value Date   ALT 12 03/17/2023   AST 13 03/17/2023   GGT 13 03/17/2023   ALKPHOS 61 03/17/2023   BILITOT 0.4 03/17/2023    Lab Results  Component Value Date   TSH 2.840 03/17/2023     Assessment:   1. Menorrhagia with regular cycle   2. Anemia due to blood loss, chronic      Plan:  1. Menorrhagia with regular cycle - Patient has heavy menstrual uterine bleeding.  Previously noted normal exam on recent physical performed by PCP in June.  Will defer exam today.  Will order abnormal uterine bleeding evaluation with pelvic ultrasound to evaluate for any structural gynecologic abnormalities.  Patient will also follow-up in 3 to 4 weeks to review ultrasound results and undergo endometrial biopsy for further evaluation of bleeding.  Exam to be performed at that time. - Discussed management  options for her heavy uterine bleeding including NSAIDs (Naproxen), tranexamic acid (Lysteda), oral progesterone, Depo Provera, Levonogestrel IUD, endometrial ablation or hysterectomy as definitive surgical management.  Discussed risks and benefits of each method.  Patient interested in information regarding IUD and endometrial ablation.  Also has questions regarding how these might affect her potential risk factors for cancer as she does have a family history.  I reviewed patient's medical history.  She has several family members with history of reproductive cancer, although only 1 with potential uterine cancer history.  I discussed that if her ultrasound and endometrial biopsy was negative then her risk at this time was low however cannot predict future risk based on this alone.  I additionally offered patient hereditary cancer screening which she accepted.  Noted that this could also be helpful in making a decision on management options.     - US PELVIC COMPLETE WITH TRANSVAGINAL; Future  2. Anemia due to blood loss, chronic -Likely secondary to patient's menorrhagia.  Is undergoing iron infusion therapy at this time, has completed 2 infusions.  Currently asymptomatic.  Once menorrhagia is managed, most likely will help to manage her anemia.  3.  Family history of cancer -Patient with a family history of reproductive cancers in first and second-degree relatives.  I discussed the option of hereditary genetic cancer screening at office today.  Okay to perform.  Notes that this will also help him make a decision regarding further management options.  States that she thinks her sister may have also been tested and was told that she was negative however patient's sister did still desire proceed with a hysterectomy anyways due to her issues with bleeding.    Hildred Laser, MD [facility]   A total of 47 minutes were spent during this encounter, including review of previous progress  notes, recent imaging and labs, face-to-face with time with patient involving counseling and coordination of care, as well as documentation for current visit.

## 2023-07-18 ENCOUNTER — Ambulatory Visit: Payer: BC Managed Care – PPO | Admitting: Obstetrics and Gynecology

## 2023-07-18 ENCOUNTER — Encounter: Payer: Self-pay | Admitting: Obstetrics and Gynecology

## 2023-07-18 VITALS — BP 114/67 | HR 77 | Resp 16 | Ht 66.0 in | Wt 221.4 lb

## 2023-07-18 DIAGNOSIS — Z809 Family history of malignant neoplasm, unspecified: Secondary | ICD-10-CM

## 2023-07-18 DIAGNOSIS — N920 Excessive and frequent menstruation with regular cycle: Secondary | ICD-10-CM | POA: Diagnosis not present

## 2023-07-18 DIAGNOSIS — D500 Iron deficiency anemia secondary to blood loss (chronic): Secondary | ICD-10-CM | POA: Diagnosis not present

## 2023-07-18 NOTE — Patient Instructions (Signed)
Menorrhagia Menorrhagia is a form of abnormal uterine bleeding in which menstrual periods are heavy or last longer than normal. With menorrhagia, the periods may cause enough blood loss and cramping that a woman becomes unable to take part in her usual activities. What are the causes? Common causes of this condition include: Polyps or fibroids. These are noncancerous growths in the uterus. An imbalance of the hormones estrogen and progesterone. Anovulation, which occurs when one of the ovaries does not release an egg during one or more months. A problem with the thyroid gland (hypothyroidism). Side effects of having an intrauterine device (IUD). Side effects of some medicines, such as NSAIDs or blood thinners. A bleeding disorder that stops the blood from clotting normally. In some cases, the cause of this condition is not known. What increases the risk? You are more likely to develop this condition if you have cancer of the uterus. What are the signs or symptoms? Symptoms of this condition include: Routinely having to change your pad or tampon every 1-2 hours because it is soaked. Needing to use pads and tampons at the same time because of heavy bleeding. Needing to wake up to change your pads or tampons during the night. Passing blood clots larger than 1 inch (2.5 cm) in size. Having bleeding that lasts for more than 7 days. Having symptoms of low iron levels (anemia), such as tiredness (fatigue) or shortness of breath. How is this diagnosed? This condition may be diagnosed based on: A physical exam. Your symptoms and menstrual history. Tests, such as: Blood tests to check if you are pregnant or if you have hormonal changes, a bleeding or thyroid disorder, anemia, or other problems. Pap test to check for cancerous changes, infections, or inflammation. Endometrial biopsy. This test involves removing a tissue sample from the lining of the uterus (endometrium) to be examined under a  microscope. Pelvic ultrasound. This test uses sound waves to create images of your uterus, ovaries, and vagina. The images can show if you have fibroids or other growths. Hysteroscopy. For this test, a thin, flexible tube with a light on the end (hysteroscope) is used to look inside your uterus. How is this treated? Treatment may not be needed for this condition. If it is needed, the best treatment for you will depend on: Whether you need to prevent pregnancy. Your desire to have children in the future. The cause and severity of your bleeding. Your personal preference. Medicine Medicines are the first step in treatment. You may be treated with: Hormonal birth control methods. These treatments reduce bleeding during your menstrual period. They include: Birth control pills. Skin patch. Vaginal ring. Shots (injections) that you get every 3 months. Hormonal IUD. Implants that go under the skin. Medicines that thicken the blood and slow bleeding. Medicines that reduce swelling, such as ibuprofen. Medicines that contain an artificial (synthetic) hormone called progestin. Medicines that make the ovaries stop working for a short time. Iron supplements to treat anemia.  Surgery If medicines do not work, surgery may be done. Surgical options may include: Dilation and curettage (D&C). In this procedure, your health care provider opens the lowest part of the uterus (cervix) and then scrapes or suctions tissue from the endometrium. This reduces menstrual bleeding. Operative hysteroscopy. In this procedure, a hysteroscope is used to view your uterus and help remove polyps that may be causing heavy periods. Endometrial ablation. This is when various techniques are used to permanently destroy your entire endometrium. After endometrial ablation, most women have little   or no menstrual flow. This procedure reduces your ability to become pregnant. Endometrial resection. In this procedure, an electrosurgical  wire loop is used to remove the endometrium. This procedure reduces your ability to become pregnant. Hysterectomy. This is surgical removal of your uterus. This is a permanent procedure that stops menstrual periods. Pregnancy is not possible after a hysterectomy. Follow these instructions at home: Medicines Take over-the-counter and prescription medicines only as told by your health care provider. This includes iron pills. Do not change or switch medicines without asking your health care provider. Do not take aspirin or medicines that contain aspirin 1 week before or during your menstrual period. Aspirin may make bleeding worse. Managing constipation Your iron pills may cause constipation. If you are taking prescription iron supplements, you may need to take these actions to prevent or treat constipation: Drink enough fluid to keep your urine pale yellow. Take over-the-counter or prescription medicines. Eat foods that are high in fiber, such as beans, whole grains, and fresh fruits and vegetables. Limit foods that are high in fat and processed sugars, such as fried or sweet foods. General instructions If you need to change your sanitary pad or tampon more than once every 2 hours, limit your activity until the bleeding stops. Eat well-balanced meals, including foods that are high in iron. Foods that have a lot of iron include leafy green vegetables, meat, liver, eggs, and whole-grain breads and cereals. Do not try to lose weight until the abnormal bleeding has stopped and your blood iron level is back to normal. If you need to lose weight, work with your health care provider to lose weight safely. Keep all follow-up visits. This is important. Contact a health care provider if: You soak through a pad or tampon every 1 or 2 hours, and this happens every time you have a period. You need to use pads and tampons at the same time because you are bleeding so much. You have nausea, vomiting, diarrhea, or  other problems related to medicines you are taking. Get help right away if: You soak through more than a pad or tampon in 1 hour. You pass clots bigger than 1 inch (2.5 cm) wide. You feel short of breath. You feel like your heart is beating too fast. You feel dizzy or you faint. You feel very weak or tired. Summary Menorrhagia is a form of abnormal uterine bleeding in which menstrual periods are heavy or last longer than normal. Treatment may not be needed for this condition. If it is needed, it may include medicines or procedures. Take over-the-counter and prescription medicines only as told by your health care provider. This includes iron pills. Get help right away if you have heavy bleeding that soaks through more than a pad or tampon in 1 hour, you pass large clots, or you feel dizzy, short of breath, or very weak or tired. This information is not intended to replace advice given to you by your health care provider. Make sure you discuss any questions you have with your health care provider. Document Revised: 06/09/2020 Document Reviewed: 06/09/2020 Elsevier Patient Education  2024 [employer].

## 2023-08-01 ENCOUNTER — Ambulatory Visit
Admission: RE | Admit: 2023-08-01 | Discharge: 2023-08-01 | Disposition: A | Discharge status: Home / Self Care | Payer: BC Managed Care – PPO | Source: Ambulatory Visit | Attending: Obstetrics and Gynecology | Admitting: Obstetrics and Gynecology

## 2023-08-01 DIAGNOSIS — N920 Excessive and frequent menstruation with regular cycle: Secondary | ICD-10-CM | POA: Diagnosis not present

## 2023-08-01 DIAGNOSIS — D500 Iron deficiency anemia secondary to blood loss (chronic): Secondary | ICD-10-CM | POA: Diagnosis not present

## 2023-08-01 DIAGNOSIS — D259 Leiomyoma of uterus, unspecified: Secondary | ICD-10-CM | POA: Diagnosis not present

## 2023-08-03 ENCOUNTER — Other Ambulatory Visit: Payer: Self-pay | Admitting: Family Medicine

## 2023-08-04 NOTE — Telephone Encounter (Signed)
Requested medications are due for refill today.  yes  Requested medications are on the active medications list.  yes  Last refill. 05/02/2023 6mL 0 rf  Future visit scheduled.   no  Notes to clinic.  Medication not assigned to a protocol. Please review for refill.    Requested Prescriptions  Pending Prescriptions Disp Refills   MOUNJARO 12.5 MG/0.5ML Pen [Pharmacy Med Name: MOUNJARO 12.5 MG/0.5 ML PEN]      Sig: INJECT 12.5 MG SUBCUTANEOUSLY ONE TIME PER WEEK     Off-Protocol Failed - 08/03/2023 10:21 AM      Failed - Medication not assigned to a protocol, review manually.      Passed - Valid encounter within last 12 months    Recent Outpatient Visits           4 months ago Annual physical exam   [facility] Jacky Kindle, FNP   10 months ago Need for COVID-19 vaccine   [facility] Jacky Kindle, FNP   1 year ago Morbid obesity [facility])   [place] [facility] Merita Norton T, FNP   1 year ago Type 2 diabetes mellitus with hyperglycemia, without long-term current use of insulin [facility])   [facility] Merita Norton T, FNP   1 year ago Type 2 diabetes mellitus without complication, without long-term current use of insulin [facility])    [facility] Jacky Kindle, [place]

## 2023-08-07 ENCOUNTER — Encounter: Payer: Self-pay | Admitting: Family Medicine

## 2023-08-07 ENCOUNTER — Other Ambulatory Visit: Payer: Self-pay | Admitting: Family Medicine

## 2023-08-07 MED ORDER — TIRZEPATIDE 15 MG/0.5ML ~~LOC~~ SOAJ: 15.0000 mg | SUBCUTANEOUS | 3 refills | Status: DC

## 2023-08-08 ENCOUNTER — Other Ambulatory Visit: Payer: Self-pay | Admitting: Family Medicine

## 2023-08-08 MED ORDER — TIRZEPATIDE 12.5 MG/0.5ML ~~LOC~~ SOAJ: 12.5000 mg | SUBCUTANEOUS | 4 refills | Status: DC

## 2023-08-09 ENCOUNTER — Ambulatory Visit: Payer: BC Managed Care – PPO | Admitting: Obstetrics

## 2023-08-16 ENCOUNTER — Ambulatory Visit: Payer: BC Managed Care – PPO | Admitting: Obstetrics and Gynecology

## 2023-08-22 ENCOUNTER — Other Ambulatory Visit: Payer: Self-pay | Admitting: *Deleted

## 2023-08-22 ENCOUNTER — Other Ambulatory Visit: Payer: Self-pay

## 2023-08-22 DIAGNOSIS — N922 Excessive menstruation at puberty: Secondary | ICD-10-CM

## 2023-08-22 DIAGNOSIS — D508 Other iron deficiency anemias: Secondary | ICD-10-CM

## 2023-08-22 DIAGNOSIS — D509 Iron deficiency anemia, unspecified: Secondary | ICD-10-CM

## 2023-08-23 ENCOUNTER — Encounter: Payer: Self-pay | Admitting: Oncology

## 2023-08-23 ENCOUNTER — Other Ambulatory Visit: Payer: BC Managed Care – PPO

## 2023-08-23 ENCOUNTER — Inpatient Hospital Stay: Payer: BC Managed Care – PPO | Attending: Oncology

## 2023-08-23 ENCOUNTER — Inpatient Hospital Stay (HOSPITAL_BASED_OUTPATIENT_CLINIC_OR_DEPARTMENT_OTHER): Payer: BC Managed Care – PPO | Admitting: Oncology

## 2023-08-23 ENCOUNTER — Ambulatory Visit: Payer: BC Managed Care – PPO | Admitting: Oncology

## 2023-08-23 DIAGNOSIS — D508 Other iron deficiency anemias: Secondary | ICD-10-CM | POA: Diagnosis not present

## 2023-08-23 DIAGNOSIS — N920 Excessive and frequent menstruation with regular cycle: Secondary | ICD-10-CM | POA: Diagnosis not present

## 2023-08-23 DIAGNOSIS — N922 Excessive menstruation at puberty: Secondary | ICD-10-CM

## 2023-08-23 DIAGNOSIS — D509 Iron deficiency anemia, unspecified: Secondary | ICD-10-CM | POA: Diagnosis not present

## 2023-08-23 LAB — CBC
HCT: 34.9 % — ABNORMAL LOW (ref 36.0–46.0)
Hemoglobin: 11.1 g/dL — ABNORMAL LOW (ref 12.0–15.0)
MCH: 25.5 pg — ABNORMAL LOW (ref 26.0–34.0)
MCHC: 31.8 g/dL (ref 30.0–36.0)
MCV: 80.2 fL (ref 80.0–100.0)
Platelets: 257 10*3/uL (ref 150–400)
RBC: 4.35 MIL/uL (ref 3.87–5.11)
WBC: 6.9 10*3/uL (ref 4.0–10.5)
nRBC: 0 % (ref 0.0–0.2)

## 2023-08-23 LAB — FERRITIN: Ferritin: 21 ng/mL (ref 11–307)

## 2023-08-23 LAB — IRON AND TIBC
Iron: 37 ug/dL (ref 28–170)
Saturation Ratios: 11 % (ref 10.4–31.8)
TIBC: 330 ug/dL (ref 250–450)
UIBC: 293 ug/dL

## 2023-08-23 NOTE — Progress Notes (Signed)
Hematology/Oncology Consult note [facility]  [phone] [phone]) [phone]  Patient Care Team: Jacky Kindle, FNP as PCP - General (Family Medicine) Creig Hines, MD as Consulting Physician (Oncology)   Name of the patient: Kristin Chavez  [id number]  Jul 18, 1971   Date of visit: 08/23/23  Diagnosis-iron deficiency anemia  Chief complaint/ Reason for visit-routine follow-up of iron deficiency anemia  Heme/Onc history: patient is a 52 year old female with a past medical history significant for hypertension hyperlipidemia who has been referred for iron deficiency anemia.  CBC from 06/15/2023 showed white count of 7.2, H&H of 7.6/27.5 with an MCV of 66 and a platelet count of 432.  Of note patient has had chronic microcytic anemia with a hemoglobin that has been mostly between 7.5-8.5 over the last 3 years and chronic microcytosis.  Her last colonoscopy was in 2017 by Dr. Marva Panda which showed a 4 mm polyp in the proximal ascending colon which was resected and was negative for malignancy.  No recent iron studies checked.   Given her history of Menorrhagia patient was referred to GYN.  She underwent pelvic ultrasound the results of which are pending.  She was seen by GI and underwent EGD and colonoscopy the results of which I do not have with me.  It did not show any evidence of bleeding or malignancy.  Patient received 2 doses of Feraheme in September 2024  Interval history-energy level somewhat improved after receiving IV iron.  She did report some increased bruising over her forearms while moving furniture after receiving IV iron which is gradually receding  ECOG PS- 0 Pain scale- 0   Review of systems- Review of Systems  Constitutional:  Negative for chills, fever, malaise/fatigue and weight loss.  HENT:  Negative for congestion, ear discharge and nosebleeds.   Eyes:  Negative for blurred vision.  Respiratory:  Negative for cough, hemoptysis,  sputum production, shortness of breath and wheezing.   Cardiovascular:  Negative for chest pain, palpitations, orthopnea and claudication.  Gastrointestinal:  Negative for abdominal pain, blood in stool, constipation, diarrhea, heartburn, melena, nausea and vomiting.  Genitourinary:  Negative for dysuria, flank pain, frequency, hematuria and urgency.  Musculoskeletal:  Negative for back pain, joint pain and myalgias.  Skin:  Negative for rash.  Neurological:  Negative for dizziness, tingling, focal weakness, seizures, weakness and headaches.  Endo/Heme/Allergies:  Does not bruise/bleed easily.  Psychiatric/Behavioral:  Negative for depression and suicidal ideas. The patient does not have insomnia.       No Known Allergies   Past Medical History:  Diagnosis Date   Diabetes mellitus without complication (HCC)    Hypertension    Hypothyroidism    Liver disease, chronic    Thyroid disease    Thyroid disease      Past Surgical History:  Procedure Laterality Date   CESAREAN SECTION     COLONOSCOPY WITH PROPOFOL N/A 04/01/2016   Procedure: COLONOSCOPY WITH PROPOFOL;  Surgeon: Christena Deem, MD;  Location: [facility] ENDOSCOPY;  Service: Endoscopy;  Laterality: N/A;   Hx of laparoscopy  1998    Social History   Socioeconomic History   Marital status: Married    Spouse name: Not on file   Number of children: Not on file   Years of education: Not on file   Highest education level: Not on file  Occupational History   Not on file  Tobacco Use   Smoking status: Never   Smokeless tobacco: Never  Substance and Sexual  Activity   Alcohol use: No   Drug use: No   Sexual activity: Yes    Birth control/protection: Other-see comments  Other Topics Concern   Not on file  Social History Narrative   Not on file   Social Determinants of Health   Financial Resource Strain: Not on file  Food Insecurity: Not on file  Transportation Needs: Not on file  Physical Activity: Not on file   Stress: Not on file  Social Connections: Not on file  Intimate Partner Violence: Not on file    Family History  Problem Relation Age of Onset   Hypertension Mother    Cancer Paternal Grandmother    Ovarian cancer Paternal Grandmother    Breast cancer Paternal Aunt    Uterine cancer Paternal Aunt    Breast cancer Maternal Great-grandmother      Current Outpatient Medications:    amLODipine (NORVASC) 5 MG tablet, TAKE 1 TABLET BY MOUTH DAILY. TAKE IN ADDITION TO OTHER BP MEDICATION, HYZAAR (LOSARTAN-HCTZ), Disp: 90 tablet, Rfl: 1   dicyclomine (BENTYL) 20 MG tablet, Take 1 tablet (20 mg total) by mouth 3 (three) times daily as needed (for cramps)., Disp: 90 tablet, Rfl: 5   levothyroxine (SYNTHROID) 150 MCG tablet, TAKE 1 TABLET (150 MCG TOTAL) BY MOUTH DAILY. REPEAT LABS PRIOR TO COMPLETION., Disp: 90 tablet, Rfl: 0   losartan-hydrochlorothiazide (HYZAAR) 100-25 MG tablet, TAKE 1 TABLET BY MOUTH EVERY DAY, Disp: 90 tablet, Rfl: 0   metFORMIN (GLUCOPHAGE) 500 MG tablet, TAKE 2 TABLETS BY MOUTH EVERY DAY WITH BREAKFAST, Disp: 180 tablet, Rfl: 1   Omega-3 Fatty Acids (FISH OIL OMEGA-3 PO), Take by mouth., Disp: , Rfl:    Probiotic Product (PROBIOTIC DAILY PO), Take by mouth., Disp: , Rfl:    terbinafine (LAMISIL) 250 MG tablet, Take 1 tablet (250 mg total) by mouth daily. (Patient not taking: Reported on 08/23/2023), Disp: 100 tablet, Rfl: 0   tirzepatide (MOUNJARO) 12.5 MG/0.5ML Pen, Inject 12.5 mg into the skin once a week., Disp: 6 mL, Rfl: 4   Vitamin D, Ergocalciferol, (DRISDOL) 1.25 MG (50000 UNIT) CAPS capsule, Take 1 capsule (50,000 Units total) by mouth every 7 (seven) days. Repeat labs in 3 months; best taken with meal., Disp: 13 capsule, Rfl: 0  Physical exam:  Vitals:   08/23/23 1536  BP: 131/83  Pulse: 66  Resp: 18  Temp: 98 F (36.7 C)  TempSrc: Tympanic  SpO2: 100%  Weight: 222 lb 3.2 oz (100.8 kg)  Height: 5\' 6"  (1.676 m)   Physical Exam Cardiovascular:      Rate and Rhythm: Normal rate and regular rhythm.     Heart sounds: Normal heart sounds.  Pulmonary:     Effort: Pulmonary effort is normal.  Skin:    General: Skin is warm and dry.  Neurological:     Mental Status: She is alert and oriented to person, place, and time.         Latest Ref Rng & Units 03/17/2023    3:21 PM  CMP  Glucose 70 - 99 mg/dL 161   BUN 6 - 24 mg/dL 17   Creatinine 0.96 - 1.00 mg/dL 0.45   Sodium 409 - 811 mmol/L 138   Potassium 3.5 - 5.2 mmol/L 4.0   Chloride 96 - 106 mmol/L 100   CO2 20 - 29 mmol/L 25   Calcium 8.7 - 10.2 mg/dL 9.8   Total Protein 6.0 - 8.5 g/dL 7.5   Total Bilirubin 0.0 - 1.2 mg/dL 0.4  Alkaline Phos 44 - 121 IU/L 61   AST 0 - 40 IU/L 13   ALT 0 - 32 IU/L 12       Latest Ref Rng & Units 08/23/2023    3:10 PM  CBC  WBC 4.0 - 10.5 K/uL 6.9   Hemoglobin 12.0 - 15.0 g/dL 16.1   Hematocrit 09.6 - 46.0 % 34.9   Platelets 150 - 400 K/uL 257       Assessment and plan- Patient is a 52 y.o. female here for routine follow-up of iron deficiency anemia possibly secondary to menorrhagia  Patient's hemoglobin is improved from 8-11.1 after receiving IV iron.  Microcytosis is better from 63-80.  Iron studies are still pending at this time.  Based on that we will decide if she needs more IV iron or not.  She is seeing GYN for further management of menorrhagia.  She was also seen by GI Dr. Norma Fredrickson and will be scheduled for EGD and colonoscopy.  We will call her with the results of today's blood work.  I will repeat CBC ferritin and iron studies in 3 and 6 months and see her back in 6 months   Visit Diagnosis 1. Other iron deficiency anemia      Dr. Owens Shark, MD, MPH [facility] [phone] 08/23/2023 3:38 PM

## 2023-08-29 ENCOUNTER — Encounter: Payer: Self-pay | Admitting: Obstetrics and Gynecology

## 2023-09-11 NOTE — Progress Notes (Unsigned)
    GYNECOLOGY PROGRESS NOTE  Subjective:    Patient ID: Kristin Chavez, female    DOB: 09/15/1971, 52 y.o.   MRN: [id number]  HPI  Patient is a 52 y.o. No obstetric history on file. female who presents for   {Common ambulatory SmartLinks:19316}  Review of Systems {ros; complete:30496}   Objective:   There were no vitals taken for this visit. There is no height or weight on file to calculate BMI. General appearance: {general exam:16600} Abdomen: {abdominal exam:16834} Pelvic: {pelvic exam:16852::"cervix normal in appearance","external genitalia normal","no adnexal masses or tenderness","no cervical motion tenderness","rectovaginal septum normal","uterus normal size, shape, and consistency","vagina normal without discharge"} Extremities: {extremity exam:5109} Neurologic: {neuro exam:17854}   Assessment:   No diagnosis found.   Plan:   There are no diagnoses linked to this encounter.

## 2023-09-12 ENCOUNTER — Encounter: Payer: Self-pay | Admitting: Obstetrics and Gynecology

## 2023-09-12 ENCOUNTER — Other Ambulatory Visit (HOSPITAL_COMMUNITY)
Admission: RE | Admit: 2023-09-12 | Discharge: 2023-09-12 | Disposition: A | Discharge status: Home / Self Care | Payer: BC Managed Care – PPO | Source: Ambulatory Visit | Attending: Obstetrics and Gynecology | Admitting: Obstetrics and Gynecology

## 2023-09-12 ENCOUNTER — Ambulatory Visit: Payer: BC Managed Care – PPO | Admitting: Obstetrics and Gynecology

## 2023-09-12 VITALS — BP 126/76 | HR 75 | Resp 16 | Ht 66.0 in | Wt 214.9 lb

## 2023-09-12 DIAGNOSIS — D500 Iron deficiency anemia secondary to blood loss (chronic): Secondary | ICD-10-CM

## 2023-09-12 DIAGNOSIS — Z09 Encounter for follow-up examination after completed treatment for conditions other than malignant neoplasm: Secondary | ICD-10-CM | POA: Diagnosis not present

## 2023-09-12 DIAGNOSIS — D251 Intramural leiomyoma of uterus: Secondary | ICD-10-CM | POA: Diagnosis not present

## 2023-09-12 DIAGNOSIS — D250 Submucous leiomyoma of uterus: Secondary | ICD-10-CM

## 2023-09-12 DIAGNOSIS — Z30430 Encounter for insertion of intrauterine contraceptive device: Secondary | ICD-10-CM | POA: Diagnosis not present

## 2023-09-12 DIAGNOSIS — N858 Other specified noninflammatory disorders of uterus: Secondary | ICD-10-CM

## 2023-09-12 DIAGNOSIS — N920 Excessive and frequent menstruation with regular cycle: Secondary | ICD-10-CM | POA: Insufficient documentation

## 2023-09-12 DIAGNOSIS — Z809 Family history of malignant neoplasm, unspecified: Secondary | ICD-10-CM

## 2023-09-12 MED ORDER — LEVONORGESTREL 20.1 MCG/DAY IU IUD
1.0000 | INTRAUTERINE_SYSTEM | Freq: Once | INTRAUTERINE | Status: AC
  Administered 2023-09-12: 1 via INTRAUTERINE

## 2023-09-12 NOTE — Patient Instructions (Signed)
IUD PLACEMENT POST-PROCEDURE INSTRUCTIONS  You may take Ibuprofen, Aleve or Tylenol for pain if needed.  Cramping should resolve within in 24 hours.  You may have a small amount of spotting.  You should wear a mini pad for the next few days.  You may have intercourse after 48 hours.  If you using this for birth control, it is effective immediately.  You need to call if you have any pelvic pain, fever, heavy bleeding or foul smelling vaginal discharge.  Irregular bleeding is common the first several months after having an IUD placed. You do not need to call for this reason unless you are concerned.  Shower or bathe as normal  You should have a follow-up appointment in 4-6 weeks for a re-check to make sure you are not having any problems.

## 2023-09-12 NOTE — Addendum Note (Signed)
Addended by: Fabian November on: 09/12/2023 10:15 PM   Modules accepted: Level of Service

## 2023-09-13 ENCOUNTER — Other Ambulatory Visit: Payer: Self-pay | Admitting: Family Medicine

## 2023-09-13 DIAGNOSIS — E559 Vitamin D deficiency, unspecified: Secondary | ICD-10-CM

## 2023-09-15 LAB — SURGICAL PATHOLOGY

## 2023-09-15 NOTE — Telephone Encounter (Signed)
Requested medication (s) are due for refill today: yes   Requested medication (s) are on the active medication list: yes   Last refill:  06/16/23 #13 0 refills   Future visit scheduled: no   Notes to clinic:  protocol route to provider. Do you want to continue refills?     Requested Prescriptions  Pending Prescriptions Disp Refills   Vitamin D, Ergocalciferol, (DRISDOL) 1.25 MG (50000 UNIT) CAPS capsule [Pharmacy Med Name: VITAMIN D2 1.25MG (50,000 UNIT)] 13 capsule 0    Sig: TAKE 1 CAPSULE BY MOUTH EVERY 7 (SEVEN) DAYS. REPEAT LABS IN 3 MONTHS BEST TAKEN WITH MEAL.     Endocrinology:  Vitamins - Vitamin D Supplementation 2 Failed - 09/13/2023  8:33 AM      Failed - Manual Review: Route requests for 50,000 IU strength to the provider      Passed - Ca in normal range and within 360 days    Calcium  Date Value Ref Range Status  03/17/2023 9.8 8.7 - 10.2 mg/dL Final         Passed - Vitamin D in normal range and within 360 days    25-Hydroxy, Vitamin D-3  Date Value Ref Range Status  10/14/2021 20 ng/mL Final    Comment:    This test was developed and its performance characteristics determined by [facility]. It has not been cleared or approved by the Food and Drug Administration.    25-Hydroxy, Vitamin D-2  Date Value Ref Range Status  10/14/2021 6.2 ng/mL Final    Comment:    This test was developed and its performance characteristics determined by [facility]. It has not been cleared or approved by the Food and Drug Administration.    25-Hydroxy, Vitamin D  Date Value Ref Range Status  10/14/2021 26 (L) ng/mL Final    Comment:    Reference Range: All Ages: Target levels 30 - 100    Vit D, 25-Hydroxy  Date Value Ref Range Status  03/17/2023 48.9 30.0 - 100.0 ng/mL Final    Comment:    Vitamin D deficiency has been defined by the Institute of Medicine and an Endocrine Society practice guideline as a level of serum 25-OH vitamin D less than 20 ng/mL (1,2). The Endocrine  Society went on to further define vitamin D insufficiency as a level between 21 and 29 ng/mL (2). 1. IOM (Institute of Medicine). 2010. Dietary reference    intakes for calcium and D. [place]: The    [employer]. 2. Holick MF, Binkley Leming, Bischoff-Ferrari HA, et al.    Evaluation, treatment, and prevention of vitamin D    deficiency: an Endocrine Society clinical practice    guideline. JCEM. 2011 Jul; 96(7):1911-30.          Passed - Valid encounter within last 12 months    Recent Outpatient Visits           6 months ago Annual physical exam   [facility] Jacky Kindle, FNP   12 months ago Need for COVID-19 vaccine   [facility] Jacky Kindle, FNP   1 year ago Morbid obesity [facility])   [facility] Merita Norton T, FNP   1 year ago Type 2 diabetes mellitus with hyperglycemia, without long-term current use of insulin [facility])   [place] [facility] Merita Norton T, FNP   1 year ago Type 2 diabetes mellitus without complication, without long-term current use of insulin (HCC)  [facility] Jacky Kindle, [place]

## 2023-10-16 ENCOUNTER — Other Ambulatory Visit: Payer: Self-pay | Admitting: Nurse Practitioner

## 2023-11-08 ENCOUNTER — Ambulatory Visit: Payer: BC Managed Care – PPO | Admitting: Obstetrics and Gynecology

## 2023-11-08 ENCOUNTER — Encounter: Payer: Self-pay | Admitting: Obstetrics and Gynecology

## 2023-11-08 VITALS — BP 131/71 | HR 67 | Resp 16 | Ht 66.0 in | Wt 213.6 lb

## 2023-11-08 DIAGNOSIS — D500 Iron deficiency anemia secondary to blood loss (chronic): Secondary | ICD-10-CM

## 2023-11-08 DIAGNOSIS — Z30431 Encounter for routine checking of intrauterine contraceptive device: Secondary | ICD-10-CM | POA: Diagnosis not present

## 2023-11-08 NOTE — Progress Notes (Signed)
    GYNECOLOGY OFFICE ENCOUNTER NOTE  History:  52 y.o. G1P1001 here today for today for IUD string check; Liletta  IUD was placed  09/12/2023. Placed for perimenopausal menorrhagia with fibroid uterus, and chronic anemia. No complaints about the IUD, no concerning side effects.  Does report that she bled most of this first month, essentially daily, however the bleeding was light.  Notes that over the past few days it has finally begun to slow.   The following portions of the patient's history were reviewed and updated as appropriate: allergies, current medications, past family history, past medical history, past social history, past surgical history and problem list. Last pap smear on 04/16/2023 was normal, negative HRHPV.  Review of Systems:  Pertinent items are noted in HPI.  Objective:  Physical Exam Blood pressure 131/71, pulse 67, resp. rate 16, height 5\' 6"  (1.676 m), weight 213 lb 9.6 oz (96.9 kg). CONSTITUTIONAL: Well-developed, well-nourished female in no acute distress.  NEUROLOGIC: Alert and oriented to person, place, and time. Normal reflexes, muscle tone coordination.  ABDOMEN: Soft, no distention noted.   PELVIC: Normal appearing external genitalia; normal appearing vaginal mucosa and cervix.  Scant thin tan discharge in vault. IUD strings visualized, about 3 cm in length outside cervix.  EXTREMITIES: Non-tender, no edema or cyanosis   Labs:     Latest Ref Rng & Units 08/23/2023    3:10 PM 06/23/2023    1:51 PM 06/15/2023    2:09 PM  CBC  WBC 4.0 - 10.5 K/uL 6.9  6.9  7.2   Hemoglobin 12.0 - 15.0 g/dL 11.9  8.0  7.6   Hematocrit 36.0 - 46.0 % 34.9  27.9  27.5   Platelets 150 - 400 K/uL 257  435  432     Lab Results  Component Value Date   IRON 37 08/23/2023   TIBC 330 08/23/2023   FERRITIN 21 08/23/2023     Assessment & Plan:  - Patient to keep IUD in place for up to 8 years; can come in for removal if she desires pregnancy earlier or for any concerning side  effects. - Patient notes that she has an appointment with Hematologist early next month to check labs, but the visits are expensive. Requests labs to be performed here today.  Will order f/u iron studies.  Also discussed utilization of oral iron supplements as last labs showed improvement after iron infusion.    Hildred Laser, MD [facility]

## 2023-11-09 ENCOUNTER — Telehealth: Payer: Self-pay | Admitting: Oncology

## 2023-11-09 LAB — IRON,TIBC AND FERRITIN PANEL
Ferritin: 16 ng/mL (ref 15–150)
Iron Saturation: 15 % (ref 15–55)
Iron: 51 ug/dL (ref 27–159)
Total Iron Binding Capacity: 345 ug/dL (ref 250–450)
UIBC: 294 ug/dL (ref 131–425)

## 2023-11-09 LAB — CBC
Hematocrit: 38 % (ref 34.0–46.6)
Hemoglobin: 12.6 g/dL (ref 11.1–15.9)
MCH: 29 pg (ref 26.6–33.0)
MCHC: 33.2 g/dL (ref 31.5–35.7)
MCV: 88 fL (ref 79–97)
Platelets: 267 10*3/uL (ref 150–450)
RBC: 4.34 x10E6/uL (ref 3.77–5.28)
RDW: 13.1 % (ref 11.7–15.4)
WBC: 6.6 10*3/uL (ref 3.4–10.8)

## 2023-11-09 NOTE — Telephone Encounter (Signed)
Patient got labs form ob and requested to cancel appointments with Dr. Smith Robert. Appointments cancelled as requested

## 2023-11-13 ENCOUNTER — Encounter: Payer: Self-pay | Admitting: Obstetrics and Gynecology

## 2023-11-17 ENCOUNTER — Telehealth: Payer: Self-pay | Admitting: Physician Assistant

## 2023-11-17 DIAGNOSIS — E034 Atrophy of thyroid (acquired): Secondary | ICD-10-CM

## 2023-11-17 MED ORDER — LOSARTAN POTASSIUM-HCTZ 100-25 MG PO TABS: 1.0000 | ORAL_TABLET | Freq: Every day | ORAL | 0 refills | Status: DC

## 2023-11-17 MED ORDER — LEVOTHYROXINE SODIUM 150 MCG PO TABS: 150.0000 ug | ORAL_TABLET | Freq: Every day | ORAL | 0 refills | Status: DC

## 2023-11-17 NOTE — Telephone Encounter (Signed)
 [facility] is requesting refill levothyroxine  (SYNTHROID ) 150 MCG tablet   Please advise

## 2023-11-17 NOTE — Telephone Encounter (Signed)
 [facility] pharmacy is requesting refill losartan -hydrochlorothiazide  (HYZAAR) 100-25 MG tablet  Please advise

## 2023-11-23 ENCOUNTER — Other Ambulatory Visit: Payer: BC Managed Care – PPO

## 2024-01-26 ENCOUNTER — Telehealth: Payer: Self-pay | Admitting: Family Medicine

## 2024-01-26 DIAGNOSIS — I152 Hypertension secondary to endocrine disorders: Secondary | ICD-10-CM

## 2024-01-26 NOTE — Telephone Encounter (Signed)
[facility] faxed refill request for the following medications:   amLODipine (NORVASC) 5 MG tablet   Please advise.  

## 2024-01-29 MED ORDER — AMLODIPINE BESYLATE 5 MG PO TABS
5.0000 mg | ORAL_TABLET | Freq: Every day | ORAL | 1 refills | Status: DC
Start: 2024-01-29 — End: 2024-06-06

## 2024-02-20 ENCOUNTER — Other Ambulatory Visit: Payer: BC Managed Care – PPO

## 2024-02-20 ENCOUNTER — Ambulatory Visit: Payer: BC Managed Care – PPO | Admitting: Oncology

## 2024-02-21 ENCOUNTER — Ambulatory Visit: Payer: BC Managed Care – PPO | Admitting: Oncology

## 2024-02-21 ENCOUNTER — Other Ambulatory Visit: Payer: BC Managed Care – PPO

## 2024-06-06 ENCOUNTER — Ambulatory Visit: Admitting: Family Medicine

## 2024-06-06 VITALS — BP 126/73 | HR 76 | Ht 66.0 in | Wt 210.6 lb

## 2024-06-06 DIAGNOSIS — E785 Hyperlipidemia, unspecified: Secondary | ICD-10-CM

## 2024-06-06 DIAGNOSIS — E034 Atrophy of thyroid (acquired): Secondary | ICD-10-CM

## 2024-06-06 DIAGNOSIS — E1169 Type 2 diabetes mellitus with other specified complication: Secondary | ICD-10-CM | POA: Diagnosis not present

## 2024-06-06 DIAGNOSIS — Z7984 Long term (current) use of oral hypoglycemic drugs: Secondary | ICD-10-CM

## 2024-06-06 DIAGNOSIS — E1165 Type 2 diabetes mellitus with hyperglycemia: Secondary | ICD-10-CM

## 2024-06-06 DIAGNOSIS — E1159 Type 2 diabetes mellitus with other circulatory complications: Secondary | ICD-10-CM

## 2024-06-06 DIAGNOSIS — I152 Hypertension secondary to endocrine disorders: Secondary | ICD-10-CM

## 2024-06-06 DIAGNOSIS — E559 Vitamin D deficiency, unspecified: Secondary | ICD-10-CM | POA: Diagnosis not present

## 2024-06-06 DIAGNOSIS — Z23 Encounter for immunization: Secondary | ICD-10-CM | POA: Diagnosis not present

## 2024-06-06 DIAGNOSIS — D508 Other iron deficiency anemias: Secondary | ICD-10-CM

## 2024-06-06 DIAGNOSIS — Z1231 Encounter for screening mammogram for malignant neoplasm of breast: Secondary | ICD-10-CM

## 2024-06-06 DIAGNOSIS — K760 Fatty (change of) liver, not elsewhere classified: Secondary | ICD-10-CM

## 2024-06-06 MED ORDER — TIRZEPATIDE 15 MG/0.5ML ~~LOC~~ SOAJ: 15.0000 mg | SUBCUTANEOUS | 2 refills | Status: DC

## 2024-06-06 MED ORDER — LOSARTAN POTASSIUM-HCTZ 100-25 MG PO TABS: 1.0000 | ORAL_TABLET | Freq: Every day | ORAL | 2 refills | Status: AC

## 2024-06-06 MED ORDER — AMLODIPINE BESYLATE 5 MG PO TABS
5.0000 mg | ORAL_TABLET | Freq: Every day | ORAL | 2 refills | Status: AC
Start: 2024-06-06 — End: ?

## 2024-06-06 MED ORDER — LEVOTHYROXINE SODIUM 150 MCG PO TABS
150.0000 ug | ORAL_TABLET | Freq: Every day | ORAL | 2 refills | Status: DC
Start: 2024-06-06 — End: 2024-06-07

## 2024-06-06 NOTE — Assessment & Plan Note (Signed)
 Chronic condition Hypertension is well controlled with current medication regimen. Blood pressure is within goal today. - Continue losartan -hydrochlorothiazide  100-25 mg daily - Continue amlodipine  5 mg daily - Monitor for symptoms of hypotension such as lightheadedness or dizziness - If blood pressure becomes too low, consider discontinuing amlodipine 

## 2024-06-06 NOTE — Assessment & Plan Note (Signed)
 Vitamin D  deficiency Chronic  Vitamin D  deficiency managed with weekly supplementation. - Continue weekly vitamin D  supplementation - Order vitamin D  level to assess current status

## 2024-06-06 NOTE — Assessment & Plan Note (Signed)
 Hyperlipidemia Chronic condition Awaiting cholesterol levels to assess current status. The 10-year ASCVD risk score (Arnett DK, et al., 2019) is: 4.2%  - lipid panel ordered  - continue omega three supplement
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
## 2024-06-06 NOTE — Assessment & Plan Note (Signed)
 Type 2 diabetes mellitus with hyperglycemia Chronic  Type 2 diabetes with hyperglycemia, currently managed with Mounjaro  12.5 mg weekly. Blood pressure is well controlled. Awaiting A1c results to assess glycemic control. - Increase Mounjaro  to 15 mg weekly to address weight plateau and improve glycemic control - Order A1c - Order urine albumin and creatinine to monitor renal function - patient to scheduled updated eye exam  - continue losartan   - no current statin, pt on omega 3 supplement - Administered flu vaccine today

## 2024-06-06 NOTE — Assessment & Plan Note (Signed)
 Chronic condition Iron deficiency anemia with borderline iron levels according to recent GYN labs. Awaiting CBC results to assess current status. Previously received iron infusions in 2024  - Order CBC - Consider iron infusion with heme if levels are low

## 2024-06-06 NOTE — Assessment & Plan Note (Signed)
  Hypothyroidism Hypothyroidism managed with Synthroid  150 mcg daily. - Continue Synthroid  150 mcg daily - Order TSH, T4, and T3 to assess thyroid  function

## 2024-06-06 NOTE — Assessment & Plan Note (Signed)
 Fatty liver disease with comprehensive metabolic panel (CMP) ordered to assess liver function. - Order CMP

## 2024-06-07 ENCOUNTER — Ambulatory Visit: Payer: Self-pay | Admitting: Family Medicine

## 2024-06-07 DIAGNOSIS — E034 Atrophy of thyroid (acquired): Secondary | ICD-10-CM

## 2024-06-07 LAB — CMP14+EGFR
ALT: 10 IU/L (ref 0–32)
AST: 14 IU/L (ref 0–40)
Albumin: 4.2 g/dL (ref 3.8–4.9)
Alkaline Phosphatase: 39 IU/L — ABNORMAL LOW (ref 44–121)
BUN/Creatinine Ratio: 20 (ref 9–23)
BUN: 14 mg/dL (ref 6–24)
Bilirubin Total: 0.7 mg/dL (ref 0.0–1.2)
CO2: 24 mmol/L (ref 20–29)
Calcium: 9.1 mg/dL (ref 8.7–10.2)
Chloride: 103 mmol/L (ref 96–106)
Creatinine, Ser: 0.7 mg/dL (ref 0.57–1.00)
Globulin, Total: 2.3 g/dL (ref 1.5–4.5)
Glucose: 110 mg/dL — ABNORMAL HIGH (ref 70–99)
Potassium: 4.4 mmol/L (ref 3.5–5.2)
Sodium: 141 mmol/L (ref 134–144)
Total Protein: 6.5 g/dL (ref 6.0–8.5)
eGFR: 103 mL/min/1.73 (ref >59)

## 2024-06-07 LAB — LIPID PANEL
Chol/HDL Ratio: 3.3 ratio (ref 0.0–4.4)
Cholesterol, Total: 153 mg/dL (ref 100–199)
HDL: 46 mg/dL (ref >39)
LDL Chol Calc (NIH): 92 mg/dL (ref 0–99)
Triglycerides: 80 mg/dL (ref 0–149)
VLDL Cholesterol Cal: 15 mg/dL (ref 5–40)

## 2024-06-07 LAB — HEMOGLOBIN A1C
Est. average glucose Bld gHb Est-mCnc: 117 mg/dL
Hgb A1c MFr Bld: 5.7 % — ABNORMAL HIGH (ref 4.8–5.6)

## 2024-06-07 LAB — CBC
Hematocrit: 41.5 % (ref 34.0–46.6)
Hemoglobin: 13.3 g/dL (ref 11.1–15.9)
MCH: 31 pg (ref 26.6–33.0)
MCHC: 32 g/dL (ref 31.5–35.7)
MCV: 97 fL (ref 79–97)
Platelets: 242 x10E3/uL (ref 150–450)
RBC: 4.29 x10E6/uL (ref 3.77–5.28)
RDW: 12.7 % (ref 11.7–15.4)
WBC: 6.6 x10E3/uL (ref 3.4–10.8)

## 2024-06-07 LAB — TSH+T4F+T3FREE
Free T4: 1.35 ng/dL (ref 0.82–1.77)
T3, Free: 2 pg/mL (ref 2.0–4.4)
TSH: 9.99 u[IU]/mL — ABNORMAL HIGH (ref 0.450–4.500)

## 2024-06-07 LAB — IRON,TIBC AND FERRITIN PANEL
Ferritin: 23 ng/mL (ref 15–150)
Iron Saturation: 19 % (ref 15–55)
Iron: 56 ug/dL (ref 27–159)
Total Iron Binding Capacity: 294 ug/dL (ref 250–450)
UIBC: 238 ug/dL (ref 131–425)

## 2024-06-07 LAB — MICROALBUMIN / CREATININE URINE RATIO
Creatinine, Urine: 168.1 mg/dL
Microalb/Creat Ratio: 22 mg/g{creat} (ref 0–29)
Microalbumin, Urine: 37.3 ug/mL

## 2024-06-07 LAB — VITAMIN D 25 HYDROXY (VIT D DEFICIENCY, FRACTURES): Vit D, 25-Hydroxy: 36.5 ng/mL (ref 30.0–100.0)

## 2024-06-07 MED ORDER — LEVOTHYROXINE SODIUM 175 MCG PO TABS: 175.0000 ug | ORAL_TABLET | Freq: Every day | ORAL | 3 refills | Status: AC

## 2024-06-12 NOTE — Progress Notes (Signed)
 Seen by patient Kristin Chavez on 06/08/2024  8:45 AM

## 2024-06-18 LAB — HM DIABETES EYE EXAM

## 2024-06-26 ENCOUNTER — Ambulatory Visit
Admission: RE | Admit: 2024-06-26 | Discharge: 2024-06-26 | Disposition: A | Discharge status: Home / Self Care | Source: Ambulatory Visit | Attending: Family Medicine | Admitting: Family Medicine

## 2024-06-26 DIAGNOSIS — Z1231 Encounter for screening mammogram for malignant neoplasm of breast: Secondary | ICD-10-CM | POA: Insufficient documentation

## 2024-06-28 ENCOUNTER — Other Ambulatory Visit: Payer: Self-pay | Admitting: Family Medicine

## 2024-06-28 DIAGNOSIS — R928 Other abnormal and inconclusive findings on diagnostic imaging of breast: Secondary | ICD-10-CM

## 2024-07-02 ENCOUNTER — Ambulatory Visit
Admission: RE | Admit: 2024-07-02 | Discharge: 2024-07-02 | Disposition: A | Discharge status: Home / Self Care | Source: Ambulatory Visit | Attending: Family Medicine | Admitting: Family Medicine

## 2024-07-02 DIAGNOSIS — R928 Other abnormal and inconclusive findings on diagnostic imaging of breast: Secondary | ICD-10-CM

## 2024-07-02 DIAGNOSIS — R92322 Mammographic fibroglandular density, left breast: Secondary | ICD-10-CM | POA: Diagnosis not present

## 2024-08-26 ENCOUNTER — Encounter: Admitting: Family Medicine

## 2024-08-26 ENCOUNTER — Other Ambulatory Visit: Payer: Self-pay | Admitting: Family Medicine

## 2024-08-26 DIAGNOSIS — E559 Vitamin D deficiency, unspecified: Secondary | ICD-10-CM

## 2024-09-03 ENCOUNTER — Encounter: Payer: Self-pay | Admitting: Family Medicine

## 2024-09-03 ENCOUNTER — Ambulatory Visit: Admitting: Family Medicine

## 2024-09-03 VITALS — BP 117/70 | HR 75 | Ht 66.0 in | Wt 207.6 lb

## 2024-09-03 DIAGNOSIS — I152 Hypertension secondary to endocrine disorders: Secondary | ICD-10-CM

## 2024-09-03 DIAGNOSIS — E034 Atrophy of thyroid (acquired): Secondary | ICD-10-CM

## 2024-09-03 DIAGNOSIS — K760 Fatty (change of) liver, not elsewhere classified: Secondary | ICD-10-CM

## 2024-09-03 DIAGNOSIS — E1165 Type 2 diabetes mellitus with hyperglycemia: Secondary | ICD-10-CM

## 2024-09-03 DIAGNOSIS — E1169 Type 2 diabetes mellitus with other specified complication: Secondary | ICD-10-CM

## 2024-09-03 DIAGNOSIS — D508 Other iron deficiency anemias: Secondary | ICD-10-CM

## 2024-09-03 DIAGNOSIS — E559 Vitamin D deficiency, unspecified: Secondary | ICD-10-CM

## 2024-09-03 DIAGNOSIS — Z23 Encounter for immunization: Secondary | ICD-10-CM

## 2024-09-03 NOTE — Patient Instructions (Signed)
 To keep you healthy, please keep in mind the following health maintenance items that you are due for:   Health Maintenance Due  Topic Date Due   DTaP/Tdap/Td (1 - Tdap) Never done   Hepatitis B Vaccines 19-59 Average Risk (1 of 3 - 19+ 3-dose series) Never done   Zoster Vaccines- Shingrix (1 of 2) Never done   Pneumococcal Vaccine: 50+ Years (2 of 2 - PCV) 08/27/2020     Best Wishes,   Dr. Lang

## 2024-09-03 NOTE — Progress Notes (Signed)
 Established patient visit   Patient: Kristin Chavez   DOB: [birth year]   53 y.o. Female  MRN: [id number] Visit Date: 09/03/2024  Today's healthcare provider: Rockie Agent, MD   Chief Complaint  Patient presents with   Medical Management of Chronic Issues    Patient is present ofr follow up with PCP, doing well    Subjective     HPI     Medical Management of Chronic Issues    Additional comments: Patient is present ofr follow up with PCP, doing well       Last edited by Cherry Chiquita HERO, CMA on 09/03/2024  3:25 PM.       Discussed the use of AI scribe software for clinical note transcription with the patient, who gave verbal consent to proceed.  History of Present Illness Kristin Chavez is a 53 year old female with type 2 diabetes, metabolic dysfunction, and hypothyroidism who presents for chronic condition follow-up.  She is here for a follow-up of her chronic conditions, including type 2 diabetes, metabolic dysfunction, steatohepatitis, hypertension, hypothyroidism, iron deficiency anemia, hyperlipidemia, vitamin D  deficiency, and class 1 obesity.  Her most recent A1c was 5.7% three months ago. She continues to take metformin  1000 mg daily and Mounjaro  15 mg weekly. She has lost weight from 310 lbs to 210 lbs since January, although her weight has plateaued recently. She aims to get below 200 lbs and is actively engaging in physical activity and dietary management.  Her TSH was elevated at 9.9 with normal T4 and T3 levels, and her Synthroid  dose was recently changed to 175 mcg daily. She has been adherent to the new dosage and feels well, although she had missed some doses previously.  Her blood pressure was 117/70 mmHg. She continues to take amlodipine  5 mg daily and losartan  100 mg with hydrochlorothiazide  25 mg daily.  Her cholesterol was within goal range at her last check. Her vitamin D  level was 36.5, and she has been taking vitamin D  supplements  weekly. Her iron studies are normal, and she does not report any symptoms of anemia.  She engages in regular physical activity, walking at least four days a week, and is considering increasing her exercise to achieve further weight loss. She uses [employer]itness[employer] to track her diet, aiming for around 1200 calories daily, focusing on high protein intake. She feels well overall and is grateful for the improvements in her health.  No issues with nausea or appetite changes and she is sleeping well.     Past Medical History:  Diagnosis Date   Diabetes mellitus without complication (HCC)    Hypertension    Hypothyroidism    Liver disease, chronic    Thyroid  disease    Thyroid  disease     Medications: Outpatient Medications Prior to Visit  Medication Sig   amLODipine  (NORVASC ) 5 MG tablet Take 1 tablet (5 mg total) by mouth daily.   levothyroxine  (SYNTHROID ) 175 MCG tablet Take 1 tablet (175 mcg total) by mouth daily.   losartan -hydrochlorothiazide  (HYZAAR) 100-25 MG tablet Take 1 tablet by mouth daily.   Omega-3 Fatty Acids (FISH OIL OMEGA-3 PO) Take by mouth.   Probiotic Product (PROBIOTIC DAILY PO) Take by mouth.   tirzepatide  (MOUNJARO ) 15 MG/0.5ML Pen Inject 15 mg into the skin once a week.   Vitamin D , Ergocalciferol , (DRISDOL ) 1.25 MG (50000 UNIT) CAPS capsule TAKE 1 CAPSULE BY MOUTH EVERY 7 (SEVEN) DAYS. REPEAT LABS IN 3 MONTHS BEST TAKEN WITH MEAL.  metFORMIN  (GLUCOPHAGE ) 500 MG tablet TAKE 2 TABLETS BY MOUTH EVERY DAY WITH BREAKFAST (Patient not taking: Reported on 09/03/2024)   No facility-administered medications prior to visit.    Review of Systems  Last CBC Lab Results  Component Value Date   WBC 6.6 06/06/2024   HGB 13.3 06/06/2024   HCT 41.5 06/06/2024   MCV 97 06/06/2024   MCH 31.0 06/06/2024   RDW 12.7 06/06/2024   PLT 242 06/06/2024   Last metabolic panel Lab Results  Component Value Date   GLUCOSE 110 (H) 06/06/2024   NA 141 06/06/2024   K 4.4 06/06/2024    CL 103 06/06/2024   CO2 24 06/06/2024   BUN 14 06/06/2024   CREATININE 0.70 06/06/2024   EGFR 103 06/06/2024   CALCIUM 9.1 06/06/2024   PROT 6.5 06/06/2024   ALBUMIN 4.2 06/06/2024   LABGLOB 2.3 06/06/2024   AGRATIO 1.6 03/17/2023   BILITOT 0.7 06/06/2024   ALKPHOS 39 (L) 06/06/2024   AST 14 06/06/2024   ALT 10 06/06/2024   ANIONGAP 11 08/31/2019   Last lipids Lab Results  Component Value Date   CHOL 153 06/06/2024   HDL 46 06/06/2024   LDLCALC 92 06/06/2024   TRIG 80 06/06/2024   CHOLHDL 3.3 06/06/2024   Last hemoglobin A1c Lab Results  Component Value Date   HGBA1C 5.7 (H) 06/06/2024   Last thyroid  functions Lab Results  Component Value Date   TSH 9.990 (H) 06/06/2024   T4TOTAL 7.1 11/12/2020   FREET4 1.35 06/06/2024   Last vitamin D  Lab Results  Component Value Date   25OHVITD2 6.2 10/14/2021   25OHVITD3 20 10/14/2021   VD25OH 36.5 06/06/2024   Last vitamin B12 and Folate Lab Results  Component Value Date   VITAMINB12 222 06/23/2023   FOLATE 7.6 06/23/2023        Objective    BP 117/70 (BP Location: Right Arm, Patient Position: Sitting, Cuff Size: Normal)   Pulse 75   Ht 5' 6 (1.676 m)   Wt 207 lb 9.6 oz (94.2 kg)   LMP 07/10/2024 (Approximate)   SpO2 100%   BMI 33.51 kg/m  BP Readings from Last 3 Encounters:  09/03/24 117/70  06/06/24 126/73  11/08/23 131/71   Wt Readings from Last 3 Encounters:  09/03/24 207 lb 9.6 oz (94.2 kg)  06/06/24 210 lb 9.6 oz (95.5 kg)  11/08/23 213 lb 9.6 oz (96.9 kg)      Ideal body weight: 59.3 kg (130 lb 11.7 oz) Adjusted ideal body weight: 73.2 kg (161 lb 7.7 oz)   Physical Exam Vitals reviewed.  Constitutional:      General: She is not in acute distress.    Appearance: Normal appearance. She is not ill-appearing.  Cardiovascular:     Rate and Rhythm: Normal rate and regular rhythm.  Pulmonary:     Effort: Pulmonary effort is normal. No respiratory distress.     Breath sounds: No wheezing,  rhonchi or rales.  Neurological:     Mental Status: She is alert and oriented to person, place, and time.  Psychiatric:        Mood and Affect: Mood normal.        Behavior: Behavior normal.       No results found for any visits on 09/03/24.  Assessment & Plan     Problem List Items Addressed This Visit     Avitaminosis D   Hyperlipidemia associated with type 2 diabetes mellitus (HCC)   Hypertension associated with  diabetes (HCC)   Hypothyroidism due to acquired atrophy of thyroid    Relevant Orders   TSH + free T4   Iron deficiency anemia   Metabolic dysfunction-associated steatotic liver disease (MASLD)   Morbid obesity (HCC)   Type 2 diabetes mellitus with hyperglycemia, without long-term current use of insulin  (HCC) - Primary   Other Visit Diagnoses       Immunization due       Relevant Orders   Pneumococcal conjugate vaccine 20-valent (Prevnar 20) (Completed)   Tdap vaccine greater than or equal to 7yo IM (Completed)       Assessment and Plan Assessment & Plan Chronic Condition Follow-up Chronic conditions are well-managed with current medications. Blood pressure is well-controlled at 117/70 mmHg. A1c is well-controlled at 5.7%. Cholesterol levels are within goal range. Vitamin D  levels are within goal range. Iron studies are normal. - Continue amlodipine  5 mg daily for hypertension - Continue losartan  100 mg daily - Continue metformin  1000 mg daily for diabetes - Continue Mounjaro  15 mg weekly - Continue vitamin D  supplementation - Administered tetanus booster - Administered pneumococcal vaccine  Type 2 Diabetes Mellitus Chronic well controlled Type 2 diabetes is well-controlled with an A1c of 5.7%. - Continue metformin  1000 mg daily - Continue Mounjaro  15 mg weekly  Hypertension Chronic  Well-controlled with current medication regimen. Blood pressure is 117/70 mmHg. - Continue amlodipine  5 mg daily - Continue losartan  100 mg  daily  Hypothyroidism Chronic Managed with Synthroid . TSH was elevated at 9.9 with normal T4 and T3. Synthroid  dose was adjusted to 175 mcg daily. - Continue Synthroid  175 mcg daily - Ordered TSH and T4 tests to assess response to dose adjustment  Fatty Liver Disease Chronic  Fatty liver disease is associated with obesity and diabetes. Weight loss efforts are ongoing with Mounjaro  and lifestyle modifications. - Continue weight management efforts with Mounjaro  and lifestyle modifications  Obesity Chronic  Class 1 obesity is associated with liver disease, diabetes, and hypertension. Weight has decreased from 310 lbs to 210 lbs since January. Current weight is 200.4 lbs. She is aiming for a weight below 200 lbs. Exercise and dietary modifications are ongoing. - Continue Mounjaro  15 mg weekly - Encouraged increased aerobic exercise to 200-300 minutes per week - Encouraged resistance training at least twice a week - Encouraged dietary modifications to maintain a calorie deficit  Hyperlipidemia Chronic  Associated with diabetes. Cholesterol levels are within goal range. - Continue current management  Vitamin D  Deficiency Chronic  Vitamin D  levels are within goal range with current supplementation. - Continue vitamin D  supplementation  Iron Deficiency Anemia Chronic,improved  Iron studies are normal, indicating resolution of iron deficiency anemia.  General Health Maintenance Discussed vaccinations including Shingrix, pneumococcal, and tetanus. Shingrix is recommended for those over 50. Pneumococcal vaccine is recommended for those with diabetes. Tetanus booster is recommended every 10 years. - Administered tetanus booster - Administered pneumococcal vaccine - Discussed Shingrix vaccine and will consider administration at a later date     Return in about 5 months (around 02/01/2025) for CPE.         Rockie Agent, MD  [facility] [phone] (phone) [phone] (fax)  [facility]

## 2024-09-04 LAB — TSH+FREE T4
Free T4: 1.75 ng/dL (ref 0.82–1.77)
TSH: 1.29 u[IU]/mL (ref 0.450–4.500)

## 2024-09-10 ENCOUNTER — Ambulatory Visit: Payer: Self-pay | Admitting: Family Medicine

## 2024-09-16 ENCOUNTER — Other Ambulatory Visit: Payer: Self-pay | Admitting: Family Medicine

## 2024-09-16 DIAGNOSIS — E1165 Type 2 diabetes mellitus with hyperglycemia: Secondary | ICD-10-CM

## 2024-09-18 ENCOUNTER — Telehealth: Payer: Self-pay

## 2024-09-18 ENCOUNTER — Encounter: Payer: Self-pay | Admitting: Oncology

## 2024-09-18 ENCOUNTER — Other Ambulatory Visit (HOSPITAL_COMMUNITY): Payer: Self-pay

## 2024-09-18 NOTE — Telephone Encounter (Signed)
 Pharmacy Patient Advocate Encounter  Received notification from [employer] that Prior Authorization for Mounjaro  15MG /0.5ML auto-injectors has been APPROVED from 12.10.25 to 12.10.26. Ran test claim, . This test claim was processed through [facility]- copay amounts may vary at other pharmacies due to pharmacy/plan contracts, or as the patient moves through the different stages of their insurance plan.   PA #/Case ID/Reference #: [id number]

## 2024-09-18 NOTE — Telephone Encounter (Signed)
 Pharmacy Patient Advocate Encounter   Received notification from Onbase that prior authorization for Mounjaro  15MG /0.5ML auto-injectors  is required/requested.   Insurance verification completed.   The patient is insured through [employer].   Per test claim: PA required; PA submitted to above mentioned insurance via Latent Key/confirmation #/EOC [id number] Status is pending

## 2025-02-04 ENCOUNTER — Encounter: Admitting: Family Medicine
